# Patient Record
Sex: Male | Born: 1940
Health system: Southern US, Community
[De-identification: ages and names within clinical notes are randomized; demographics above are authoritative.]

## PROBLEM LIST (undated history)

## (undated) DIAGNOSIS — I4430 Unspecified atrioventricular block: Secondary | ICD-10-CM

## (undated) DIAGNOSIS — C61 Malignant neoplasm of prostate: Secondary | ICD-10-CM

## (undated) DIAGNOSIS — F0390 Unspecified dementia without behavioral disturbance: Secondary | ICD-10-CM

## (undated) DIAGNOSIS — R5383 Other fatigue: Secondary | ICD-10-CM

## (undated) DIAGNOSIS — Z8719 Personal history of other diseases of the digestive system: Secondary | ICD-10-CM

## (undated) DIAGNOSIS — R000 Tachycardia, unspecified: Secondary | ICD-10-CM

## (undated) DIAGNOSIS — C7951 Secondary malignant neoplasm of bone: Secondary | ICD-10-CM

## (undated) DIAGNOSIS — K2210 Ulcer of esophagus without bleeding: Secondary | ICD-10-CM

## (undated) DIAGNOSIS — I471 Supraventricular tachycardia: Secondary | ICD-10-CM

## (undated) DIAGNOSIS — I10 Essential (primary) hypertension: Secondary | ICD-10-CM

## (undated) DIAGNOSIS — I452 Bifascicular block: Secondary | ICD-10-CM

## (undated) DIAGNOSIS — F419 Anxiety disorder, unspecified: Secondary | ICD-10-CM

## (undated) DIAGNOSIS — E785 Hyperlipidemia, unspecified: Secondary | ICD-10-CM

## (undated) HISTORY — PX: RESECTION BONE TUMOR RIB: SHX2331

## (undated) HISTORY — DX: Secondary malignant neoplasm of bone: C79.51

## (undated) HISTORY — DX: Hyperlipidemia, unspecified: E78.5

## (undated) HISTORY — DX: Malignant neoplasm of prostate: C61

## (undated) HISTORY — PX: DENTAL SURGERY: SHX609

---

## 2010-11-05 ENCOUNTER — Encounter (INDEPENDENT_AMBULATORY_CARE_PROVIDER_SITE_OTHER): Payer: Medicare Other | Admitting: Thoracic Surgery

## 2010-11-05 DIAGNOSIS — D480 Neoplasm of uncertain behavior of bone and articular cartilage: Secondary | ICD-10-CM

## 2010-11-06 NOTE — Letter (Signed)
November 05, 2010  Selinda Flavin, MD [address] [place], [address] [place], [place] [address]  Re:  [name] [name], [name] [name]             DOB:  [birth year]  Dear Dr. Caryn Bee:  I saw the patient in the office today.  This is a 70 year old Caucasian male, noticed a mass in his right axilla approximately a month ago, and CT scan showed a fourth rib mass that encased the fourth rib with no rib destruction.  It measured 17 x 12 mm and extended into the space.  There was some sclerosis on the rib at that area.  It was thought it could be benign tumor versus a malignant neoplasm.  He has had no weight loss. No excessive sputum.  PAST MEDICAL HISTORY:  Significant for ganglionectomy.  He had previous atrial tachycardia, then had a previous cardiac cath, which showed he has had some mild blockages.  MEDICATIONS:  Aspirin, lisinopril, Zocor, atenolol.  Penicillin causes him to have a rash.  FAMILY HISTORY:  Noncontributory.  SOCIAL HISTORY:  He is married.  He has two children.  Quit smoking in 1976.  Does not drink alcohol on a regular basis.  REVIEW OF SYSTEMS:  GENERAL:  He is 162 pounds.  He is 5 feet 8 inches. He has had some mild weight loss and loss of appetite. CARDIAC:  See history of present illness. PULMONARY:  He has had a cough. GI:  He has had no abdominal pain, dysphagia, or reflux. GU:  He has had frequent urination.  No kidney disease. VASCULAR:  No claudication, DVT, TIAs. NEUROLOGIC:  No dizziness, headaches, blackouts, seizures. MUSCULOSKELETAL:  No arthritis. PSYCHIATRIC:  No depression or nervousness. EYE/ENT:  No changes in eyesight or hearing. HEMATOLOGIC:  No problems with bleeding, clotting disorders, or anemia.  PHYSICAL EXAMINATION:  GENERAL:  He is a thin Caucasian male in no acute distress.  VITAL SIGNS:  Blood pressure 102/66, pulse 84, respirations 18, saturation 96%.  HEAD, EYES, EARS, NOSE, AND THROAT:  Unremarkable. NECK:  Supple without thyromegaly.  There is no  supraclavicular axillary adenopathy.  CHEST:  Clear to auscultation and percussion.  HEART: Regular sinus rhythm.  No murmurs.  ABDOMEN:  Soft.  There is no hepatosplenomegaly.  On examination of his chest and his right arm, there is a palpable 2-3 cm mass at the anterior axillary line on the fourth rib.  I have discussed this with the patient and although we could do a needle biopsy of this it is obviously that this is already through the chest wall and grown in to the pleura, and I would just recommend resection of this and probably require partial resection of his third, fourth, and fifth ribs and then reconstruction with an allograft.  He is going to decide if he wants to have this done and gives a call back.  I appreciate the opportunity of seeing this patient.  Ines Bloomer, M.D. Electronically Signed  DPB/MEDQ  D:  11/05/2010  T:  11/06/2010  Job:  [id number]

## 2010-11-11 ENCOUNTER — Other Ambulatory Visit: Payer: Self-pay | Admitting: Thoracic Surgery

## 2010-11-11 ENCOUNTER — Encounter (HOSPITAL_COMMUNITY)
Admission: RE | Admit: 2010-11-11 | Discharge: 2010-11-11 | Disposition: A | Discharge status: Home / Self Care | Payer: Medicare Other | Source: Ambulatory Visit | Attending: Thoracic Surgery | Admitting: Thoracic Surgery

## 2010-11-11 ENCOUNTER — Ambulatory Visit (HOSPITAL_COMMUNITY)
Admission: RE | Admit: 2010-11-11 | Discharge: 2010-11-11 | Disposition: A | Discharge status: Home / Self Care | Payer: Medicare Other | Source: Ambulatory Visit | Attending: Thoracic Surgery | Admitting: Thoracic Surgery

## 2010-11-11 ENCOUNTER — Ambulatory Visit (INDEPENDENT_AMBULATORY_CARE_PROVIDER_SITE_OTHER): Payer: Medicare Other | Admitting: Thoracic Surgery

## 2010-11-11 DIAGNOSIS — D492 Neoplasm of unspecified behavior of bone, soft tissue, and skin: Secondary | ICD-10-CM

## 2010-11-11 DIAGNOSIS — Z01812 Encounter for preprocedural laboratory examination: Secondary | ICD-10-CM | POA: Insufficient documentation

## 2010-11-11 DIAGNOSIS — Z01818 Encounter for other preprocedural examination: Secondary | ICD-10-CM | POA: Insufficient documentation

## 2010-11-11 DIAGNOSIS — Z01810 Encounter for preprocedural cardiovascular examination: Secondary | ICD-10-CM | POA: Insufficient documentation

## 2010-11-11 DIAGNOSIS — D167 Benign neoplasm of ribs, sternum and clavicle: Secondary | ICD-10-CM

## 2010-11-11 LAB — CBC
HCT: 38 % — ABNORMAL LOW (ref 39.0–52.0)
Platelets: 251 10*3/uL (ref 150–400)
RBC: 4.36 MIL/uL (ref 4.22–5.81)
RDW: 13.2 % (ref 11.5–15.5)
WBC: 8.5 10*3/uL (ref 4.0–10.5)

## 2010-11-11 LAB — COMPREHENSIVE METABOLIC PANEL
AST: 22 U/L (ref 0–37)
CO2: 22 mEq/L (ref 19–32)
Calcium: 9.8 mg/dL (ref 8.4–10.5)
Chloride: 102 mEq/L (ref 96–112)
Creatinine, Ser: 0.72 mg/dL (ref 0.50–1.35)
GFR calc Af Amer: >60 mL/min (ref >60)
GFR calc non Af Amer: >60 mL/min (ref >60)
Glucose, Bld: 92 mg/dL (ref 70–99)
Total Bilirubin: 0.5 mg/dL (ref 0.3–1.2)

## 2010-11-11 LAB — URINALYSIS, ROUTINE W REFLEX MICROSCOPIC
Glucose, UA: NEGATIVE mg/dL
Ketones, ur: NEGATIVE mg/dL
Leukocytes, UA: NEGATIVE
Nitrite: NEGATIVE
Protein, ur: NEGATIVE mg/dL
Urobilinogen, UA: 0.2 mg/dL (ref 0.0–1.0)

## 2010-11-11 LAB — BLOOD GAS, ARTERIAL
Drawn by: 206361
TCO2: 26.5 mmol/L (ref 0–100)
pCO2 arterial: 37.5 mmHg (ref 35.0–45.0)
pH, Arterial: 7.445 (ref 7.350–7.450)

## 2010-11-11 LAB — ABO/RH: ABO/RH(D): O POS

## 2010-11-11 LAB — SURGICAL PCR SCREEN: Staphylococcus aureus: POSITIVE — AB

## 2010-11-11 LAB — PROTIME-INR: Prothrombin Time: 12.8 seconds (ref 11.6–15.2)

## 2010-11-12 NOTE — Assessment & Plan Note (Signed)
OFFICE VISIT  [name] [name], [name] [name]. DOB:  1940-10-14                                        November 11, 2010 CHART #:  [id number]  [name] [name] returned today.  We have him scheduled for resection of the chest wall tumor on Thursday.  He was seen here  because of right ingrown toenail and he does have some mild drainage and some tenderness over his right first toe on the lateral aspect.  No cellulitis and no frank pus in that area.  I discussed it with he and his family.  We will just go ahead and remove the ingrown toenail at the time of his regular surgery.  His blood pressure is 101/66, pulse 58, respirations 16, sats were 93%.  Ines Bloomer, M.D. Electronically Signed  DPB/MEDQ  D:  11/11/2010  T:  11/12/2010  Job:  [id number]

## 2010-11-13 ENCOUNTER — Inpatient Hospital Stay (HOSPITAL_COMMUNITY)
Admission: RE | Admit: 2010-11-13 | Discharge: 2010-11-19 | DRG: 517 | Disposition: A | Discharge status: Home / Self Care | Payer: Medicare Other | Source: Ambulatory Visit | Attending: Thoracic Surgery | Admitting: Thoracic Surgery

## 2010-11-13 ENCOUNTER — Other Ambulatory Visit: Payer: Self-pay | Admitting: Thoracic Surgery

## 2010-11-13 ENCOUNTER — Inpatient Hospital Stay (HOSPITAL_COMMUNITY): Payer: Medicare Other

## 2010-11-13 DIAGNOSIS — C61 Malignant neoplasm of prostate: Secondary | ICD-10-CM | POA: Diagnosis present

## 2010-11-13 DIAGNOSIS — L600 Ingrowing nail: Secondary | ICD-10-CM

## 2010-11-13 DIAGNOSIS — C7951 Secondary malignant neoplasm of bone: Secondary | ICD-10-CM | POA: Insufficient documentation

## 2010-11-13 DIAGNOSIS — Z7982 Long term (current) use of aspirin: Secondary | ICD-10-CM

## 2010-11-13 DIAGNOSIS — E785 Hyperlipidemia, unspecified: Secondary | ICD-10-CM | POA: Diagnosis present

## 2010-11-13 DIAGNOSIS — I2510 Atherosclerotic heart disease of native coronary artery without angina pectoris: Secondary | ICD-10-CM | POA: Diagnosis present

## 2010-11-13 DIAGNOSIS — C7952 Secondary malignant neoplasm of bone marrow: Principal | ICD-10-CM | POA: Diagnosis present

## 2010-11-13 DIAGNOSIS — C413 Malignant neoplasm of ribs, sternum and clavicle: Secondary | ICD-10-CM

## 2010-11-13 DIAGNOSIS — Z79899 Other long term (current) drug therapy: Secondary | ICD-10-CM

## 2010-11-13 DIAGNOSIS — I10 Essential (primary) hypertension: Secondary | ICD-10-CM | POA: Diagnosis present

## 2010-11-13 HISTORY — DX: Essential (primary) hypertension: I10

## 2010-11-13 HISTORY — DX: Secondary malignant neoplasm of bone: C79.51

## 2010-11-14 ENCOUNTER — Inpatient Hospital Stay (HOSPITAL_COMMUNITY): Payer: Medicare Other

## 2010-11-14 LAB — POCT I-STAT 3, ART BLOOD GAS (G3+)
Acid-Base Excess: 1 mmol/L (ref 0.0–2.0)
Bicarbonate: 26.9 mEq/L — ABNORMAL HIGH (ref 20.0–24.0)
O2 Saturation: 92 %
Patient temperature: 98
pO2, Arterial: 64 mmHg — ABNORMAL LOW (ref 80.0–100.0)

## 2010-11-14 LAB — BASIC METABOLIC PANEL
BUN: 9 mg/dL (ref 6–23)
Calcium: 8.2 mg/dL — ABNORMAL LOW (ref 8.4–10.5)
GFR calc Af Amer: >60 mL/min (ref >60)
GFR calc non Af Amer: >60 mL/min (ref >60)
Glucose, Bld: 128 mg/dL — ABNORMAL HIGH (ref 70–99)

## 2010-11-14 LAB — CBC
HCT: 37 % — ABNORMAL LOW (ref 39.0–52.0)
Hemoglobin: 12.4 g/dL — ABNORMAL LOW (ref 13.0–17.0)
MCH: 29.7 pg (ref 26.0–34.0)
MCHC: 33.5 g/dL (ref 30.0–36.0)
RDW: 13.6 % (ref 11.5–15.5)

## 2010-11-14 LAB — TYPE AND SCREEN
ABO/RH(D): O POS
Unit division: 0

## 2010-11-15 ENCOUNTER — Inpatient Hospital Stay (HOSPITAL_COMMUNITY): Payer: Medicare Other

## 2010-11-15 LAB — CBC
MCH: 29.8 pg (ref 26.0–34.0)
MCHC: 33.6 g/dL (ref 30.0–36.0)
Platelets: 193 10*3/uL (ref 150–400)
RDW: 13.4 % (ref 11.5–15.5)

## 2010-11-15 LAB — COMPREHENSIVE METABOLIC PANEL
ALT: 16 U/L (ref 0–53)
AST: 31 U/L (ref 0–37)
Albumin: 2.7 g/dL — ABNORMAL LOW (ref 3.5–5.2)
Calcium: 6.8 mg/dL — ABNORMAL LOW (ref 8.4–10.5)
GFR calc Af Amer: >60 mL/min (ref >60)
Potassium: 5.7 mEq/L — ABNORMAL HIGH (ref 3.5–5.1)
Sodium: 133 mEq/L — ABNORMAL LOW (ref 135–145)
Total Protein: 5.6 g/dL — ABNORMAL LOW (ref 6.0–8.3)

## 2010-11-16 ENCOUNTER — Inpatient Hospital Stay (HOSPITAL_COMMUNITY): Payer: Medicare Other

## 2010-11-16 LAB — BASIC METABOLIC PANEL
BUN: 16 mg/dL (ref 6–23)
Creatinine, Ser: 0.92 mg/dL (ref 0.50–1.35)
GFR calc non Af Amer: >60 mL/min (ref >60)
Glucose, Bld: 126 mg/dL — ABNORMAL HIGH (ref 70–99)
Potassium: 4.1 mEq/L (ref 3.5–5.1)

## 2010-11-16 LAB — CBC
HCT: 36.3 % — ABNORMAL LOW (ref 39.0–52.0)
Hemoglobin: 12.3 g/dL — ABNORMAL LOW (ref 13.0–17.0)
MCH: 29.6 pg (ref 26.0–34.0)
MCHC: 33.9 g/dL (ref 30.0–36.0)
MCV: 87.5 fL (ref 78.0–100.0)

## 2010-11-17 ENCOUNTER — Inpatient Hospital Stay (HOSPITAL_COMMUNITY): Payer: Medicare Other

## 2010-11-17 LAB — CBC
HCT: 35.5 % — ABNORMAL LOW (ref 39.0–52.0)
Hemoglobin: 12 g/dL — ABNORMAL LOW (ref 13.0–17.0)
MCH: 29.6 pg (ref 26.0–34.0)
MCHC: 33.8 g/dL (ref 30.0–36.0)

## 2010-11-17 LAB — PSA: PSA: 187.4 ng/mL — ABNORMAL HIGH (ref <=4.00)

## 2010-11-18 ENCOUNTER — Inpatient Hospital Stay (HOSPITAL_COMMUNITY): Payer: Medicare Other

## 2010-11-18 LAB — CBC
Hemoglobin: 12 g/dL — ABNORMAL LOW (ref 13.0–17.0)
MCH: 29.4 pg (ref 26.0–34.0)
RBC: 4.08 MIL/uL — ABNORMAL LOW (ref 4.22–5.81)
WBC: 15 10*3/uL — ABNORMAL HIGH (ref 4.0–10.5)

## 2010-11-18 LAB — GLUCOSE, CAPILLARY: Glucose-Capillary: 99 mg/dL (ref 70–99)

## 2010-11-18 NOTE — Op Note (Signed)
NAMECOURT, [name] NO.:  [id number]  MEDICAL RECORD NO.:  [id number]  LOCATION:  2316                         FACILITY:  [facility]  PHYSICIAN:  [name] [name], M.D. DATE OF BIRTH:  [birth year]  DATE OF PROCEDURE: DATE OF DISCHARGE:                              OPERATIVE REPORT   PREOPERATIVE DIAGNOSIS:  Right fourth rib tumor.  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  OPERATION PERFORMED:  Right video-assisted thoracic surgery, resection of third, fourth and fifth ribs with reconstruction with allograft and titanium plates.  SURGEON:  [name] Bloomer, MD  FIRST ASSISTANT:  [name] Clack, PA-C  ANESTHESIA:  General anesthesia.  After percutaneous insertion of all monitoring lines, the patient underwent general anesthesia, was prepped and draped in usual sterile manner.  He was turned to the right lateral thoracotomy position.  The right lung was deflated.  Two trocar sites were made in the anterior and posterior axons to seventh intercostal space.  Two trocars were inserted.  A 0-degree scope was inserted and we could see the lesion in the fourth rib at the anterior axillary line.  We marked this and then made incision over the rib approximately 10 cm.  The scope was removed. We then dissected down to the subcutaneous tissue and elevated superior and inferior flap with electrocautery.  The latissimus was preserved but the serratus muscle was over the tumor and we resected it portion of the muscle inferiorly and superiorly, medially and laterally.  We then dissected up to the third rib and dissected out a portion of the third rib of approximately 3-5 cm.  The fourth rib we dissected an area of approximately 7-8 cm and the same with the fifth rib going into the intercostal space above the third rib and intercostal space below the fourth and fifth rib.  We then resected the ribs medially and laterally leaving a gross  2 cm margin on all areas of the rib.  There was an en bloc resection that was sent for frozen section, was thought to be some type of a possible neurogenic tumor margins.  We then began reconstruction up in the chest tube through the anterior trocar site and tied in place with 0 silk.  We then took a XCM biological allograft and cut it appropriately to be approximately 10 cm in length and approximately 6-7 cm in width.  We then sutured that to the intercostal muscle superiorly and inferiorly and medially and laterally with interrupted horizontal mattress 2-0 Prolene sutures.  After this had been done and the allograft was in place, we then placed on the third rib, we used a Universal plate and 8 mm screws.  We put the plate on to expand the gap and there were 3 screw holes medially and 2 laterally. We held it in place with a lobster claw plate holder and then used to drill the ribs and then used the 8 screw with the power screwdriver.  We did 3 medially and 2 laterally.  On the 2 laterally we were somewhat concerned as it was not a good enough fixation, so we put another stitch of a #5 FiberWire on it and  tied that in place to further hold the plate to the rib.  On the fourth rib, we used a 4 or 5 rib plate and used a 10 blade to cut so it will be 3-4 holes medially and 3 holes laterally. The rib was then cut, again took #8 screws.  We held the rib in place with the lobster claw plate holders.  We then in like manner put 4 screws medially and 3 screws laterally as previously described.  Then we sutured the allograft to the plates with interrupted 2-0 Prolene above for the third and the fourth plates.  Finally we used the fifth rib plate and it with a longest.  We had to use approximately 10 cm again using 3 holes medially and 3 holes laterally.  We measured it and held in place with plate holders and then used the drill guide to drill the holes and then the power screwdrivers to put in #8  screws.  After this had been done, we again sutured the allograft to the plate and then closed the chest by resuturing the serratus with interrupted 0 Prolene, 2-0 Prolene in the subcutaneous tissue and Dermabond for the skin.  The other trocar site was closed with 2-0 Vicryl and Dermabond for the skin. A Marcaine block was done in the usual fashion.  A single On-Q was inserted in the usual fashion.  The patient was then turned to the prone position and left first toe was prepped.  The patient had an infected ingrown toenail in this left first toe causing considerable pain.  After prepping and draping this in a sterile field, we elevated the ingrown toenail from the cuticle and removed approximately one-fourth of the toenail to remove the ingrown toenail.  There was some pus coming out of this and we debrided that.  We then irrigated it copiously and applied a dry dressing.     [name] [name], M.D.     DPB/MEDQ  D:  11/13/2010  T:  11/14/2010  Job:  [id number]  Electronically Signed by Jovita Gamma M.D. on 11/18/2010 04:36:20 PM

## 2010-11-18 NOTE — H&P (Addendum)
NAME:  [name] [name], [name] [name] NO.:  [id number]  MEDICAL RECORD NO.:  [id number]  LOCATION:                                 FACILITY:  PHYSICIAN:  Ines Bloomer, M.D. DATE OF BIRTH:  1940-07-17  DATE OF ADMISSION: DATE OF DISCHARGE:                             HISTORY & PHYSICAL   CHIEF COMPLAINT:  Rib mass.  This 70 year old patient was found to have a mass on his rib palpable in the right axilla near the fourth rib mass that has encased the fourth rib with some sclerosis, but no destruction, it is 17 x 12 mm and extending into the pulmonary space.  As mentioned, there were some sclerosis of the rib.  He noticed this approximately a month ago, it is nontender.  He is being admitted for possible resection.  PAST MEDICAL HISTORY:  Significant for: 1. Right ganglionectomy. 2. He had some atrial tachycardia and had a previous cath by his     cardiologist in [place]. He also has a right first toe ingrown toenail.MEDICATIONS:  Aspirin, lisinopril, Zocor, and atenolol.  ALLERGIES:  PENICILLIN causes him to have a rash.  FAMILY HISTORY:  Noncontributory.  SOCIAL HISTORY:  He is married, has 2 children.  Quit smoking in 1976. Does not drink alcohol on a regular basis.  REVIEW OF SYSTEMS:  CONSTITUTIONAL:  He is 162 pounds.  He is 5 feet 8. He has had some mild weight loss and loss of appetite.  CARDIAC:  See past medical history.  PULMONARY:  He has a cough.  No hemoptysis.  GI: No abdominal pain, dysphagia, or reflux.  GU:  No frequent urination. No kidney disease.  VASCULAR:  No claudication, DVT, TIAs.  NEUROLOGIC: No dizziness, headaches, blackouts, seizures.  MUSCULOSKELETAL:  No arthritis or muscular pain but does have the right first ingrown toenail which he is soaking.  PSYCHIATRIC:  No depression or nervous.  ENT:  No changes in eyesight or hearing.  HEMATOLOGIC:  No problems with bleeding, clotting disorders, or anemia.  PHYSICAL EXAMINATION:  GENERAL:   This is a thin Caucasian male in no acute distress. VITAL SIGNS:  His blood pressure is 102/66, pulse 84, respirations 18, sats were 96%. HEENT:  Head is atraumatic.  Eyes, pupils are equal and reactive to light and accommodation.  Extraocular movements are normal.  Ears, tympanic membranes intact.  Nose, there is no septal deviation. NECK:  Supple without thyromegaly.  There is no supraclavicular or axillary adenopathy. CHEST:  Clear to auscultation and percussion.  Nonpalpable in the right axilla.  You can palpate the anterior axillary line and you can palpate the nodule that is nontender. HEART:  Regular sinus rhythm.  No murmurs. ABDOMEN:  Soft.  There is no hepatosplenomegaly. EXTREMITIES:  Pulses are 2+.  There is no clubbing or edema. NEUROLOGIC:  He is oriented x3.  Sensory and motor intact.  Cranial nerves intact.  IMPRESSION: 1. Right fourth rib, rule out malignant tumor. 2. History of atrial tachycardias. 3. Hypertension. 4. Dyslipidemia. 5. Right ingrown toenail.  PLAN:  Partial resection of right fourth rib with reconstruction with allograft, possible resection of third and fifth ribs, and repair of ingrown toenail.  Ines Bloomer, M.D.     DPB/MEDQ  D:  11/11/2010  T:  11/12/2010  Job:  [id number]  Electronically Signed by Jovita Gamma M.D. on 11/18/2010 04:37:30 PM

## 2010-11-19 ENCOUNTER — Encounter (HOSPITAL_COMMUNITY): Payer: Self-pay | Admitting: Radiology

## 2010-11-19 ENCOUNTER — Inpatient Hospital Stay (HOSPITAL_COMMUNITY): Payer: Medicare Other

## 2010-11-19 MED ORDER — IOHEXOL 300 MG/ML  SOLN
100.0000 mL | Freq: Once | INTRAMUSCULAR | Status: AC | PRN
  Administered 2010-11-19: 100 mL via INTRAVENOUS

## 2010-11-19 MED ORDER — TECHNETIUM TC 99M MEDRONATE IV KIT
25.0000 | PACK | Freq: Once | INTRAVENOUS | Status: AC | PRN
  Administered 2010-11-19: 25 via INTRAVENOUS

## 2010-11-24 ENCOUNTER — Other Ambulatory Visit: Payer: Self-pay | Admitting: Thoracic Surgery

## 2010-11-24 DIAGNOSIS — D381 Neoplasm of uncertain behavior of trachea, bronchus and lung: Secondary | ICD-10-CM

## 2010-11-25 ENCOUNTER — Encounter (INDEPENDENT_AMBULATORY_CARE_PROVIDER_SITE_OTHER): Payer: Self-pay | Admitting: Thoracic Surgery

## 2010-11-25 ENCOUNTER — Ambulatory Visit
Admission: RE | Admit: 2010-11-25 | Discharge: 2010-11-25 | Disposition: A | Discharge status: Home / Self Care | Payer: Medicare Other | Source: Ambulatory Visit | Attending: Thoracic Surgery | Admitting: Thoracic Surgery

## 2010-11-25 DIAGNOSIS — C7951 Secondary malignant neoplasm of bone: Secondary | ICD-10-CM

## 2010-11-25 DIAGNOSIS — D381 Neoplasm of uncertain behavior of trachea, bronchus and lung: Secondary | ICD-10-CM

## 2010-11-25 NOTE — Discharge Summary (Signed)
  [name], [name] NO.:  [id number]  MEDICAL RECORD NO.:  [id number]  LOCATION:  2028                         FACILITY:  [facility]  PHYSICIAN:  Ines Bloomer, M.D. DATE OF BIRTH:  28-Dec-1940  DATE OF ADMISSION:  11/13/2010 DATE OF DISCHARGE:                              DISCHARGE SUMMARY   ADDENDUM  This is an addendum to the patient's discharge summary.  On November 18, 2010, Urology had been consulted for evaluation and Dr. Patsi Sears did see and evaluate the patient.  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  Felt that prior to the patient's discharge to home, would need bone scan and CT scan of the chest, abdomen, and pelvis with contrast for further workup.  He did start the patient on Casodex 50 mg p.o. t.i.d.  Felt that once the patient had these studies done stable for discharge from his standpoint and he plans to follow up with the patient in approximately 10 days.  On morning rounds of November 19, 2010, the patient was noted to be afebrile, normal sinus rhythm.  Blood pressure stable.  O2 sats greater than 90% on room air.  Incisions were noted to be clean, dry, and intact and healing well.  Currently, awaiting for the patient to have bone scan as well as CT scan of pelvis, chest, and abdomen.  Once these studies are done, the plan will be for discharge the patient to home in the AM.  Please see the dictated discharge summary for followup appointments and discharge destructions.  DISCHARGE MEDICATIONS:  As dictated with the addition of Casodex 50 mg p.o. t.i.d.     Sol Blazing, PA   ______________________________ Ines Bloomer, M.D.    KMD/MEDQ  D:  11/19/2010  T:  11/19/2010  Job:  [id number]  cc:   Lynelle Smoke I. Patsi Sears, M.D.  Electronically Signed by Cameron Proud PA on 11/19/2010 09:44:33 AM Electronically Signed by Jovita Gamma M.D. on 11/25/2010 02:59:06 PM

## 2010-11-25 NOTE — Discharge Summary (Signed)
[name], [name] NO.:  [id number]  MEDICAL RECORD NO.:  [id number]  LOCATION:  2028                         FACILITY:  [facility]  PHYSICIAN:  [name] Bloomer, M.D. 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  SECONDARY DIAGNOSES: 1. Hypertension. 2. Hyperlipidemia. 3. Status post right ganglionectomy.  IN-HOSPITAL OPERATIONS AND PROCEDURES:  Right video-assisted thorascopic surgery with right thoracotomy for resection of third, fourth and fifth ribs with reconstruction with allograft and titanium plate.  This was done by Dr. Edwyna Shell on November 13, 2010.  HISTORY AND PHYSICAL AND HOSPITAL COURSE:  The patient is a 70 year old male who was found to have a mass on his ribs.  The mass on the fourth rib measure 17 x 12 mm and extending to the pulmonary space.  There was noted to be some sclerosis of the rib.  The patient was seen and evaluated by Dr. Edwyna Shell.  Dr. Edwyna Shell discussed with the patient undergoing resection of the mass.  Discussed risks and benefits with the patient.  The patient nods understanding and agreed to proceed.  Surgery was scheduled for November 13, 2010.  For further details of the patient's past medical history and physical exam, please see dictated H and P.  The patient was taken to the operating room on November 13, 2010, where he underwent right video-assisted thoracoscopic surgery with right thoracotomy for resection of third, fourth and fifth ribs with reconstruction with allograft and titanium plates.  The patient tolerated this procedure and was transferred to the surgical intensive care unit in stable condition.  Postoperatively, the patient was able to be extubated.  Post extubation, he was noted to be alert and oriented x4.  Neuro intact.  He was  noted to be hemodynamically stable postoperatively.  During the patient's postoperative course, daily chest x-rays were obtained.  He was noted to be stable with no pneumothorax with bilateral atelectasis.  The patient had no air leak noted from chest tube and chest tube was able to be discontinued on postop day #2. The patient's most recent chest x-ray on November 18, 2010, showed a right pleural effusion with atelectasis and infiltrative opacities in the lower right lung.  Plan will be to follow up with a PMI chest x-ray in the a.m.  During this time, the patient has been using his incentive spirometer.  He has been weaned off oxygen with O2 saturations maintaining greater than 90% on room air.  The patient's vital signs have been followed closely.  He has remained afebrile in normal sinus rhythm.  Blood pressure has been stable and low.  He has been restarted on his atenolol but has not been restarted on his lisinopril secondary to his blood pressure being too low to restart.  This can be reevaluated as an outpatient.  The patient postoperatively was up ambulating well with assistance.  He was tolerating diet well.  No nausea, vomiting noted.  All incisions were noted to be clean, dry and intact and healing well.  The patient's pathology  results came back showing metastatic poorly differentiated adenocarcinoma prostatic  primary.  The patient is currently scheduled to have a bone scan done today.  PSA obtained was 187.4 on November 17, 2010.  On November 18, 2010, vital signs noted to be stable.  Most recent lab work shows white blood cell count 15.0, hemoglobin of 12.0, hematocrit 35.0, platelet count 272.  Sodium of 134, potassium 4.1, chloride 98, bicarbonate 31, BUN 16, creatinine 0.92, glucose 126.  The patient is tentatively ready for discharge to home in the a.m. November 19, 2010.  FOLLOWUP APPOINTMENTS:  A followup appointment has been arranged with Dr. Edwyna Shell for November 25, 2010, at 2:15  p.m.  The patient will need to obtain PMI chest x-ray 45 minutes prior to this appointment.  ACTIVITY:  The patient instructed no driving until released to do so, no lifting over 10 pounds.  He is told to ambulate 3-4 times per day, progress as tolerated and continue his breathing exercises.  INCISIONAL CARE:  The patient is told to shower washing his incisions using soap and water.  He is to contact the office if he develops any drainage or opening from any of his incision sites.  DIET:  The patient educated on diet to be low-fat, low-salt.  DISCHARGE MEDICATIONS: 1. Guaifenesin 600 mg b.i.d. 2. Percocet 5/325 1-2 tablets q. 4 h. p.r.n. pain. 3. Triamcinolone applied b.i.d. to affected area. 4. Aspirin 81 mg daily. 5. Atenolol 25 mg daily. 6. Simvastatin 80 mg daily.     [name] Blazing, PA   ______________________________ [name] Bloomer, M.D.    KMD/MEDQ  D:  11/18/2010  T:  11/18/2010  Job:  [id number]  Electronically Signed by Cameron Proud PA on 11/19/2010 09:44:19 AM Electronically Signed by Jovita Gamma M.D. on 11/25/2010 02:59:04 PM

## 2010-11-26 NOTE — Letter (Signed)
November 25, 2010  Selinda Flavin, MD [address] [place], [address] [place], [place] [address]  Re:  LENNON, BOUTWELL.             DOB:  [birth year]  Dear Dr. Dimas Aguas:  I saw Mr. Corbit back after his surgery.  We resected the third, fourth, and fifth ribs and reconstruction with titanium plates and an allograft membrane.  His incision is well healed.  He is doing well overall.  He had a small effusion that is now resolving.  The startling finding of this is that this was metastatic prostate cancer.  We got a bone scan on him.  He has other areas of metastatic cancer and probably some lymph nodes.  I had Dr. Jethro Bolus see him, one of our urologists, and he will initiate treatment for prostate cancer.  It is kind of interesting that we came about the diagnosis in a back way, but he appears to be tolerating the procedure well and hopefully will have a long-term suppression with probable hormonal therapy.  I appreciate the opportunity of seeing Mr. Rouillard.  Sincerely,  Ines Bloomer, M.D. Electronically Signed  DPB/MEDQ  D:  11/25/2010  T:  11/26/2010  Job:  [id number]

## 2010-12-09 NOTE — Consult Note (Signed)
[name] [name], [name] NO.:  [id number]  MEDICAL RECORD NO.:  [id number]  LOCATION:  2028                         FACILITY:  [facility]  PHYSICIAN:  Travares Nelles I. Patsi Sears, M.D.DATE OF BIRTH:  December 01, 1940  DATE OF CONSULTATION:  11/18/2010 DATE OF DISCHARGE:                                CONSULTATION   SUBJECTIVE:  This is a 70 year old married male, who noticed a right red mass palpable near the right axilla, at the level of the fourth anterior rib.  The patient was evaluated with a chest x-ray which showed sclerosis, but no bony destruction.  The mass measured 17 mm x 12 mm, and extended into the pulmonary space.  The mass has been present for approximately 1 month and is nontender.  The patient was admitted via the operating room for resection, with pathology showing prostate cancer microscopically.  Urology consultation is obtained.  DFA this hospitalization is 187 nanograms.  Past medical history is significant for atrial tachycardia.  Medications include aspirin, lisinopril, Zocor, and atenolol.  ALLERGIES:  Penicillin (rash).  SURGERY:  The patient is married with 2 children.  No smoking since 1976.  No alcohol again 30 years.  REVIEW OF SYSTEMS:  CONSTITUTIONAL:  Weight 162 pounds.  Some mild weight loss, some loss of appetite.  CARDIAC:  The patient currently is asymptomatic.  PULMONARY:  History of cough but no hemoptysis.  ABDOMEN: No history of GI complaints.  No dysphagia, reflux, or constipation (until taking pain medication).  GU:  The patient denies gross hematuria.  He has mild frequency, urgency, hesitations to void.  He notices occasional urinary incontinence, with nocturia x2 to 4.  He had a good urinary stream without dribbling, but would not generally complain about his urinary stream.  VASCULAR:  The patient has no claudication, no history of DVT.  NEUROLOGIC:  No dizziness, headaches or seizures.  MUSCULOSKELETAL:  There is no history of  arthritis.  No muscular pain.  PSYCHIATRIC:  No depression or anxiety.  EXTREMITIES: No cyanosis.  PHYSICAL EXAMINATION:  GENERAL:  A thin white male in no acute distress. VITAL SIGNS:  Blood pressure 102/66, pulse 84, respiratory rate 18. NECK:  Supple, nontender.  No nodes. CHEST:  Clear to P and A.  He has an incision which is healing well in his right axilla. ABDOMEN:  Soft plus bowel sounds.  No organomegaly or masses. RECTAL:  Normal sphincter tone.  Prostate is 4+ and firm.  There are no hard nodules.  There is no blood. EXTREMITIES:  No cyanosis.  No edema. PSYCHIATRIC:  Normal orientation.  Sensory and motor nerves are intact.  Laboratories show PSA 187.  Bone scan CT pending.  ASSESSMENT:  Metastatic carcinoma of the prostate with very high PSA. The patient will need to have an evaluation in the office.  In this unusual prostate cancer presentation, workup may be "backwards."  The patient needs to have bone scan and CT while in the hospital, and then I will follow him up in the office.  He will probably need prostate biopsy for Gleason scoring, then hormone therapy in all probability.  I have discussed the situation with him, and I have given him my  phone number to make an appointment with me.  We will see the patient in approximately 10 days.  He is stable for discharge after x- ray is done from urologic standpoint.  He voids well.     [name] I. Patsi Sears, M.D.     SIT/MEDQ  D:  11/18/2010  T:  11/19/2010  Job:  [id number]  cc:   Ines Bloomer, M.D. Laural Golden  Electronically Signed by Jethro Bolus M.D. on 12/09/2010 05:01:16 PM

## 2010-12-17 ENCOUNTER — Other Ambulatory Visit: Payer: Self-pay | Admitting: Thoracic Surgery

## 2010-12-17 DIAGNOSIS — D381 Neoplasm of uncertain behavior of trachea, bronchus and lung: Secondary | ICD-10-CM

## 2010-12-19 DIAGNOSIS — C61 Malignant neoplasm of prostate: Secondary | ICD-10-CM | POA: Insufficient documentation

## 2010-12-19 DIAGNOSIS — I10 Essential (primary) hypertension: Secondary | ICD-10-CM | POA: Insufficient documentation

## 2010-12-19 DIAGNOSIS — E785 Hyperlipidemia, unspecified: Secondary | ICD-10-CM | POA: Insufficient documentation

## 2010-12-19 DIAGNOSIS — C7951 Secondary malignant neoplasm of bone: Secondary | ICD-10-CM

## 2010-12-23 ENCOUNTER — Encounter: Payer: Self-pay | Admitting: Thoracic Surgery

## 2010-12-23 ENCOUNTER — Ambulatory Visit
Admission: RE | Admit: 2010-12-23 | Discharge: 2010-12-23 | Disposition: A | Discharge status: Home / Self Care | Payer: Medicare Other | Source: Ambulatory Visit | Attending: Thoracic Surgery | Admitting: Thoracic Surgery

## 2010-12-23 ENCOUNTER — Ambulatory Visit (INDEPENDENT_AMBULATORY_CARE_PROVIDER_SITE_OTHER): Payer: Self-pay | Admitting: Thoracic Surgery

## 2010-12-23 VITALS — BP 102/68 | HR 88 | Resp 20 | Ht 68.0 in | Wt 163.0 lb

## 2010-12-23 DIAGNOSIS — C419 Malignant neoplasm of bone and articular cartilage, unspecified: Secondary | ICD-10-CM

## 2010-12-23 DIAGNOSIS — D381 Neoplasm of uncertain behavior of trachea, bronchus and lung: Secondary | ICD-10-CM

## 2010-12-23 DIAGNOSIS — Z09 Encounter for follow-up examination after completed treatment for conditions other than malignant neoplasm: Secondary | ICD-10-CM

## 2010-12-23 NOTE — Progress Notes (Signed)
HPI the patient returns after chest wall resection for prostate cancer. Chest x-ray shows that the rib plates were in good position. His incision is well-healed. He has some numbness but minimal pain. He is doing well overall. Dr. Patsi Sears is following him for his prostate cancer.   Current Outpatient Prescriptions  Medication Sig Dispense Refill  . aspirin 81 MG tablet Take 81 mg by mouth daily.        Marland Kitchen atenolol (TENORMIN) 25 MG tablet Take 25 mg by mouth daily.        . bicalutamide (CASODEX) 50 MG tablet Take 50 mg by mouth 3 (three) times daily after meals.        Marland Kitchen oxyCODONE-acetaminophen (PERCOCET) 5-325 MG per tablet Take 1 tablet by mouth every 4 (four) hours as needed.        . simvastatin (ZOCOR) 80 MG tablet Take 80 mg by mouth at bedtime.            Physical Exam  Cardiovascular: Normal rate, regular rhythm and normal heart sounds.   Pulmonary/Chest: Effort normal and breath sounds normal.     Diagnostic tests: Chest x-ray shows normal postoperative changes.   Impression: Status post chest wall resection for prostate cancer  Plan: Return to office in 3 months.

## 2011-03-18 ENCOUNTER — Other Ambulatory Visit: Payer: Self-pay | Admitting: Thoracic Surgery

## 2011-03-18 DIAGNOSIS — Z09 Encounter for follow-up examination after completed treatment for conditions other than malignant neoplasm: Secondary | ICD-10-CM

## 2011-03-18 DIAGNOSIS — C419 Malignant neoplasm of bone and articular cartilage, unspecified: Secondary | ICD-10-CM

## 2011-03-24 ENCOUNTER — Ambulatory Visit (INDEPENDENT_AMBULATORY_CARE_PROVIDER_SITE_OTHER): Payer: Medicare Other | Admitting: Thoracic Surgery

## 2011-03-24 ENCOUNTER — Ambulatory Visit
Admission: RE | Admit: 2011-03-24 | Discharge: 2011-03-24 | Disposition: A | Discharge status: Home / Self Care | Payer: Medicare Other | Source: Ambulatory Visit | Attending: Thoracic Surgery | Admitting: Thoracic Surgery

## 2011-03-24 ENCOUNTER — Encounter: Payer: Self-pay | Admitting: Thoracic Surgery

## 2011-03-24 VITALS — BP 111/72 | HR 82 | Resp 16 | Ht 68.0 in | Wt 165.0 lb

## 2011-03-24 DIAGNOSIS — C419 Malignant neoplasm of bone and articular cartilage, unspecified: Secondary | ICD-10-CM

## 2011-03-24 NOTE — Progress Notes (Signed)
HPI patient returns today for followup. He is being followed by Dr. Patsi Sears. Chest x-ray shows it is plates are in good position. 1 or 2 screws had backed out. He is now on for 6 months since we did the chest wall resection and reconstruction. I will see him again in 4 months and repeat his CT scan. If that is stable then I'll refer him to Dr. Patsi Sears for long-term followup.   Current Outpatient Prescriptions  Medication Sig Dispense Refill  . aspirin 81 MG tablet Take 81 mg by mouth daily.        Marland Kitchen atenolol (TENORMIN) 25 MG tablet Take 25 mg by mouth daily.        . bicalutamide (CASODEX) 50 MG tablet Take 50 mg by mouth 3 (three) times daily after meals.        . simvastatin (ZOCOR) 80 MG tablet Take 80 mg by mouth at bedtime.        Marland Kitchen oxyCODONE-acetaminophen (PERCOCET) 5-325 MG per tablet Take 1 tablet by mouth every 4 (four) hours as needed.           Review of Systems: Unchanged   Physical Exam lungs were clear to auscultation percussion  Diagnostic Tests: Chest x-ray shows the plates are in good position 2 screws have back down slightly   Impression: Status post chest wall resection right third fourth and fifth ribs for metastatic prostate cancer.   Plan: Followup in 4 months with CT scan

## 2011-04-22 DIAGNOSIS — C61 Malignant neoplasm of prostate: Secondary | ICD-10-CM | POA: Diagnosis not present

## 2011-06-10 DIAGNOSIS — I2510 Atherosclerotic heart disease of native coronary artery without angina pectoris: Secondary | ICD-10-CM | POA: Diagnosis not present

## 2011-06-10 DIAGNOSIS — E785 Hyperlipidemia, unspecified: Secondary | ICD-10-CM | POA: Diagnosis not present

## 2011-06-18 DIAGNOSIS — C7951 Secondary malignant neoplasm of bone: Secondary | ICD-10-CM | POA: Diagnosis not present

## 2011-06-18 DIAGNOSIS — C61 Malignant neoplasm of prostate: Secondary | ICD-10-CM | POA: Diagnosis not present

## 2011-06-24 ENCOUNTER — Other Ambulatory Visit: Payer: Self-pay | Admitting: Thoracic Surgery

## 2011-06-24 DIAGNOSIS — D381 Neoplasm of uncertain behavior of trachea, bronchus and lung: Secondary | ICD-10-CM

## 2011-06-25 DIAGNOSIS — C61 Malignant neoplasm of prostate: Secondary | ICD-10-CM | POA: Diagnosis not present

## 2011-06-25 DIAGNOSIS — C7952 Secondary malignant neoplasm of bone marrow: Secondary | ICD-10-CM | POA: Diagnosis not present

## 2011-06-25 DIAGNOSIS — C7951 Secondary malignant neoplasm of bone: Secondary | ICD-10-CM | POA: Diagnosis not present

## 2011-07-23 DIAGNOSIS — C7952 Secondary malignant neoplasm of bone marrow: Secondary | ICD-10-CM | POA: Diagnosis not present

## 2011-07-23 DIAGNOSIS — C7951 Secondary malignant neoplasm of bone: Secondary | ICD-10-CM | POA: Diagnosis not present

## 2011-08-04 ENCOUNTER — Ambulatory Visit (INDEPENDENT_AMBULATORY_CARE_PROVIDER_SITE_OTHER): Payer: Medicare Other | Admitting: Thoracic Surgery

## 2011-08-04 ENCOUNTER — Ambulatory Visit
Admission: RE | Admit: 2011-08-04 | Discharge: 2011-08-04 | Disposition: A | Discharge status: Home / Self Care | Payer: Medicare Other | Source: Ambulatory Visit | Attending: Thoracic Surgery | Admitting: Thoracic Surgery

## 2011-08-04 ENCOUNTER — Encounter: Payer: Self-pay | Admitting: Thoracic Surgery

## 2011-08-04 VITALS — BP 109/70 | HR 85 | Resp 16 | Ht 68.0 in | Wt 170.0 lb

## 2011-08-04 DIAGNOSIS — Z09 Encounter for follow-up examination after completed treatment for conditions other than malignant neoplasm: Secondary | ICD-10-CM

## 2011-08-04 DIAGNOSIS — D381 Neoplasm of uncertain behavior of trachea, bronchus and lung: Secondary | ICD-10-CM

## 2011-08-04 DIAGNOSIS — J438 Other emphysema: Secondary | ICD-10-CM | POA: Diagnosis not present

## 2011-08-04 DIAGNOSIS — C413 Malignant neoplasm of ribs, sternum and clavicle: Secondary | ICD-10-CM

## 2011-08-04 DIAGNOSIS — J984 Other disorders of lung: Secondary | ICD-10-CM | POA: Diagnosis not present

## 2011-08-04 NOTE — Progress Notes (Signed)
HPI patient returns for followup CT scan showed no evidence of recurrence of his metastatic prostate chest wall mass. His incision is well healed and 90 the rib plates were in good position. He's been followed by Dr. Patsi Sears. We will see him back again as needed   Current Outpatient Prescriptions  Medication Sig Dispense Refill  . amitriptyline (ELAVIL) 25 MG tablet Take 25 mg by mouth at bedtime.      Marland Kitchen aspirin 81 MG tablet Take 81 mg by mouth daily.        Marland Kitchen atenolol (TENORMIN) 25 MG tablet Take 25 mg by mouth daily.        . bicalutamide (CASODEX) 50 MG tablet Take 50 mg by mouth 3 (three) times daily after meals.        . simvastatin (ZOCOR) 80 MG tablet Take 80 mg by mouth at bedtime.           Review of Systems: Unchanged   Physical Exam lungs are clear to auscultation percussion   Diagnostic Tests: CT scan showed a recurrent no recurrence of his chest wall metastatic prostate cancer   Impression: Metastatic prostate cancer to the chest chest status post resection and reconstruction   Plan: Return as needed

## 2011-08-20 DIAGNOSIS — C61 Malignant neoplasm of prostate: Secondary | ICD-10-CM | POA: Diagnosis not present

## 2011-08-26 DIAGNOSIS — C61 Malignant neoplasm of prostate: Secondary | ICD-10-CM | POA: Diagnosis not present

## 2011-08-26 DIAGNOSIS — C7951 Secondary malignant neoplasm of bone: Secondary | ICD-10-CM | POA: Diagnosis not present

## 2011-09-24 DIAGNOSIS — C7952 Secondary malignant neoplasm of bone marrow: Secondary | ICD-10-CM | POA: Diagnosis not present

## 2011-09-24 DIAGNOSIS — C7951 Secondary malignant neoplasm of bone: Secondary | ICD-10-CM | POA: Diagnosis not present

## 2011-09-24 DIAGNOSIS — C61 Malignant neoplasm of prostate: Secondary | ICD-10-CM | POA: Diagnosis not present

## 2011-10-23 DIAGNOSIS — C61 Malignant neoplasm of prostate: Secondary | ICD-10-CM | POA: Diagnosis not present

## 2011-10-23 DIAGNOSIS — C7951 Secondary malignant neoplasm of bone: Secondary | ICD-10-CM | POA: Diagnosis not present

## 2011-11-16 DIAGNOSIS — C61 Malignant neoplasm of prostate: Secondary | ICD-10-CM | POA: Diagnosis not present

## 2011-11-20 DIAGNOSIS — C61 Malignant neoplasm of prostate: Secondary | ICD-10-CM | POA: Diagnosis not present

## 2011-11-20 DIAGNOSIS — C7952 Secondary malignant neoplasm of bone marrow: Secondary | ICD-10-CM | POA: Diagnosis not present

## 2011-11-20 DIAGNOSIS — C7951 Secondary malignant neoplasm of bone: Secondary | ICD-10-CM | POA: Diagnosis not present

## 2011-12-22 DIAGNOSIS — C61 Malignant neoplasm of prostate: Secondary | ICD-10-CM | POA: Diagnosis not present

## 2011-12-22 DIAGNOSIS — C7951 Secondary malignant neoplasm of bone: Secondary | ICD-10-CM | POA: Diagnosis not present

## 2011-12-22 DIAGNOSIS — C7952 Secondary malignant neoplasm of bone marrow: Secondary | ICD-10-CM | POA: Diagnosis not present

## 2012-01-19 DIAGNOSIS — C7952 Secondary malignant neoplasm of bone marrow: Secondary | ICD-10-CM | POA: Diagnosis not present

## 2012-01-19 DIAGNOSIS — C7951 Secondary malignant neoplasm of bone: Secondary | ICD-10-CM | POA: Diagnosis not present

## 2012-02-23 DIAGNOSIS — C7951 Secondary malignant neoplasm of bone: Secondary | ICD-10-CM | POA: Diagnosis not present

## 2012-02-23 DIAGNOSIS — C7952 Secondary malignant neoplasm of bone marrow: Secondary | ICD-10-CM | POA: Diagnosis not present

## 2012-02-23 DIAGNOSIS — C61 Malignant neoplasm of prostate: Secondary | ICD-10-CM | POA: Diagnosis not present

## 2012-04-18 DIAGNOSIS — L03221 Cellulitis of neck: Secondary | ICD-10-CM | POA: Diagnosis not present

## 2012-04-18 DIAGNOSIS — L0211 Cutaneous abscess of neck: Secondary | ICD-10-CM | POA: Diagnosis not present

## 2012-04-21 DIAGNOSIS — C61 Malignant neoplasm of prostate: Secondary | ICD-10-CM | POA: Diagnosis not present

## 2012-04-26 DIAGNOSIS — C61 Malignant neoplasm of prostate: Secondary | ICD-10-CM | POA: Diagnosis not present

## 2012-04-26 DIAGNOSIS — C7951 Secondary malignant neoplasm of bone: Secondary | ICD-10-CM | POA: Diagnosis not present

## 2012-04-28 DIAGNOSIS — C61 Malignant neoplasm of prostate: Secondary | ICD-10-CM | POA: Diagnosis not present

## 2012-05-07 DIAGNOSIS — E7800 Pure hypercholesterolemia, unspecified: Secondary | ICD-10-CM | POA: Diagnosis not present

## 2012-05-07 DIAGNOSIS — Z0000 Encounter for general adult medical examination without abnormal findings: Secondary | ICD-10-CM | POA: Diagnosis not present

## 2012-05-07 DIAGNOSIS — L0211 Cutaneous abscess of neck: Secondary | ICD-10-CM | POA: Diagnosis not present

## 2012-05-07 DIAGNOSIS — E039 Hypothyroidism, unspecified: Secondary | ICD-10-CM | POA: Diagnosis not present

## 2012-05-07 DIAGNOSIS — C61 Malignant neoplasm of prostate: Secondary | ICD-10-CM | POA: Diagnosis not present

## 2012-05-07 DIAGNOSIS — E559 Vitamin D deficiency, unspecified: Secondary | ICD-10-CM | POA: Diagnosis not present

## 2012-05-09 DIAGNOSIS — M899 Disorder of bone, unspecified: Secondary | ICD-10-CM | POA: Diagnosis not present

## 2012-05-09 DIAGNOSIS — C801 Malignant (primary) neoplasm, unspecified: Secondary | ICD-10-CM | POA: Diagnosis not present

## 2012-05-09 DIAGNOSIS — C7982 Secondary malignant neoplasm of genital organs: Secondary | ICD-10-CM | POA: Diagnosis not present

## 2012-05-09 DIAGNOSIS — Z8546 Personal history of malignant neoplasm of prostate: Secondary | ICD-10-CM | POA: Diagnosis not present

## 2012-05-09 DIAGNOSIS — R6884 Jaw pain: Secondary | ICD-10-CM | POA: Diagnosis not present

## 2012-05-13 DIAGNOSIS — M899 Disorder of bone, unspecified: Secondary | ICD-10-CM | POA: Diagnosis not present

## 2012-05-13 DIAGNOSIS — Z8546 Personal history of malignant neoplasm of prostate: Secondary | ICD-10-CM | POA: Diagnosis not present

## 2012-05-13 DIAGNOSIS — J3489 Other specified disorders of nose and nasal sinuses: Secondary | ICD-10-CM | POA: Diagnosis not present

## 2012-06-08 DIAGNOSIS — I2510 Atherosclerotic heart disease of native coronary artery without angina pectoris: Secondary | ICD-10-CM | POA: Diagnosis not present

## 2012-06-08 DIAGNOSIS — E785 Hyperlipidemia, unspecified: Secondary | ICD-10-CM | POA: Diagnosis not present

## 2012-06-13 DIAGNOSIS — Z1389 Encounter for screening for other disorder: Secondary | ICD-10-CM | POA: Diagnosis not present

## 2012-06-14 DIAGNOSIS — I2510 Atherosclerotic heart disease of native coronary artery without angina pectoris: Secondary | ICD-10-CM | POA: Diagnosis not present

## 2012-06-14 DIAGNOSIS — E786 Lipoprotein deficiency: Secondary | ICD-10-CM | POA: Diagnosis not present

## 2012-11-01 DIAGNOSIS — C61 Malignant neoplasm of prostate: Secondary | ICD-10-CM | POA: Diagnosis not present

## 2012-11-08 DIAGNOSIS — C61 Malignant neoplasm of prostate: Secondary | ICD-10-CM | POA: Diagnosis not present

## 2012-11-08 DIAGNOSIS — C7951 Secondary malignant neoplasm of bone: Secondary | ICD-10-CM | POA: Diagnosis not present

## 2013-01-27 DIAGNOSIS — C61 Malignant neoplasm of prostate: Secondary | ICD-10-CM | POA: Diagnosis not present

## 2013-02-01 ENCOUNTER — Encounter (HOSPITAL_COMMUNITY): Payer: Self-pay | Admitting: Pharmacy Technician

## 2013-02-01 ENCOUNTER — Other Ambulatory Visit: Payer: Self-pay | Admitting: Urology

## 2013-02-01 DIAGNOSIS — C61 Malignant neoplasm of prostate: Secondary | ICD-10-CM

## 2013-02-02 ENCOUNTER — Other Ambulatory Visit: Payer: Self-pay | Admitting: Radiology

## 2013-02-06 ENCOUNTER — Ambulatory Visit (HOSPITAL_COMMUNITY)
Admission: RE | Admit: 2013-02-06 | Discharge: 2013-02-06 | Disposition: A | Discharge status: Home / Self Care | Payer: Medicare Other | Source: Ambulatory Visit | Attending: Urology | Admitting: Urology

## 2013-02-06 ENCOUNTER — Encounter (HOSPITAL_COMMUNITY): Payer: Self-pay

## 2013-02-06 ENCOUNTER — Other Ambulatory Visit: Payer: Self-pay | Admitting: Urology

## 2013-02-06 DIAGNOSIS — C61 Malignant neoplasm of prostate: Secondary | ICD-10-CM | POA: Diagnosis not present

## 2013-02-06 LAB — BASIC METABOLIC PANEL
BUN: 15 mg/dL (ref 6–23)
CO2: 28 mEq/L (ref 19–32)
Chloride: 98 mEq/L (ref 96–112)
GFR calc Af Amer: >90 mL/min (ref >90)
GFR calc non Af Amer: 85 mL/min — ABNORMAL LOW (ref >90)
Potassium: 3.9 mEq/L (ref 3.5–5.1)
Sodium: 136 mEq/L (ref 135–145)

## 2013-02-06 LAB — CBC
HCT: 38.1 % — ABNORMAL LOW (ref 39.0–52.0)
Hemoglobin: 13 g/dL (ref 13.0–17.0)
MCH: 30.4 pg (ref 26.0–34.0)
MCV: 89 fL (ref 78.0–100.0)
RBC: 4.28 MIL/uL (ref 4.22–5.81)

## 2013-02-06 LAB — PROTIME-INR
INR: 0.97 (ref 0.00–1.49)
Prothrombin Time: 12.7 seconds (ref 11.6–15.2)

## 2013-02-06 MED ORDER — VANCOMYCIN HCL IN DEXTROSE 1-5 GM/200ML-% IV SOLN
1000.0000 mg | Freq: Once | INTRAVENOUS | Status: AC
  Administered 2013-02-06: 1000 mg via INTRAVENOUS
  Filled 2013-02-06: qty 200

## 2013-02-06 MED ORDER — HEPARIN SODIUM (PORCINE) 1000 UNIT/ML IJ SOLN
4100.0000 [IU] | Freq: Once | INTRAMUSCULAR | Status: AC
  Administered 2013-02-06: 1000 [IU] via INTRAVENOUS

## 2013-02-06 MED ORDER — FENTANYL CITRATE 0.05 MG/ML IJ SOLN
INTRAMUSCULAR | Status: AC
  Filled 2013-02-06: qty 4

## 2013-02-06 MED ORDER — FENTANYL CITRATE 0.05 MG/ML IJ SOLN
INTRAMUSCULAR | Status: AC | PRN
  Administered 2013-02-06: 100 ug via INTRAVENOUS

## 2013-02-06 MED ORDER — HEPARIN (PORCINE) LOCK FLUSH 10 UNIT/ML IV SOLN: 4100.0000 [IU] | Freq: Once | INTRAVENOUS | Status: DC

## 2013-02-06 MED ORDER — HEPARIN SODIUM (PORCINE) 1000 UNIT/ML IJ SOLN
INTRAMUSCULAR | Status: AC
  Filled 2013-02-06: qty 1

## 2013-02-06 MED ORDER — LIDOCAINE HCL 1 % IJ SOLN
INTRAMUSCULAR | Status: AC
  Filled 2013-02-06: qty 20

## 2013-02-06 MED ORDER — SODIUM CHLORIDE 0.9 % IV SOLN
Freq: Once | INTRAVENOUS | Status: AC
  Administered 2013-02-06: 20 mL/h via INTRAVENOUS

## 2013-02-06 MED ORDER — MIDAZOLAM HCL 2 MG/2ML IJ SOLN
INTRAMUSCULAR | Status: AC
  Filled 2013-02-06: qty 4

## 2013-02-06 MED ORDER — MIDAZOLAM HCL 2 MG/2ML IJ SOLN
INTRAMUSCULAR | Status: AC | PRN
  Administered 2013-02-06: 2 mg via INTRAVENOUS

## 2013-02-06 NOTE — H&P (Signed)
Chief Complaint: "I''m here for a pheresis catheter" Referring Physician:Tannenbaum HPI: [name] [name] is an 72 y.o. male with hx of prostate cancer. He is to start plasma pharesis as part of his treatment and is scheduled today for tunneled pheresis catheter placement. PMHx and meds reviewed. Feels well, no recent fevers, illness    Past Medical History:  Past Medical History  Diagnosis Date  . Hypertension   . Hyperlipidemia   . Prostate carcinoma   . 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 BURNEY    Past Surgical History:  Past Surgical History  Procedure Laterality Date  . Resection bone tumor rib  [id number]    RT VAT , RESECTION OF 3rd,4th ,5th ribs with reconstruction with allograft and titanium platesDR. BURNEY    Family History: History reviewed. No pertinent family history.  Social History:  reports that he quit smoking about 38 years ago. He does not have any smokeless tobacco history on file. He reports that he does not drink alcohol or use illicit drugs.  Allergies:  Allergies  Allergen Reactions  . Penicillins Itching and Rash    Medications:   Medication List    ASK your doctor about these medications       amitriptyline 25 MG tablet  Commonly known as:  ELAVIL  Take 25 mg by mouth at bedtime.     aspirin EC 81 MG tablet  Take 81 mg by mouth at bedtime.     atenolol 25 MG tablet  Commonly known as:  TENORMIN  Take 25 mg by mouth at bedtime.     bicalutamide 50 MG tablet  Commonly known as:  CASODEX  Take 50 mg by mouth at bedtime.     CITRACAL + D PO  Take 3 tablets by mouth every morning.     ibuprofen 200 MG tablet  Commonly known as:  ADVIL,MOTRIN  Take 400 mg by mouth every 6 (six) hours as needed for pain.     simvastatin 80 MG tablet  Commonly known as:  ZOCOR  Take 80 mg by mouth at bedtime.        Please HPI for pertinent  positives, otherwise complete 10 system ROS negative.  Physical Exam: BP 107/71  Pulse 72  Temp(Src) 97.6 F (36.4 C) (Oral)  Resp 18  Ht 5\' 7"  (1.702 m)  Wt 165 lb (74.844 kg)  BMI 25.84 kg/m2  SpO2 97% Body mass index is 25.84 kg/(m^2).   General Appearance:  Alert, cooperative, no distress, appears stated age  Head:  Normocephalic, without obvious abnormality, atraumatic  ENT: Unremarkable  Neck: Supple, symmetrical, trachea midline  Lungs:   Clear to auscultation bilaterally, no w/r/r,   Chest Wall:  No tenderness or deformity  Heart:  Regular rate and rhythm, S1, S2 normal, no murmur, rub or gallop.  Neurologic: Normal affect, no gross deficits.   Results for orders placed during the hospital encounter of 02/06/13 (from the past 48 hour(s))  APTT     Status: None   Collection Time    02/06/13 11:57 AM      Result Value Range   aPTT 32  24 - 37 seconds  BASIC METABOLIC PANEL     Status: Abnormal   Collection Time    02/06/13 11:57 AM      Result Value Range   Sodium 136  135 - 145 mEq/L   Potassium 3.9  3.5 -  5.1 mEq/L   Chloride 98  96 - 112 mEq/L   CO2 28  19 - 32 mEq/L   Glucose, Bld 98  70 - 99 mg/dL   BUN 15  6 - 23 mg/dL   Creatinine, Ser 1.61  0.50 - 1.35 mg/dL   Calcium 09.6  8.4 - 04.5 mg/dL   GFR calc non Af Amer 85 (*) >90 mL/min   GFR calc Af Amer >90  >90 mL/min   Comment: (NOTE)     The eGFR has been calculated using the CKD EPI equation.     This calculation has not been validated in all clinical situations.     eGFR's persistently <90 mL/min signify possible Chronic Kidney     Disease.  CBC     Status: Abnormal   Collection Time    02/06/13 11:57 AM      Result Value Range   WBC 10.9 (*) 4.0 - 10.5 K/uL   RBC 4.28  4.22 - 5.81 MIL/uL   Hemoglobin 13.0  13.0 - 17.0 g/dL   HCT 40.9 (*) 81.1 - 91.4 %   MCV 89.0  78.0 - 100.0 fL   MCH 30.4  26.0 - 34.0 pg   MCHC 34.1  30.0 - 36.0 g/dL   RDW 78.2  95.6 - 21.3 %   Platelets 269  150 - 400 K/uL   PROTIME-INR     Status: None   Collection Time    02/06/13 11:57 AM      Result Value Range   Prothrombin Time 12.7  11.6 - 15.2 seconds   INR 0.97  0.00 - 1.49   No results found.  Assessment/Plan Prostate cancer Discussed placement of tunneled pheresis catheter Explained procedure, risks, complications, use of sedation. Labs reviewed, ok Consent signed in chart  Brayton El PA-C 02/06/2013, 1:20 PM

## 2013-02-06 NOTE — Progress Notes (Signed)
[name] [name] in Dr. Imelda Pillow office notified of pt. Having pheresis catheter in place, Dr. Imelda Pillow office to take care of getting catheter flushed, and dressing changed, OK per IR Radiologists for catheter to be in place for 8 weeks instead of 6 weeks.

## 2013-02-06 NOTE — Procedures (Signed)
Placement of right jugular pheresis catheter.  Tip in upper right atrium near cavoatrial junction.  No immediate complication.

## 2013-02-16 DIAGNOSIS — C61 Malignant neoplasm of prostate: Secondary | ICD-10-CM | POA: Diagnosis not present

## 2013-02-16 DIAGNOSIS — C7951 Secondary malignant neoplasm of bone: Secondary | ICD-10-CM | POA: Diagnosis not present

## 2013-02-21 DIAGNOSIS — C61 Malignant neoplasm of prostate: Secondary | ICD-10-CM | POA: Diagnosis not present

## 2013-02-21 DIAGNOSIS — C7951 Secondary malignant neoplasm of bone: Secondary | ICD-10-CM | POA: Diagnosis not present

## 2013-03-02 DIAGNOSIS — C61 Malignant neoplasm of prostate: Secondary | ICD-10-CM | POA: Diagnosis not present

## 2013-03-02 DIAGNOSIS — C7951 Secondary malignant neoplasm of bone: Secondary | ICD-10-CM | POA: Diagnosis not present

## 2013-03-30 ENCOUNTER — Other Ambulatory Visit: Payer: Self-pay | Admitting: Urology

## 2013-03-30 ENCOUNTER — Other Ambulatory Visit: Payer: Self-pay | Admitting: Radiology

## 2013-03-30 ENCOUNTER — Other Ambulatory Visit (HOSPITAL_COMMUNITY): Payer: Self-pay | Admitting: Urology

## 2013-03-30 DIAGNOSIS — C801 Malignant (primary) neoplasm, unspecified: Secondary | ICD-10-CM

## 2013-03-30 DIAGNOSIS — C7951 Secondary malignant neoplasm of bone: Secondary | ICD-10-CM | POA: Diagnosis not present

## 2013-03-30 DIAGNOSIS — C61 Malignant neoplasm of prostate: Secondary | ICD-10-CM | POA: Diagnosis not present

## 2013-03-31 ENCOUNTER — Ambulatory Visit (HOSPITAL_COMMUNITY)
Admission: RE | Admit: 2013-03-31 | Discharge: 2013-03-31 | Disposition: A | Discharge status: Home / Self Care | Payer: Medicare Other | Source: Ambulatory Visit | Attending: Urology | Admitting: Urology

## 2013-03-31 DIAGNOSIS — C61 Malignant neoplasm of prostate: Secondary | ICD-10-CM | POA: Diagnosis not present

## 2013-03-31 DIAGNOSIS — Z452 Encounter for adjustment and management of vascular access device: Secondary | ICD-10-CM | POA: Diagnosis not present

## 2013-03-31 DIAGNOSIS — C801 Malignant (primary) neoplasm, unspecified: Secondary | ICD-10-CM

## 2013-03-31 MED ORDER — LIDOCAINE HCL 1 % IJ SOLN
INTRAMUSCULAR | Status: AC
  Filled 2013-03-31: qty 20

## 2013-03-31 NOTE — Procedures (Addendum)
Successful removal of right IJ tunneled catheter, no longer needed. Catheter used for plasmapheresis patient with history of prostate cancer. No immediate complications.   Pattricia Boss PA-C Interventional Radiology  03/31/13  2:43 PM

## 2013-04-20 DIAGNOSIS — Z8719 Personal history of other diseases of the digestive system: Secondary | ICD-10-CM

## 2013-04-20 DIAGNOSIS — K2210 Ulcer of esophagus without bleeding: Secondary | ICD-10-CM

## 2013-04-20 HISTORY — DX: Ulcer of esophagus without bleeding: K22.10

## 2013-04-20 HISTORY — DX: Personal history of other diseases of the digestive system: Z87.19

## 2013-05-05 DIAGNOSIS — C7951 Secondary malignant neoplasm of bone: Secondary | ICD-10-CM | POA: Diagnosis not present

## 2013-05-12 DIAGNOSIS — C61 Malignant neoplasm of prostate: Secondary | ICD-10-CM | POA: Diagnosis not present

## 2013-05-12 DIAGNOSIS — C7951 Secondary malignant neoplasm of bone: Secondary | ICD-10-CM | POA: Diagnosis not present

## 2013-06-07 DIAGNOSIS — I2510 Atherosclerotic heart disease of native coronary artery without angina pectoris: Secondary | ICD-10-CM | POA: Diagnosis not present

## 2013-06-07 DIAGNOSIS — E785 Hyperlipidemia, unspecified: Secondary | ICD-10-CM | POA: Diagnosis not present

## 2013-07-04 ENCOUNTER — Other Ambulatory Visit (HOSPITAL_COMMUNITY): Payer: Self-pay | Admitting: Urology

## 2013-07-04 ENCOUNTER — Ambulatory Visit (HOSPITAL_COMMUNITY)
Admission: RE | Admit: 2013-07-04 | Discharge: 2013-07-04 | Disposition: A | Discharge status: Home / Self Care | Payer: Medicare Other | Source: Ambulatory Visit | Attending: Urology | Admitting: Urology

## 2013-07-04 DIAGNOSIS — C801 Malignant (primary) neoplasm, unspecified: Secondary | ICD-10-CM | POA: Diagnosis not present

## 2013-07-04 DIAGNOSIS — M899 Disorder of bone, unspecified: Secondary | ICD-10-CM | POA: Insufficient documentation

## 2013-07-04 DIAGNOSIS — R52 Pain, unspecified: Secondary | ICD-10-CM

## 2013-07-04 DIAGNOSIS — C61 Malignant neoplasm of prostate: Secondary | ICD-10-CM | POA: Diagnosis not present

## 2013-07-04 DIAGNOSIS — M25559 Pain in unspecified hip: Secondary | ICD-10-CM | POA: Diagnosis not present

## 2013-07-04 DIAGNOSIS — M949 Disorder of cartilage, unspecified: Secondary | ICD-10-CM

## 2013-07-04 DIAGNOSIS — C7982 Secondary malignant neoplasm of genital organs: Secondary | ICD-10-CM | POA: Diagnosis not present

## 2013-07-04 DIAGNOSIS — C7952 Secondary malignant neoplasm of bone marrow: Secondary | ICD-10-CM | POA: Diagnosis not present

## 2013-07-04 DIAGNOSIS — C7951 Secondary malignant neoplasm of bone: Secondary | ICD-10-CM | POA: Diagnosis not present

## 2013-08-11 DIAGNOSIS — C61 Malignant neoplasm of prostate: Secondary | ICD-10-CM | POA: Diagnosis not present

## 2013-08-15 DIAGNOSIS — C61 Malignant neoplasm of prostate: Secondary | ICD-10-CM | POA: Diagnosis not present

## 2013-08-18 ENCOUNTER — Other Ambulatory Visit (HOSPITAL_COMMUNITY): Payer: Self-pay | Admitting: Urology

## 2013-08-18 DIAGNOSIS — C61 Malignant neoplasm of prostate: Secondary | ICD-10-CM

## 2013-08-30 ENCOUNTER — Ambulatory Visit (HOSPITAL_COMMUNITY)
Admission: RE | Admit: 2013-08-30 | Discharge: 2013-08-30 | Disposition: A | Discharge status: Home / Self Care | Payer: Medicare Other | Source: Ambulatory Visit | Attending: Urology | Admitting: Urology

## 2013-08-30 ENCOUNTER — Encounter (HOSPITAL_COMMUNITY): Payer: Self-pay

## 2013-08-30 DIAGNOSIS — C7952 Secondary malignant neoplasm of bone marrow: Principal | ICD-10-CM

## 2013-08-30 DIAGNOSIS — R918 Other nonspecific abnormal finding of lung field: Secondary | ICD-10-CM | POA: Insufficient documentation

## 2013-08-30 DIAGNOSIS — R972 Elevated prostate specific antigen [PSA]: Secondary | ICD-10-CM | POA: Insufficient documentation

## 2013-08-30 DIAGNOSIS — C61 Malignant neoplasm of prostate: Secondary | ICD-10-CM

## 2013-08-30 DIAGNOSIS — C7951 Secondary malignant neoplasm of bone: Secondary | ICD-10-CM | POA: Diagnosis not present

## 2013-08-30 DIAGNOSIS — M948X9 Other specified disorders of cartilage, unspecified sites: Secondary | ICD-10-CM | POA: Diagnosis not present

## 2013-08-30 DIAGNOSIS — M959 Acquired deformity of musculoskeletal system, unspecified: Secondary | ICD-10-CM | POA: Insufficient documentation

## 2013-08-30 MED ORDER — FLUDEOXYGLUCOSE F - 18 (FDG) INJECTION
11.6000 | Freq: Once | INTRAVENOUS | Status: AC | PRN
  Administered 2013-08-30: 11.6 via INTRAVENOUS

## 2013-09-05 DIAGNOSIS — M25559 Pain in unspecified hip: Secondary | ICD-10-CM | POA: Diagnosis not present

## 2013-09-05 DIAGNOSIS — C7951 Secondary malignant neoplasm of bone: Secondary | ICD-10-CM | POA: Diagnosis not present

## 2013-09-05 DIAGNOSIS — C61 Malignant neoplasm of prostate: Secondary | ICD-10-CM | POA: Diagnosis not present

## 2013-09-08 ENCOUNTER — Telehealth: Payer: Self-pay | Admitting: Oncology

## 2013-09-08 NOTE — Telephone Encounter (Signed)
S/W PATIENT WIFE AND GAVE NP APPT FOR 06/02 @ 1:30 W/DR. SHADAD. REFERRING DR. SIGMUND TANNENBAUM DX- BONE METS

## 2013-09-12 ENCOUNTER — Telehealth: Payer: Self-pay | Admitting: Oncology

## 2013-09-12 ENCOUNTER — Ambulatory Visit: Payer: Medicare Other | Admitting: Radiation Oncology

## 2013-09-12 ENCOUNTER — Ambulatory Visit: Payer: Medicare Other

## 2013-09-12 ENCOUNTER — Encounter: Payer: Self-pay | Admitting: Radiation Oncology

## 2013-09-12 NOTE — Telephone Encounter (Signed)
C/D 09/12/13 for appt. 09/19/13

## 2013-09-12 NOTE — Progress Notes (Signed)
Histology and Location of Primary Cancer: prostate  Sites of Visceral and Bony Metastatic Disease: lung, ribs, right scapula, sacrum, right ischial tuberosity, proximal right femur, right calvarium  Location(s) of Symptomatic Metastases: L-S spine, left hip  Past/Anticipated chemotherapy by medical oncology, if any: new consult appointment w/Dr Alen Blew on 09/19/13  Pain on a scale of 0-10 is:  4-5, across lower back, Tramadol prn   If Spine Met(s), symptoms, if any, include:  Bowel/Bladder retention or incontinence (please describe): constipation  Numbness or weakness in extremities (please describe): no  Current Decadron regimen, if applicable: no  Ambulatory status? Walker? Wheelchair?: ambulatory  SAFETY ISSUES:  Prior radiation? no  Pacemaker/ICD? no  Possible current pregnancy? na  Is the patient on methotrexate? no  Current Complaints / other details: married, retired from [employer], [place] and [place], loss of appetite. Xtandi d/ced 2 days ago per Dr Gaynelle Arabian, wife felt it caused constipation, anorexia.  To discuss radiation therapy to painful bone mets.

## 2013-09-13 ENCOUNTER — Ambulatory Visit
Admission: RE | Admit: 2013-09-13 | Discharge: 2013-09-13 | Disposition: A | Discharge status: Home / Self Care | Payer: Medicare Other | Source: Ambulatory Visit | Attending: Radiation Oncology | Admitting: Radiation Oncology

## 2013-09-13 ENCOUNTER — Encounter: Payer: Self-pay | Admitting: Radiation Oncology

## 2013-09-13 ENCOUNTER — Other Ambulatory Visit: Payer: Self-pay | Admitting: Oncology

## 2013-09-13 VITALS — BP 102/70 | HR 73 | Temp 98.1°F | Resp 20 | Ht 67.0 in | Wt 143.0 lb

## 2013-09-13 DIAGNOSIS — Z87891 Personal history of nicotine dependence: Secondary | ICD-10-CM | POA: Insufficient documentation

## 2013-09-13 DIAGNOSIS — C61 Malignant neoplasm of prostate: Secondary | ICD-10-CM | POA: Diagnosis not present

## 2013-09-13 DIAGNOSIS — C7951 Secondary malignant neoplasm of bone: Secondary | ICD-10-CM

## 2013-09-13 DIAGNOSIS — C7952 Secondary malignant neoplasm of bone marrow: Principal | ICD-10-CM

## 2013-09-13 DIAGNOSIS — Z7982 Long term (current) use of aspirin: Secondary | ICD-10-CM | POA: Insufficient documentation

## 2013-09-13 DIAGNOSIS — Z510 Encounter for antineoplastic radiation therapy: Secondary | ICD-10-CM | POA: Diagnosis not present

## 2013-09-13 HISTORY — DX: Other fatigue: R53.83

## 2013-09-13 HISTORY — DX: Anxiety disorder, unspecified: F41.9

## 2013-09-13 HISTORY — DX: Tachycardia, unspecified: R00.0

## 2013-09-13 NOTE — Progress Notes (Signed)
[facility] NEW PATIENT EVALUATION  Name: [name] [name] MRN: [id number]  Date:   09/13/2013           DOB: [birth year]  Status: outpatient   CC:   Ailene Rud, * Dr. Zola Button   REFERRING PHYSICIAN: Carolan Clines I, *   DIAGNOSIS: Metastatic castrate resistant carcinoma the prostate   HISTORY OF PRESENT ILLNESS:  [name] [name] is a 73 y.o. male who is seen today through the courtesy of Dr. Gaynelle Arabian for discussion of possible palliative radiotherapy in the management of his metastatic castrate resistant carcinoma the prostate. He was initially diagnosed with metastatic disease with biopsy of a right rib metastasis by Dr. Arlyce Dice in July of 2012. His PSA was found to be 187. He went on to receive androgen deprivation therapy and then Provenge and Xstandi. Extended was started in February of 2015 and discontinued earlier this week because of his recent rise in PSA. Dr. Era Bumpers was concerned that this was causing nausea and vomiting along with a 10 pound weight loss over the past month. His nausea has diminished significantly over the past few days after stopping Xstandi. He is also been on Casodex since 2013. His PSA fell to 16.74 by July 2014, rising 178.6 by January 2015, and 192 from 08/11/2013. He recently had a sodium chloride F. 18 PET scan showing widespread metastatic disease with sclerotic and lytic changes. He was felt to have pulmonary metastases with the largest lesion measuring 0.9 cm along the left lingula. Significant uptake was seen along the right humerus, left sacral wing, left iliac bone, L2 vertebra and also right ischium. Right inferior pubic rami fractures were seen. There was no significant adenopathy appreciated, however was apparently some adenopathy seen at the time of an [facility] CT scan. His major complaint today is that of mid to lower lumbar spine discomfort and right groin discomfort. He has been taking  tramadol and ibuprofen when necessary pain. As mentioned above, his nausea is improved after stopping Xstandi. He'll see Dr. Alen Blew in consultation this coming Tuesday.  PREVIOUS RADIATION THERAPY: No   PAST MEDICAL HISTORY:  has a past medical history of Hypertension; Hyperlipidemia; Prostate carcinoma; Metastatic bone tumor (11/13/2010); Anxiety; GERD (gastroesophageal reflux disease); Tachycardia; and Fatigue.     PAST SURGICAL HISTORY:  Past Surgical History  Procedure Laterality Date  . Resection bone tumor rib  [id number]    RT VAT , RESECTION OF 3rd,4th ,5th ribs with reconstruction with allograft and titanium platesDR. BURNEY  . Dental surgery       FAMILY HISTORY: family history includes Aneurysm in his father. His father died from an abdominal aortic aneurysm at age 57. His mother died from dementia at 19. No family history of prostate cancer.   SOCIAL HISTORY:  reports that he quit smoking about 39 years ago. His smoking use included Cigarettes. He has a 30 pack-year smoking history. He does not have any smokeless tobacco history on file. He reports that he does not drink alcohol or use illicit drugs. Married, 2 sons. 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   ALLERGIES: Oxycodone and Penicillins   MEDICATIONS:  Current Outpatient Prescriptions  Medication Sig Dispense Refill  . amitriptyline (ELAVIL) 25 MG tablet Take 25 mg by mouth at bedtime.      Marland Kitchen aspirin EC 81 MG tablet Take 81 mg by mouth at bedtime.      Marland Kitchen  atenolol (TENORMIN) 25 MG tablet Take 25 mg by mouth at bedtime.       . bicalutamide (CASODEX) 50 MG tablet Take 50 mg by mouth at bedtime.       . Calcium Citrate-Vitamin D (CITRACAL + D PO) Take 3 tablets by mouth every morning.      Marland Kitchen ibuprofen (ADVIL,MOTRIN) 200 MG tablet Take 400 mg by mouth every 6 (six) hours as needed for pain.      . simvastatin (ZOCOR) 80 MG tablet Take 80 mg  by mouth at bedtime.        . traMADol (ULTRAM) 50 MG tablet Take by mouth every 6 (six) hours as needed.       No current facility-administered medications for this encounter.     REVIEW OF SYSTEMS:  Pertinent items are noted in HPI.    PHYSICAL EXAM:  height is 5\' 7"  (1.702 m) and weight is 143 lb (64.864 kg). His oral temperature is 98.1 F (36.7 C). His blood pressure is 102/70 and his pulse is 73. His respiration is 20.   Alert and oriented 73 year old white male appearing his stated age. Head and neck examination: Grossly unremarkable. Nodes: Without palpable cervical or supraclavicular lymphadenopathy. Chest: Lungs clear. Abdomen: Without masses organomegaly. Back: There is pain described along the mid to lower lumbar spine. There is no palpable spinal discomfort. Pelvis: There is palpable discomfort along his right ischium and right pubic rami. Extremities: There is no palpable right humeral pain.   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 I discussed with the patient and his wife the natural history of prostate cancer. Systemic therapies so far received include antigen deprivation therapy in addition to prevent and expanded. I do not believe that he has had Zytiga or Taxotere. Radium-223 is another option, but this would not address his suspected pulmonary metastases and possible adenopathy. We discussed the process of radium 223 isotope  therapy along with potential toxicities. At this point in time he does not have widespread symptomatic disease, and therefore I think it would be reasonable to give him external beam therapy to his to lumbar spine (L2 vertebra) and also right ischium/pubic rami and have Dr. Alen Blew consider Zytiga or Taxotere chemotherapy. We discussed the potential acute and late toxicities of external beam radiation therapy as well.   PLAN: The patient will meet with Dr. Alen Blew next Tuesday, and I ask that Dr. Alen Blew contact me to discuss his management.  I spent 60 minutes minutes face to face with the patient and more than 50% of that time was spent in counseling and/or coordination of care.

## 2013-09-13 NOTE — Progress Notes (Signed)
Please see the Nurse Progress Note in the MD Initial Consult Encounter for this patient. 

## 2013-09-14 ENCOUNTER — Encounter: Payer: Self-pay | Admitting: *Deleted

## 2013-09-14 NOTE — Progress Notes (Signed)
[place] Psychosocial Distress Screening Clinical Social Work  Clinical Social Work was referred by distress screening protocol.  The patient scored a 6 on the Psychosocial Distress Thermometer which indicates moderate distress. Clinical Social Worker contacted patient/family to assess for distress and other psychosocial needs. CSW unable to speak with patient, patient spoke with patient's spouse.  Patient's spouse shared she feels they are both "doing status quo", patient's spouse indicated they are somewhat anxious about knowing full treatment plan at this time, but felt very positive after meeting with Dr. Valere Dross.  She stated Dr. Valere Dross was very thorough and made the family feel confident about the process.  Patient's spouse stated they addressed physical problems with MD.  CSW encouraged patient/family to request dietitian appointment if loss of appetite continues.  CSW encouraged patient to contact CSW with any questions or concerns.  ONCBCN DISTRESS SCREENING 09/13/2013  Screening Type Initial Screening  Elta Gu[place] the number that describes how much distress you have been experiencing in the past week 6  Information Concerns Type Lack of info about treatment  Physical Problem type Pain;Loss of appetitie;Constipation/diarrhea    Clinical Social Worker follow up needed: no  If yes, follow up plan:   Polo Riley, MSW, LCSW, OSW-C Clinical Social Worker [facility] [phone]

## 2013-09-18 ENCOUNTER — Telehealth: Payer: Self-pay | Admitting: Medical Oncology

## 2013-09-18 NOTE — Telephone Encounter (Signed)
Spoke with wife to confirm tomorrow's appt with Dr. Alen Blew and requested pt to bring a current list of his medications. Wife gave verbal confirmation, denies further questions at this time.

## 2013-09-19 ENCOUNTER — Ambulatory Visit (HOSPITAL_BASED_OUTPATIENT_CLINIC_OR_DEPARTMENT_OTHER): Payer: Medicare Other | Admitting: Oncology

## 2013-09-19 ENCOUNTER — Encounter: Payer: Self-pay | Admitting: Oncology

## 2013-09-19 ENCOUNTER — Telehealth: Payer: Self-pay | Admitting: Oncology

## 2013-09-19 ENCOUNTER — Ambulatory Visit: Payer: Medicare Other

## 2013-09-19 ENCOUNTER — Other Ambulatory Visit (HOSPITAL_BASED_OUTPATIENT_CLINIC_OR_DEPARTMENT_OTHER): Payer: Medicare Other

## 2013-09-19 VITALS — BP 103/63 | HR 85 | Temp 97.8°F | Resp 18 | Ht 67.0 in | Wt 141.5 lb

## 2013-09-19 DIAGNOSIS — G893 Neoplasm related pain (acute) (chronic): Secondary | ICD-10-CM | POA: Diagnosis not present

## 2013-09-19 DIAGNOSIS — C7951 Secondary malignant neoplasm of bone: Secondary | ICD-10-CM

## 2013-09-19 DIAGNOSIS — C7952 Secondary malignant neoplasm of bone marrow: Secondary | ICD-10-CM

## 2013-09-19 DIAGNOSIS — R112 Nausea with vomiting, unspecified: Secondary | ICD-10-CM

## 2013-09-19 DIAGNOSIS — C61 Malignant neoplasm of prostate: Secondary | ICD-10-CM | POA: Diagnosis not present

## 2013-09-19 LAB — COMPREHENSIVE METABOLIC PANEL (CC13)
ALBUMIN: 3 g/dL — AB (ref 3.5–5.0)
ALT: 8 U/L (ref 0–55)
ANION GAP: 12 meq/L — AB (ref 3–11)
AST: 21 U/L (ref 5–34)
Alkaline Phosphatase: 160 U/L — ABNORMAL HIGH (ref 40–150)
BUN: 16.8 mg/dL (ref 7.0–26.0)
CO2: 25 meq/L (ref 22–29)
Calcium: 9.8 mg/dL (ref 8.4–10.4)
Chloride: 99 mEq/L (ref 98–109)
Creatinine: 0.8 mg/dL (ref 0.7–1.3)
GLUCOSE: 93 mg/dL (ref 70–140)
POTASSIUM: 4.6 meq/L (ref 3.5–5.1)
SODIUM: 136 meq/L (ref 136–145)
TOTAL PROTEIN: 7.3 g/dL (ref 6.4–8.3)
Total Bilirubin: 0.71 mg/dL (ref 0.20–1.20)

## 2013-09-19 LAB — CBC WITH DIFFERENTIAL/PLATELET
BASO%: 0.8 % (ref 0.0–2.0)
Basophils Absolute: 0.1 10*3/uL (ref 0.0–0.1)
EOS%: 3 % (ref 0.0–7.0)
Eosinophils Absolute: 0.3 10*3/uL (ref 0.0–0.5)
HCT: 36.1 % — ABNORMAL LOW (ref 38.4–49.9)
HGB: 11.7 g/dL — ABNORMAL LOW (ref 13.0–17.1)
LYMPH%: 14.5 % (ref 14.0–49.0)
MCH: 28.5 pg (ref 27.2–33.4)
MCHC: 32.4 g/dL (ref 32.0–36.0)
MCV: 88.2 fL (ref 79.3–98.0)
MONO#: 0.7 10*3/uL (ref 0.1–0.9)
MONO%: 9 % (ref 0.0–14.0)
NEUT%: 72.7 % (ref 39.0–75.0)
NEUTROS ABS: 6 10*3/uL (ref 1.5–6.5)
Platelets: 321 10*3/uL (ref 140–400)
RBC: 4.09 10*6/uL — AB (ref 4.20–5.82)
RDW: 14.6 % (ref 11.0–14.6)
WBC: 8.2 10*3/uL (ref 4.0–10.3)
lymph#: 1.2 10*3/uL (ref 0.9–3.3)

## 2013-09-19 NOTE — Telephone Encounter (Signed)
gv relative appt schedule for july

## 2013-09-19 NOTE — Progress Notes (Signed)
Checked in new pt with no financial concerns. °

## 2013-09-19 NOTE — Progress Notes (Signed)
Please see consult note.  

## 2013-09-19 NOTE — Consult Note (Signed)
Reason for Referral: Prostate cancer.  HPI: This is a pleasant 73 year old gentleman native of the [place],  [place] but currently lives in [place]. He retired from [employer] for a while now. He does not really have any significant past medical history except for occasional palpitation and tachycardia. He was found to have a mass in the right axilla near the fourth rib encasing the right with some sclerosis. He underwent a right VATS procedure and resection of the third, fourth and fifth rib and reconstruction with titanium plates under the care of Dr. Arlyce Dice on November 13 2010. The pathology confirmed the presence of prostate cancer and his PSA was 187.4. He went on to receive androgen deprivation therapy under the care of Dr. Gaynelle Arabian. His PSA nadir was to 0.1 back in May of 2013 and subsequently started to rise. He was treated with Provenge in the fall of 2014. After his PSA went up to 178.6 and Xtandi was started in February of 2015 and discontinued a week ago because of his recent rise in PSA. Dr. Era Bumpers was concerned that this was causing nausea and vomiting along with a 10 pound weight loss over the past month. His nausea has diminished significantly over the past week after stopping Xtandi. He is also been on Casodex since 2013. He recently had a sodium chloride F. 18 PET scan on 08/03/2013 showing widespread metastatic disease with sclerotic and lytic changes. He was felt to have pulmonary metastases with the largest lesion measuring 0.9 cm along the left lingula. Significant uptake was seen along the right humerus, left sacral wing, left iliac bone, L2 vertebra and also right ischium. Right inferior pubic rami fractures were seen. There was no significant adenopathy appreciated, however was apparently some adenopathy seen at the time of an [facility] CT scan.   Clinically, he does report consistent lower back pain but is not really interfering with his function. He is able to sleep without  any major problems. He has not reported any neurological symptoms of lower extremity weakness or neuropathy. His pain is graded 3-4/10 but he is a poor historian and seems to underestimate his pain or his wife. He does take pain medication which have helped his symptoms. He does not report any other pain in his bone at this time. He did not report any headaches or blurry vision or double vision. Did not report any chest pain or difficulty breathing. He did report about 25 pound weight loss in the last 3 months. His stubble to drive and has reasonable performance status at this time.    Past Medical History  Diagnosis Date  . Hypertension   . Hyperlipidemia   . Prostate carcinoma   . 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 BURNEY  . Anxiety   . GERD (gastroesophageal reflux disease)   . Tachycardia     history of  . Fatigue   :  Past Surgical History  Procedure Laterality Date  . Resection bone tumor rib  [id number]    RT VAT , RESECTION OF 3rd,4th ,5th ribs with reconstruction with allograft and titanium platesDR. BURNEY  . Dental surgery    :  Current Outpatient Prescriptions  Medication Sig Dispense Refill  . amitriptyline (ELAVIL) 25 MG tablet Take 25 mg by mouth at bedtime.      Marland Kitchen aspirin EC 81 MG tablet Take 81 mg by mouth at bedtime.      Marland Kitchen  atenolol (TENORMIN) 25 MG tablet Take 25 mg by mouth at bedtime.       . bicalutamide (CASODEX) 50 MG tablet Take 50 mg by mouth at bedtime.       . Calcium Citrate-Vitamin D (CITRACAL + D PO) Take 3 tablets by mouth every morning.      Marland Kitchen ibuprofen (ADVIL,MOTRIN) 200 MG tablet Take 400 mg by mouth every 6 (six) hours as needed for pain.      . simvastatin (ZOCOR) 80 MG tablet Take 80 mg by mouth at bedtime.        . traMADol (ULTRAM) 50 MG tablet Take by mouth every 6 (six) hours as needed.       No current facility-administered medications for this  visit.       Allergies  Allergen Reactions  . Oxycodone Nausea And Vomiting  . Penicillins Itching and Rash  :  Family History  Problem Relation Age of Onset  . Aneurysm Father   :  History   Social History  . Marital Status: Married    Spouse Name: N/A    Number of Children: N/A  . Years of Education: N/A   Occupational History  . Not on file.   Social History Main Topics  . Smoking status: Former Smoker -- 2.00 packs/day for 15 years    Types: Cigarettes    Quit date: 04/20/1974  . Smokeless tobacco: Not on file  . Alcohol Use: No  . Drug Use: No  . Sexual Activity: Not on file   Other Topics Concern  . Not on file   Social History Narrative  . No narrative on file  :  Constitutional: positive for weight loss, negative for chills, fevers and night sweats Ears, nose, mouth, throat, and face: negative for epistaxis, tinnitus and voice change Respiratory: negative for cough, hemoptysis and wheezing Cardiovascular: negative for irregular heart beat, orthopnea and paroxysmal nocturnal dyspnea Gastrointestinal: negative for abdominal pain, jaundice and vomiting Genitourinary:negative for dysuria, frequency and hematuria Integument/breast: negative for pruritus and rash Hematologic/lymphatic: negative for bleeding, easy bruising and lymphadenopathy Musculoskeletal:negative for arthralgias and stiff joints Neurological: negative for coordination problems, dizziness and gait problems Behavioral/Psych: negative for anxiety and depression Endocrine: negative for temperature intolerance Allergic/Immunologic: negative for anaphylaxis and urticaria  Exam: ECOG 1 Blood pressure 103/63, pulse 85, temperature 97.8 F (36.6 C), temperature source Oral, resp. rate 18, height 5\' 7"  (1.702 m), weight 141 lb 8 oz (64.184 kg), SpO2 92.00%. General appearance: alert, cooperative and appears stated age Nose: Nares normal. Septum midline. Mucosa normal. No drainage or sinus  tenderness. Throat: lips, mucosa, and tongue normal; teeth and gums normal Neck: no adenopathy Back: symmetric, no curvature. ROM normal. No CVA tenderness. Resp: clear to auscultation bilaterally Chest wall: no tenderness Cardio: regular rate and rhythm, S1, S2 normal, no murmur, click, rub or gallop GI: soft, non-tender; bowel sounds normal; no masses,  no organomegaly Extremities: extremities normal, atraumatic, no cyanosis or edema Pulses: 2+ and symmetric Skin: Skin color, texture, turgor normal. No rashes or lesions Lymph nodes: Cervical, supraclavicular, and axillary nodes normal. Neurologic: Grossly normal   Recent Labs  09/19/13 1339  WBC 8.2  HGB 11.7*  HCT 36.1*  PLT 321    Recent Labs  09/19/13 1339  NA 136  K 4.6  CO2 25  GLUCOSE 93  BUN 16.8  CREATININE 0.8  CALCIUM 9.8      Nm Pet Mets (nopr) Whole Body (naf)  09/01/2013   CLINICAL DATA:  Evaluate for bone metastasis.  Elevated PSA.  EXAM: NUCLEAR MEDICINE BONE METS (NOPR) WHOLE BODY (NaF)  TECHNIQUE: 11.6 mCi F-18 NaF injected intravenously. Full-ring PET imaging was performed from the skull vertex to toes after the radiotracer. CT data was obtained and used for attenuation correction and anatomic localization.  COMPARISON:  None.  FINDINGS: SKELETON  Multifocal areas of abnormal increased radiotracer uptake are identified within the axial and appendicular skeleton compatible with metastatic disease. A small focus of moderate increased uptake is identified within the left frontal bone. There is a large focus of intense radiotracer uptake localizing to the proximal right humerus. On the corresponding CT images there is abnormal increased sclerosus within this area. There is a compression deformity involving the T12 vertebra. Moderate increased radiotracer uptake localizes to the T12 vertebra which is somewhat nonspecific in the setting of a fracture. Similarly, there is a superior endplate deformity involving the  L4 vertebra with associated increased radiotracer uptake. A large focus of intense radiotracer uptake localizes to the L2 vertebra. This is worrisome for metastatic disease. Intense radiotracer uptake is identified localizing to the left sacral wing and left iliac bone. On the corresponding CT images there is increased bone lucency consistent with a lytic bone metastasis. There is also abnormal asymmetric increased radiotracer uptake localizing to much of the right ischial bone. There is of fracture involving the right inferior pubic rami.  NECK  No enlarged lymph nodes or mass identified.  CHEST  There is calcified atherosclerotic disease involving the aortic arch as well as the LAD, RCA and left circumflex coronary arteries. Multiple small pulmonary nodules are identified in both lungs. Index nodule within the right lower lobe measures 6 mm, image 119/series 3. Nodule within the right upper lobe measures 7 mm, image 95/series 3. Nodule within the lingular portion of the left lung measures 9 mm. No enlarged mediastinal or hilar lymph nodes. No axillary or supraclavicular adenopathy noted.  ABDOMEN/PELVIS  Unremarkable appearance of the liver, gallbladder, pancreas and spleen. The adrenal glands both appear normal. The kidneys are unremarkable. No upper abdominal adenopathy identified. There is no pelvic or inguinal adenopathy.  IMPRESSION: 1. Examination is positive for multi focal abnormal areas of increased uptake compatible with bone metastasis. On the corresponding CT images there are both sclerotic and lytic bone changes. 2. Multi focal pulmonary nodules are concerning for pulmonary metastasis. 3. Spinal compression deformities and right inferior pubic rami fractures.   Electronically Signed   By: Kerby Moors M.D.   On: 09/01/2013 08:49    Assessment and Plan:   73 year old gentleman with the following issues:  1. Castration resistant metastatic prostate cancer with disease to the bone. He also  appears to have visceral metastasis as well. His initial diagnosis was in 2012 where he a presented with metastasis to the rib and underwent surgical resection. His PSA at that time was 187.4. He received androgen depravation in an excellent predictable response but subsequently developed a rise in his PSA despite castrate level testosterone. He received Provenge immunotherapy and in February of 2015 he also received Xtandi with poor tolerance and poor PSA response. The natural course of castration resistant prostate cancer was discussed with the patient and his wife. Treatment options were discussed today including second line hormonal manipulation in the form of Zytiga, ketoconazole prednisone and systemic chemotherapy in the form of Taxotere. Risks and benefits of all these treatments were discussed and I feel given his poor response to [facility] and the wide spread of  his disease especially with a recent PSA above 192.3 I feel that he needs a more aggressive treatment. The logistics of administration of Taxotere chemotherapy was discussed. Complications from this medication include nausea, vomiting, fatigue, myelosuppression, neutropenia, neutropenic sepsis, possible need for hospitalization and intravenous antibiotics. Rarely this will cause severe illness and death. Other complications that includes peripheral neuropathy, hair loss, pleural effusion or all discussed. The benefit of this medication includes decrease in his PSA and cancer load, palliation of his overall pain as well extending over all survival. I also discussed with him the possible need for a Port-A-Cath insertion and the need for chemotherapy education class. He will consider this information he'll let me know in the near future. Once he is agreeable, we will arrange for him to start in the near future.  2. Bone pain: I have discussed this case with Dr. Valere Dross and for the time being, we well hold off on palliative radiation therapy unless he  develops increased and his pain that is refractory to narcotic analgesics and certainly if he develops any skeletal related events.  3. Bone directed therapy: We will address that in future visits he'll be a good candidate for Xgeva I will discuss that with him in future visits.  4. Nausea and vomiting: Likely related to Xtandi medication and seems to be improving after stopping this current medication.

## 2013-09-20 ENCOUNTER — Telehealth: Payer: Self-pay | Admitting: Medical Oncology

## 2013-09-20 ENCOUNTER — Other Ambulatory Visit: Payer: Self-pay | Admitting: Medical Oncology

## 2013-09-20 LAB — TESTOSTERONE: Testosterone: 22 ng/dL — ABNORMAL LOW (ref 300–890)

## 2013-09-20 LAB — PSA: PSA: 292.9 ng/mL — AB (ref <=4.00)

## 2013-09-20 MED ORDER — PROCHLORPERAZINE MALEATE 10 MG PO TABS: 10.0000 mg | ORAL_TABLET | Freq: Four times a day (QID) | ORAL | Status: DC | PRN

## 2013-09-20 NOTE — Addendum Note (Signed)
Addended by: Wyatt Portela on: 09/20/2013 09:47 AM   Modules accepted: Orders

## 2013-09-20 NOTE — Telephone Encounter (Signed)
Got it!     Thanks =).

## 2013-09-20 NOTE — Telephone Encounter (Signed)
Patient's wife LVMOM regarding patient's decision to start chemotherapy treatment. Wife states patient "wishes to ago ahead with chemo." MD inboxed.  LOV 06/02 New patient 07/09 Lab/MD

## 2013-09-20 NOTE — Telephone Encounter (Signed)
I sent a POF to schedule chemotherapy and chemo class.  Please call him compazine 10 if he needs antiemetics. He might have some already.  Thanks.

## 2013-09-20 NOTE — Telephone Encounter (Signed)
Informed pt/pt spouse, per MD, to expect call from scheduling regarding appts for treatment as well as the Chemo Education class. Also informed them that MD is prescribing antiemetics, compazine 10mg  tablet to take 1 tablet by mouth every 6 hours as needed for nausea/vomitting. Verbal understanding given. Prescription sent to patient's listed pharmacy as requested.  Denies further questions at this time. Patient knows to call office with any questions or concerns.

## 2013-10-05 ENCOUNTER — Telehealth: Payer: Self-pay | Admitting: Oncology

## 2013-10-05 ENCOUNTER — Telehealth: Payer: Self-pay | Admitting: *Deleted

## 2013-10-05 ENCOUNTER — Telehealth: Payer: Self-pay | Admitting: Medical Oncology

## 2013-10-05 NOTE — Telephone Encounter (Signed)
, °

## 2013-10-05 NOTE — Telephone Encounter (Signed)
Per staff message and POF I have scheduled appts. Advised scheduler of appts. JMW  

## 2013-10-05 NOTE — Telephone Encounter (Signed)
Patient's spouse called inquiry about future appts, states hasn't heard anything from clinic. Call back to patient informing her scheduling will contact her today to set those appts up for patient. Spouse expressed thanks, knows to call office with any further questions or concerns.

## 2013-10-06 ENCOUNTER — Ambulatory Visit (HOSPITAL_BASED_OUTPATIENT_CLINIC_OR_DEPARTMENT_OTHER): Payer: Medicare Other

## 2013-10-06 ENCOUNTER — Other Ambulatory Visit: Payer: Medicare Other

## 2013-10-06 ENCOUNTER — Other Ambulatory Visit (HOSPITAL_BASED_OUTPATIENT_CLINIC_OR_DEPARTMENT_OTHER): Payer: Medicare Other

## 2013-10-06 VITALS — BP 104/54 | HR 62 | Temp 97.9°F | Resp 18

## 2013-10-06 DIAGNOSIS — C7952 Secondary malignant neoplasm of bone marrow: Secondary | ICD-10-CM

## 2013-10-06 DIAGNOSIS — C7951 Secondary malignant neoplasm of bone: Secondary | ICD-10-CM | POA: Diagnosis not present

## 2013-10-06 DIAGNOSIS — Z5111 Encounter for antineoplastic chemotherapy: Secondary | ICD-10-CM | POA: Diagnosis not present

## 2013-10-06 DIAGNOSIS — C61 Malignant neoplasm of prostate: Secondary | ICD-10-CM

## 2013-10-06 LAB — CBC WITH DIFFERENTIAL/PLATELET
BASO%: 1.6 % (ref 0.0–2.0)
Basophils Absolute: 0.1 10*3/uL (ref 0.0–0.1)
EOS%: 5.5 % (ref 0.0–7.0)
Eosinophils Absolute: 0.3 10*3/uL (ref 0.0–0.5)
HEMATOCRIT: 38.6 % (ref 38.4–49.9)
HGB: 12.4 g/dL — ABNORMAL LOW (ref 13.0–17.1)
LYMPH#: 1.5 10*3/uL (ref 0.9–3.3)
LYMPH%: 27.4 % (ref 14.0–49.0)
MCH: 28.6 pg (ref 27.2–33.4)
MCHC: 32.1 g/dL (ref 32.0–36.0)
MCV: 88.9 fL (ref 79.3–98.0)
MONO#: 0.4 10*3/uL (ref 0.1–0.9)
MONO%: 7.8 % (ref 0.0–14.0)
NEUT#: 3.3 10*3/uL (ref 1.5–6.5)
NEUT%: 57.7 % (ref 39.0–75.0)
PLATELETS: 403 10*3/uL — AB (ref 140–400)
RBC: 4.34 10*6/uL (ref 4.20–5.82)
RDW: 14 % (ref 11.0–14.6)
WBC: 5.6 10*3/uL (ref 4.0–10.3)
nRBC: 0 % (ref 0–0)

## 2013-10-06 LAB — COMPREHENSIVE METABOLIC PANEL (CC13)
ALK PHOS: 161 U/L — AB (ref 40–150)
ALT: 12 U/L (ref 0–55)
AST: 22 U/L (ref 5–34)
Albumin: 3.2 g/dL — ABNORMAL LOW (ref 3.5–5.0)
Anion Gap: 9 mEq/L (ref 3–11)
BUN: 19.5 mg/dL (ref 7.0–26.0)
CO2: 30 mEq/L — ABNORMAL HIGH (ref 22–29)
CREATININE: 0.8 mg/dL (ref 0.7–1.3)
Calcium: 9.8 mg/dL (ref 8.4–10.4)
Chloride: 101 mEq/L (ref 98–109)
Glucose: 67 mg/dl — ABNORMAL LOW (ref 70–140)
Potassium: 4.1 mEq/L (ref 3.5–5.1)
Sodium: 140 mEq/L (ref 136–145)
Total Bilirubin: 0.35 mg/dL (ref 0.20–1.20)
Total Protein: 6.9 g/dL (ref 6.4–8.3)

## 2013-10-06 MED ORDER — ONDANSETRON 8 MG/NS 50 ML IVPB
INTRAVENOUS | Status: AC
  Filled 2013-10-06: qty 8

## 2013-10-06 MED ORDER — DOCETAXEL CHEMO INJECTION 160 MG/16ML
75.0000 mg/m2 | Freq: Once | INTRAVENOUS | Status: AC
  Administered 2013-10-06: 130 mg via INTRAVENOUS
  Filled 2013-10-06: qty 13

## 2013-10-06 MED ORDER — DEXAMETHASONE SODIUM PHOSPHATE 10 MG/ML IJ SOLN
INTRAMUSCULAR | Status: AC
  Filled 2013-10-06: qty 1

## 2013-10-06 MED ORDER — DEXAMETHASONE SODIUM PHOSPHATE 10 MG/ML IJ SOLN
10.0000 mg | Freq: Once | INTRAMUSCULAR | Status: AC
  Administered 2013-10-06: 10 mg via INTRAVENOUS

## 2013-10-06 MED ORDER — SODIUM CHLORIDE 0.9 % IV SOLN
Freq: Once | INTRAVENOUS | Status: AC
  Administered 2013-10-06: 12:00:00 via INTRAVENOUS

## 2013-10-06 MED ORDER — ONDANSETRON 8 MG/50ML IVPB (CHCC)
8.0000 mg | Freq: Once | INTRAVENOUS | Status: AC
  Administered 2013-10-06: 8 mg via INTRAVENOUS

## 2013-10-06 NOTE — Patient Instructions (Signed)
[facility] Discharge Instructions for Patients Receiving Chemotherapy  Today you received the following chemotherapy agents: taxotere  To help prevent nausea and vomiting after your treatment, we encourage you to take your nausea medication as prescribed.    If you develop nausea and vomiting that is not controlled by your nausea medication, call the clinic.   BELOW ARE SYMPTOMS THAT SHOULD BE REPORTED IMMEDIATELY:  *FEVER GREATER THAN 100.5 F  *CHILLS WITH OR WITHOUT FEVER  NAUSEA AND VOMITING THAT IS NOT CONTROLLED WITH YOUR NAUSEA MEDICATION  *UNUSUAL SHORTNESS OF BREATH  *UNUSUAL BRUISING OR BLEEDING  TENDERNESS IN MOUTH AND THROAT WITH OR WITHOUT PRESENCE OF ULCERS  *URINARY PROBLEMS  *BOWEL PROBLEMS  UNUSUAL RASH Items with * indicate a potential emergency and should be followed up as soon as possible.  Feel free to call the clinic you have any questions or concerns. The clinic phone number is [phone].

## 2013-10-06 NOTE — Progress Notes (Signed)
Patient completed first taxotere infusion without any problems or complications. Patient discharged home with wife and given copy of AVS. Cindi Carbon, RN

## 2013-10-07 ENCOUNTER — Ambulatory Visit (HOSPITAL_BASED_OUTPATIENT_CLINIC_OR_DEPARTMENT_OTHER): Payer: Medicare Other

## 2013-10-07 VITALS — BP 107/75 | HR 65 | Temp 97.7°F | Resp 18

## 2013-10-07 DIAGNOSIS — C7951 Secondary malignant neoplasm of bone: Secondary | ICD-10-CM

## 2013-10-07 DIAGNOSIS — C7952 Secondary malignant neoplasm of bone marrow: Secondary | ICD-10-CM

## 2013-10-07 DIAGNOSIS — C61 Malignant neoplasm of prostate: Secondary | ICD-10-CM | POA: Diagnosis not present

## 2013-10-07 DIAGNOSIS — Z5189 Encounter for other specified aftercare: Secondary | ICD-10-CM

## 2013-10-07 MED ORDER — PEGFILGRASTIM INJECTION 6 MG/0.6ML
6.0000 mg | Freq: Once | SUBCUTANEOUS | Status: AC
  Administered 2013-10-07: 6 mg via SUBCUTANEOUS

## 2013-10-07 NOTE — Patient Instructions (Signed)

## 2013-10-09 ENCOUNTER — Telehealth: Payer: Self-pay | Admitting: *Deleted

## 2013-10-09 NOTE — Telephone Encounter (Signed)
Called [name] [name] for chemotherapy F/U.  Spoke with his wife [name] [name].  Patient is doing well.  Denies n/v.  Denies any new side effects or symptoms.  Bowel and bladder is functioning well.  Eating but no appetite.  Drinking well and I instructed to drink 64 oz minimum daily or at least the day before, of and after treatment.  Denies questions at this time and encouraged to call if needed.  Reviewed how to call after hours in the case of an emergency.

## 2013-10-09 NOTE — Telephone Encounter (Signed)
Message copied by Cherylynn Ridges on Mon Oct 09, 2013  2:22 PM ------      Message from: Christa See      Created: Fri Oct 06, 2013  1:59 PM      Regarding: 1st time chemo f/u call      Contact: [phone]       Pt had first time taxotere on Friday. MD is Shadad. Can you call and see how he did over the weekend? The number is his wife's cell number per patient request. Thanks! Kristen  ------

## 2013-10-10 DIAGNOSIS — R413 Other amnesia: Secondary | ICD-10-CM | POA: Diagnosis not present

## 2013-10-10 DIAGNOSIS — R000 Tachycardia, unspecified: Secondary | ICD-10-CM | POA: Diagnosis not present

## 2013-10-10 DIAGNOSIS — C61 Malignant neoplasm of prostate: Secondary | ICD-10-CM | POA: Diagnosis not present

## 2013-10-16 ENCOUNTER — Telehealth: Payer: Self-pay | Admitting: *Deleted

## 2013-10-16 ENCOUNTER — Encounter: Payer: Self-pay | Admitting: Neurology

## 2013-10-16 ENCOUNTER — Ambulatory Visit (INDEPENDENT_AMBULATORY_CARE_PROVIDER_SITE_OTHER): Payer: Medicare Other | Admitting: Neurology

## 2013-10-16 ENCOUNTER — Encounter: Payer: Self-pay | Admitting: Oncology

## 2013-10-16 VITALS — BP 109/70 | HR 98 | Temp 98.0°F | Ht 67.0 in | Wt 138.0 lb

## 2013-10-16 DIAGNOSIS — R413 Other amnesia: Secondary | ICD-10-CM | POA: Diagnosis not present

## 2013-10-16 DIAGNOSIS — C61 Malignant neoplasm of prostate: Secondary | ICD-10-CM

## 2013-10-16 DIAGNOSIS — C7951 Secondary malignant neoplasm of bone: Secondary | ICD-10-CM

## 2013-10-16 DIAGNOSIS — C7952 Secondary malignant neoplasm of bone marrow: Secondary | ICD-10-CM | POA: Diagnosis not present

## 2013-10-16 NOTE — Telephone Encounter (Signed)
Wife calling to report, since yesterday, patient has red splotches on his face and posterior arms. Denies pain, itching,fever. Offered for her to bring him in so we may look at it and she stated that they live in  and she would try to send the picture to [facility]. Note to dr Hazeline Junker desk

## 2013-10-16 NOTE — Patient Instructions (Addendum)
You have complaints of memory loss: memory loss or changes in cognitive function can have many reasons and does not always mean you have dementia. Conditions that can contribute to subjective or objective memory loss include: depression, stress, poor sleep from insomnia or sleep apnea, dehydration, fluctuation in blood sugar values, thyroid or electrolyte dysfunction. Dementia can be causes by stroke, brain atherosclerosis and by Alzheimer's disease or other, more rare and sometimes hereditary causes. We will do some additional testing: blood work, a brain scan. We will not start medication as yet. Your memory loss is rather mild and probably in keeping with age related changes.   Please ask your oncologist about taking an multivitamin. We will also do a more detailed memory evaluation with Dr. Valentina Shaggy, I put a referral in today.

## 2013-10-16 NOTE — Progress Notes (Signed)
Subjective:    Patient ID: [name] [name] is a 73 y.o. male.  HPI    Star Age, MD, PhD [facility] [address], [address] [place], [place] [address]  Dear Dr. Nadara Mustard,   I saw your patient, [name] [name], upon your kind request in my neurologic clinic today for initial consultation of his memory loss. The patient is accompanied by his wife, today. 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 He has been seeing a urologist and cancer doctor. His urologist according to your note recently stopped a medication that caused him to have nausea and resultant loss of appetite weight loss and SEs may include memory loss and confusion. You recently started him on Remeron to see if his appetite improves. In your office, on 10/10/2013, his MMSE was noted to be 25/30, clock drawing was 4 out of 4. You have considered trying him on dementia medication such as Aricept or Namenda. Recent blood work includes a CBC, CMP, PSA, testosterone level, which I reviewed.  He has had one round of chemo 10 days ago and is due for the 2nd out of 4 treatments on 10/26/13. He has a new rash on his face and his arms. He developed this about 9 days after the chemo. He tends to pick at the lesions.   He primarily has been having difficulty with short-term memory such as forgetfulness, misplacing things, asking the same question again and forgetting dates and events and even the name of his favorite restaurant. There is no report of confusion or disorientation. Familiar faces are easily recognized. They have 2 sons, who also noted memory issues.  He reports no recurrent headaches.   There is no report of Auditory Hallucinations  and Visual Hallucinations and there are no delusions, such as paranoia.  He has been on an antidepressant, namely Elavil, for sleep. He sleeps well.  The patient denies prior TIA or stroke symptoms, such as sudden onset of one sided weakness, numbness, tingling, slurring of speech or droopy face, hearing loss, tinnitus, diplopia or visual field cut or monocular loss of vision, and denies recurrent headaches.  Of note, the patient has been snoring, mild without apneas and there are no issues with gasping. He takes a nap.    His Past Medical History Is Significant For: Past Medical History  Diagnosis Date  . Hypertension   . Hyperlipidemia   . Prostate carcinoma   . 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 BURNEY  . Anxiety   . GERD (gastroesophageal reflux disease)   . Tachycardia     history of  . Fatigue     His Past Surgical History Is Significant For: Past Surgical History  Procedure Laterality Date  . Resection bone tumor rib  [id number]    RT VAT , RESECTION OF 3rd,4th ,5th ribs with reconstruction with allograft and titanium platesDR. BURNEY  . Dental surgery      His Family History Is Significant For: Family History  Problem Relation Age of Onset  . Aneurysm Father     His Social History Is Significant For: History   Social History  . Marital Status: Married  Spouse Name: N/A    Number of Children: N/A  . Years of Education: N/A   Social History Main Topics  . Smoking status: Former Smoker -- 2.00 packs/day for 15 years    Types: Cigarettes    Quit date: 04/20/1974  . Smokeless tobacco: None  . Alcohol Use: No  . Drug Use: No  . Sexual Activity: None   Other Topics Concern  . None   Social History Narrative  . None    His Allergies Are:  Allergies  Allergen Reactions  . Oxycodone Nausea And Vomiting  . Penicillins Itching and Rash  :   His Current  Medications Are:  Outpatient Encounter Prescriptions as of 10/16/2013  Medication Sig  . acetaminophen (TYLENOL) 325 MG tablet Take 650 mg by mouth every 6 (six) hours as needed.  Marland Kitchen amitriptyline (ELAVIL) 25 MG tablet Take 25 mg by mouth at bedtime.  Marland Kitchen aspirin EC 81 MG tablet Take 81 mg by mouth at bedtime.  Marland Kitchen atenolol (TENORMIN) 25 MG tablet Take 25 mg by mouth at bedtime.   . Calcium Citrate-Vitamin D (CITRACAL + D PO) Take 3 tablets by mouth every morning.  . prochlorperazine (COMPAZINE) 10 MG tablet Take 1 tablet (10 mg total) by mouth every 6 (six) hours as needed for nausea or vomiting.  . simvastatin (ZOCOR) 80 MG tablet Take 80 mg by mouth at bedtime.    . [DISCONTINUED] ibuprofen (ADVIL,MOTRIN) 200 MG tablet Take 400 mg by mouth every 6 (six) hours as needed for pain.  . [DISCONTINUED] traMADol (ULTRAM) 50 MG tablet Take by mouth every 6 (six) hours as needed.  :  Review of Systems:  Out of a complete 14 point review of systems, all are reviewed and negative with the exception of these symptoms as listed below:    Review of Systems  Constitutional: Positive for fatigue and unexpected weight change.  HENT: Negative.   Eyes: Negative.   Respiratory: Negative.   Cardiovascular: Negative.   Gastrointestinal: Negative.   Endocrine: Negative.   Genitourinary: Negative.   Musculoskeletal: Negative.   Skin: Negative.   Allergic/Immunologic: Negative.   Neurological:       Memory loss  Hematological: Negative.   Psychiatric/Behavioral: Positive for confusion.    Objective:  Neurologic Exam  Physical Exam Physical Examination:   Filed Vitals:   10/16/13 0946  BP: 109/70  Pulse: 98  Temp: 98 F (36.7 C)    General Examination: The patient is a very pleasant 73 y.o. male in no acute distress. He is calm and cooperative with the exam. He denies Auditory Hallucinations and Visual Hallucinations. He is well groomed and situated in a chair.   HEENT: Normocephalic,  atraumatic, pupils are equal, round and reactive to light and accommodation. Funduscopic exam is normal with sharp disc margins noted. Extraocular tracking shows mild saccadic breakdown without nystagmus noted. Hearing is intact. Tympanic membranes are clear on the L and obscured with cerumen on the R. Face is symmetric with no facial masking and normal facial sensation. There is no lip, neck or jaw tremor. Neck is not rigid with intact passive ROM. There are no carotid bruits on auscultation. Oropharynx exam reveals mild mouth dryness. No significant airway crowding is noted. Mallampati is class II. Tongue protrudes centrally and palate elevates symmetrically.    Chest: is clear to auscultation without wheezing, rhonchi or crackles noted.  Heart: sounds are regular and normal without murmurs, rubs or gallops noted.   Abdomen: is soft, non-tender and non-distended  with normal bowel sounds appreciated on auscultation.  Extremities: There is no pitting edema in the distal lower extremities bilaterally. Pedal pulses are intact.   Skin: is warm and dry with no trophic changes noted. Age-related changes are noted on the skin. Multiple small papules are noted on his face and forearms, the sun-exposed areas.   Musculoskeletal: exam reveals no obvious joint deformities, tenderness or joint swelling or erythema. 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   Neurologically:  Mental status: The patient is awake and alert, paying good  attention. He is able to partially provide the history. His wife provides details. He is oriented to: person, place, time/date, situation, day of week, month of year and year. His memory, attention, language and knowledge are mildly impaired. There is no aphasia, agnosia, apraxia or anomia. There is a no degree of bradyphrenia. Speech is not hypophonic with no dysarthria noted. Mood is congruent and affect is normal.  His MMSE (Mini-Mental state exam) score is  29/30.  CDT (Clock Drawing Test) score is 4/4.  AFT (Animal Fluency Test) score is 17.   Cranial nerves are as described above under HEENT exam. In addition, shoulder shrug is normal with equal shoulder height noted.  Motor exam: Normal bulk, and strength for age is noted. Tone is not rigid with absence of cogwheeling in the bilateral extremities. There is overall no significant bradykinesia. There is no drift or rebound. There is a slight postural b/l UE tremor.   Romberg is negative. Reflexes are 1+ in the upper extremities and 1+ in the lower extremities. Toes are downgoing bilaterally. Fine motor skills: Finger taps, hand movements, and rapid alternating patting are not impaired bilaterally. Foot taps and foot agility are not impaired bilaterally.   Cerebellar testing shows no dysmetria or intention tremor on finger to nose testing. Heel to shin is unremarkable. There is no truncal or gait ataxia.   Sensory exam is intact to light touch, pinprick, vibration, temperature sense and proprioception in the upper and lower extremities.   Gait, station and balance: He stands up from the seated position with no difficulty and needs no assistance. No veering to one side is noted. No leaning to one side. Posture is mildly stooped. Stance is narrow-based. He turns en bloc. Tandem walk is slightly difficult for him and so is heel stance. Balance is slightly impaired.   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 His history and physical exam and memory scores are in keeping with mild memory loss, possibly mild cognitive impairment. While this could be in keeping with his underlying significant medical history,  milder beginning dementia cannot be excluded at this time. I would like to proceed with a brain MRI with and without contrast given his history of metastatic cancer. I would also like to get a more detailed memory evaluation in the form of cognitive testing done. He is advised that I would like to refer him to a neuropsychologist for this. They were in agreement. Furthermore, I did not suggest any new medication just yet but we may embark on a trial of something like Aricept or related medications to see if we can achieve some improvement, if he tolerates the  medication. Furthermore, we will do some additional blood work today. We will call him with his test results. I will see him back soon after these tests are done and we will pick up our discussion about potentially trying a memory medication. I have had a long chat with the patient and his wife about my findings and the diagnosis of memory loss and dementia, its prognosis and treatment options. Implications of diagnosis explained at length with the patient and caregiver. We talked about medical treatments and non-pharmacological approaches. We talked about maintaining a healthy lifestyle in general and staying active mentally and physically. I encouraged the patient to eat healthy, exercise daily and keep well hydrated, to keep a scheduled bedtime and wake time routine, to not skip any meals and eat healthy snacks in between meals and to have protein with every meal. I stressed the importance of regular exercise, within of course the patient's own mobility limitations. I encouraged the patient to keep up with current events by reading the news paper or watching the news and to do word puzzles, or if feasible, to go on [email or web address].   I answered all their questions today and the patient and his wife were in agreement with the above outlined plan. Thank you very much for allowing me to participate in the care of this nice patient. If I can be of any further  assistance to you please do not hesitate to call me at [phone].  Sincerely,   Star Age, MD, PhD

## 2013-10-17 NOTE — Progress Notes (Signed)
Quick Note:  Please call patient and advise him that his labs looked fine with the exception of a borderline diabetes marker, called hemoglobin A1c which was 6.2, indicating an increased risk for diabetes but not typically frank diabetes. He can discuss his risk for diabetes with his primary care physician next time. Otherwise we checked thyroid screen, vitamin B1, inflammatory markers and ammonia level all of which were fine. No further action is required at this time.  Star Age, MD, PhD [facility] ([facility]   ______

## 2013-10-18 LAB — AMMONIA: Ammonia: 38 ug/dL (ref 27–102)

## 2013-10-18 LAB — VITAMIN B1, WHOLE BLOOD: THIAMINE: 141.5 nmol/L (ref 66.5–200.0)

## 2013-10-18 LAB — HGB A1C W/O EAG: Hgb A1c MFr Bld: 6.2 % — ABNORMAL HIGH (ref 4.8–5.6)

## 2013-10-18 LAB — TSH: TSH: 2.15 u[IU]/mL (ref 0.450–4.500)

## 2013-10-18 LAB — RPR: SYPHILIS RPR SCR: NONREACTIVE

## 2013-10-18 LAB — ANA W/REFLEX: Anti Nuclear Antibody(ANA): NEGATIVE

## 2013-10-18 LAB — SEDIMENTATION RATE: SED RATE: 5 mm/h (ref 0–30)

## 2013-10-18 NOTE — Progress Notes (Signed)
Quick Note:  Shared lab results with patient's wife , she verbalized understanding ______

## 2013-10-26 ENCOUNTER — Ambulatory Visit (HOSPITAL_BASED_OUTPATIENT_CLINIC_OR_DEPARTMENT_OTHER): Payer: Medicare Other | Admitting: Oncology

## 2013-10-26 ENCOUNTER — Ambulatory Visit (HOSPITAL_BASED_OUTPATIENT_CLINIC_OR_DEPARTMENT_OTHER): Payer: Medicare Other

## 2013-10-26 ENCOUNTER — Telehealth: Payer: Self-pay | Admitting: Oncology

## 2013-10-26 ENCOUNTER — Other Ambulatory Visit (HOSPITAL_BASED_OUTPATIENT_CLINIC_OR_DEPARTMENT_OTHER): Payer: Medicare Other

## 2013-10-26 ENCOUNTER — Encounter: Payer: Self-pay | Admitting: Oncology

## 2013-10-26 VITALS — BP 102/61 | HR 85 | Temp 97.3°F | Resp 18 | Ht 67.0 in | Wt 138.6 lb

## 2013-10-26 DIAGNOSIS — C7951 Secondary malignant neoplasm of bone: Secondary | ICD-10-CM | POA: Diagnosis not present

## 2013-10-26 DIAGNOSIS — Z5111 Encounter for antineoplastic chemotherapy: Secondary | ICD-10-CM

## 2013-10-26 DIAGNOSIS — C61 Malignant neoplasm of prostate: Secondary | ICD-10-CM | POA: Diagnosis not present

## 2013-10-26 DIAGNOSIS — C7952 Secondary malignant neoplasm of bone marrow: Secondary | ICD-10-CM

## 2013-10-26 LAB — CBC WITH DIFFERENTIAL/PLATELET
BASO%: 1.4 % (ref 0.0–2.0)
BASOS ABS: 0.1 10*3/uL (ref 0.0–0.1)
EOS%: 1.2 % (ref 0.0–7.0)
Eosinophils Absolute: 0.1 10*3/uL (ref 0.0–0.5)
HCT: 36.8 % — ABNORMAL LOW (ref 38.4–49.9)
HGB: 11.9 g/dL — ABNORMAL LOW (ref 13.0–17.1)
LYMPH%: 18.8 % (ref 14.0–49.0)
MCH: 29.1 pg (ref 27.2–33.4)
MCHC: 32.5 g/dL (ref 32.0–36.0)
MCV: 89.5 fL (ref 79.3–98.0)
MONO#: 0.8 10*3/uL (ref 0.1–0.9)
MONO%: 9.5 % (ref 0.0–14.0)
NEUT#: 5.7 10*3/uL (ref 1.5–6.5)
NEUT%: 69.1 % (ref 39.0–75.0)
Platelets: 389 10*3/uL (ref 140–400)
RBC: 4.11 10*6/uL — AB (ref 4.20–5.82)
RDW: 16.6 % — AB (ref 11.0–14.6)
WBC: 8.2 10*3/uL (ref 4.0–10.3)
lymph#: 1.5 10*3/uL (ref 0.9–3.3)

## 2013-10-26 LAB — COMPREHENSIVE METABOLIC PANEL (CC13)
ALK PHOS: 143 U/L (ref 40–150)
ALT: 14 U/L (ref 0–55)
AST: 22 U/L (ref 5–34)
Albumin: 3.6 g/dL (ref 3.5–5.0)
Anion Gap: 7 mEq/L (ref 3–11)
BUN: 18.5 mg/dL (ref 7.0–26.0)
CO2: 27 mEq/L (ref 22–29)
Calcium: 9.8 mg/dL (ref 8.4–10.4)
Chloride: 105 mEq/L (ref 98–109)
Creatinine: 0.8 mg/dL (ref 0.7–1.3)
Glucose: 80 mg/dl (ref 70–140)
Potassium: 4.3 mEq/L (ref 3.5–5.1)
SODIUM: 138 meq/L (ref 136–145)
Total Bilirubin: 0.87 mg/dL (ref 0.20–1.20)
Total Protein: 6.9 g/dL (ref 6.4–8.3)

## 2013-10-26 MED ORDER — SODIUM CHLORIDE 0.9 % IV SOLN
Freq: Once | INTRAVENOUS | Status: AC
  Administered 2013-10-26: 13:00:00 via INTRAVENOUS

## 2013-10-26 MED ORDER — DEXAMETHASONE SODIUM PHOSPHATE 10 MG/ML IJ SOLN
INTRAMUSCULAR | Status: AC
  Filled 2013-10-26: qty 1

## 2013-10-26 MED ORDER — ONDANSETRON 8 MG/NS 50 ML IVPB
INTRAVENOUS | Status: AC
  Filled 2013-10-26: qty 8

## 2013-10-26 MED ORDER — DOCETAXEL CHEMO INJECTION 160 MG/16ML
75.0000 mg/m2 | Freq: Once | INTRAVENOUS | Status: AC
  Administered 2013-10-26: 130 mg via INTRAVENOUS
  Filled 2013-10-26: qty 13

## 2013-10-26 MED ORDER — DEXAMETHASONE SODIUM PHOSPHATE 10 MG/ML IJ SOLN
10.0000 mg | Freq: Once | INTRAMUSCULAR | Status: AC
  Administered 2013-10-26: 10 mg via INTRAVENOUS

## 2013-10-26 MED ORDER — ONDANSETRON 8 MG/50ML IVPB (CHCC)
8.0000 mg | Freq: Once | INTRAVENOUS | Status: AC
  Administered 2013-10-26: 8 mg via INTRAVENOUS

## 2013-10-26 NOTE — Patient Instructions (Signed)
[facility] Discharge Instructions for Patients Receiving Chemotherapy  Today you received the following chemotherapy agents Taxotere.  To help prevent nausea and vomiting after your treatment, we encourage you to take your nausea medication Compazine 10 mg every 6 hours as needed OR thirty minutes before meals and at bedtime if needed.   If you develop nausea and vomiting that is not controlled by your nausea medication, call the clinic.   BELOW ARE SYMPTOMS THAT SHOULD BE REPORTED IMMEDIATELY:  *FEVER GREATER THAN 100.5 F  *CHILLS WITH OR WITHOUT FEVER  NAUSEA AND VOMITING THAT IS NOT CONTROLLED WITH YOUR NAUSEA MEDICATION  *UNUSUAL SHORTNESS OF BREATH  *UNUSUAL BRUISING OR BLEEDING  TENDERNESS IN MOUTH AND THROAT WITH OR WITHOUT PRESENCE OF ULCERS  *URINARY PROBLEMS  *BOWEL PROBLEMS  UNUSUAL RASH Items with * indicate a potential emergency and should be followed up as soon as possible.  Feel free to call the clinic you have any questions or concerns. The clinic phone number is [phone].

## 2013-10-26 NOTE — Progress Notes (Signed)
Hematology and Oncology Follow Up Visit  [name] [name] [id number] [birth year] 73 y.o. 10/26/2013 11:50 AM   Principle Diagnosis: 73 year old gentleman with castration resistant metastatic prostate cancer with disease to the bone. His initial diagnosis was in 2012 where he presented with metastatic disease to the rib. His PSA was 187.   Prior Therapy:  He was found to have a mass in the right axilla near the fourth rib encasing the right with some sclerosis. He underwent a right VATS procedure and resection of the third, fourth and fifth rib and reconstruction with titanium plates under the care of Dr. Arlyce Dice on November 13 2010.  He went on to receive androgen deprivation therapy under the care of Dr. Gaynelle Arabian. His PSA nadir was to 0.1 back in May of 2013 and subsequently started to rise.  He was treated with Provenge in the fall of 2014.  After his PSA went up to 178.6 and Xtandi was started in February of 2015 and discontinued in 09/2013 due to progression of disease and poor tolerance.  Current therapy: Taxotere chemotherapy started on 10/05/2013. He is here for cycle 2.  Interim History:  [name] [name] presents today for a followup visit with his wife. Since his last visit, he tolerated the first cycle of chemotherapy without any complications. He did not report any nausea or vomiting. Did not report any peripheral neuropathy. Did not report any infusion-related complications. He did have mild headaches associated with Neulasta but otherwise no other complications. His quality of life has slightly improved and his pain has also improved as well. His mobility and performance status remains the same at this time. He did not report any headaches or blurry vision or double vision. Has not reported any syncope or change in his mental status. He does have memory issues and is currently under evaluation for that. He is not reporting any chest pain or shortness of breath. Is not reporting any cough or  hemoptysis. Is that report any palpitation or leg edema. Does not report any frequency urgency or hesitancy. Does not report any skeletal complications. Does not report any lymphadenopathy or petechiae. Rest of his review of systems unremarkable.  Medications: I have reviewed the patient's current medications.  Current Outpatient Prescriptions  Medication Sig Dispense Refill  . acetaminophen (TYLENOL) 325 MG tablet Take 650 mg by mouth every 6 (six) hours as needed.      Marland Kitchen amitriptyline (ELAVIL) 25 MG tablet Take 25 mg by mouth at bedtime.      Marland Kitchen aspirin EC 81 MG tablet Take 81 mg by mouth at bedtime.      Marland Kitchen atenolol (TENORMIN) 25 MG tablet Take 25 mg by mouth at bedtime.       . Calcium Citrate-Vitamin D (CITRACAL + D PO) Take 3 tablets by mouth every morning.      . prochlorperazine (COMPAZINE) 10 MG tablet Take 1 tablet (10 mg total) by mouth every 6 (six) hours as needed for nausea or vomiting.  30 tablet  0  . simvastatin (ZOCOR) 80 MG tablet Take 80 mg by mouth at bedtime.         No current facility-administered medications for this visit.     Allergies:  Allergies  Allergen Reactions  . Oxycodone Nausea And Vomiting  . Penicillins Itching and Rash    Past Medical History, Surgical history, Social history, and Family History were reviewed and updated.   Physical Exam: Blood pressure 102/61, pulse 85, temperature 97.3 F (36.3 C), temperature source  Oral, resp. rate 18, height 5\' 7"  (1.702 m), weight 138 lb 9.6 oz (62.869 kg), SpO2 100.00%. ECOG:  1 General appearance: alert and cooperative Head: Normocephalic, without obvious abnormality Neck: no adenopathy Lymph nodes: Cervical, supraclavicular, and axillary nodes normal. Heart:regular rate and rhythm, S1, S2 normal, no murmur, click, rub or gallop Lung:chest clear, no wheezing, rales, normal symmetric air entry Abdomin: soft, non-tender, without masses or organomegaly EXT:no erythema, induration, or nodules   Lab  Results: Lab Results  Component Value Date   WBC 8.2 10/26/2013   HGB 11.9* 10/26/2013   HCT 36.8* 10/26/2013   MCV 89.5 10/26/2013   PLT 389 10/26/2013     Chemistry      Component Value Date/Time   NA 138 10/26/2013 1056   NA 136 02/06/2013 1157   K 4.3 10/26/2013 1056   K 3.9 02/06/2013 1157   CL 98 02/06/2013 1157   CO2 27 10/26/2013 1056   CO2 28 02/06/2013 1157   BUN 18.5 10/26/2013 1056   BUN 15 02/06/2013 1157   CREATININE 0.8 10/26/2013 1056   CREATININE 0.87 02/06/2013 1157      Component Value Date/Time   CALCIUM 9.8 10/26/2013 1056   CALCIUM 10.2 02/06/2013 1157   ALKPHOS 143 10/26/2013 1056   ALKPHOS 82 11/15/2010 0300   AST 22 10/26/2013 1056   AST 31 11/15/2010 0300   ALT 14 10/26/2013 1056   ALT 16 11/15/2010 0300   BILITOT 0.87 10/26/2013 1056   BILITOT 0.5 11/15/2010 0300      Impression and Plan:  73 year old gentleman with the following issues:  1. 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 He is currently receiving Taxotere chemotherapy and ready to proceed with the second cycle. He tolerated the first cycle well and his PSA is currently pending. The plan is to proceed with cycle 2 of chemotherapy today and cycle 3 will be in 3 weeks' time. We will continue to monitor his PSA with cavity treatment.  2.Bone pain: We well hold off on palliative radiation therapy unless he develops increased and his pain that is refractory to narcotic analgesics and certainly if he develops any skeletal related events. His pain seems to be improved with chemotherapy so far.   3. Bone directed therapy: We will address that in future visits he'll be a good candidate for Xgeva I will discuss that with him in future visits.   4. Nausea and vomiting: Likely related to Xtandi medication and seems to be improving after stopping this current medication. He is currently prescribed antiemetics to use.  5. IV access: I continue to discuss with him the role of  Port-A-Cath and he would like to continue with peripheral veins for the time being.  6. Neutropenia prophylaxis: He will continue Neulasta after each chemotherapy cycle  7. Followup: Will be in 3 weeks for the next chemotherapy cycle.       [facility], MD 7/9/201511:50 AM

## 2013-10-26 NOTE — Telephone Encounter (Signed)
gv adn printed appt sched and avs for pt for July and Aug....sed added tx. °

## 2013-10-26 NOTE — Progress Notes (Signed)
Discharged,ambulatory in no distress at 1445 with spouse

## 2013-10-27 ENCOUNTER — Telehealth: Payer: Self-pay | Admitting: Medical Oncology

## 2013-10-27 ENCOUNTER — Ambulatory Visit (HOSPITAL_BASED_OUTPATIENT_CLINIC_OR_DEPARTMENT_OTHER): Payer: Medicare Other

## 2013-10-27 VITALS — BP 110/68 | HR 92 | Temp 98.1°F

## 2013-10-27 DIAGNOSIS — Z5189 Encounter for other specified aftercare: Secondary | ICD-10-CM | POA: Diagnosis not present

## 2013-10-27 DIAGNOSIS — C7951 Secondary malignant neoplasm of bone: Secondary | ICD-10-CM | POA: Diagnosis not present

## 2013-10-27 DIAGNOSIS — C61 Malignant neoplasm of prostate: Secondary | ICD-10-CM

## 2013-10-27 DIAGNOSIS — C7952 Secondary malignant neoplasm of bone marrow: Secondary | ICD-10-CM

## 2013-10-27 LAB — PSA: PSA: 10.13 ng/mL — ABNORMAL HIGH (ref <=4.00)

## 2013-10-27 MED ORDER — PEGFILGRASTIM INJECTION 6 MG/0.6ML
6.0000 mg | Freq: Once | SUBCUTANEOUS | Status: AC
  Administered 2013-10-27: 6 mg via SUBCUTANEOUS
  Filled 2013-10-27: qty 0.6

## 2013-10-27 NOTE — Telephone Encounter (Signed)
Message copied by Maxwell Marion on Fri Oct 27, 2013 10:23 AM ------      Message from: Wyatt Portela      Created: Fri Oct 27, 2013  9:04 AM       Please call his PSA. ------

## 2013-10-27 NOTE — Telephone Encounter (Signed)
Per MD, informed patient of PSA results. Denies questions.

## 2013-10-28 ENCOUNTER — Ambulatory Visit
Admission: RE | Admit: 2013-10-28 | Discharge: 2013-10-28 | Disposition: A | Discharge status: Home / Self Care | Payer: Medicare Other | Source: Ambulatory Visit | Attending: Neurology | Admitting: Neurology

## 2013-10-28 DIAGNOSIS — R413 Other amnesia: Secondary | ICD-10-CM

## 2013-10-28 DIAGNOSIS — C7951 Secondary malignant neoplasm of bone: Secondary | ICD-10-CM | POA: Diagnosis not present

## 2013-10-28 DIAGNOSIS — C61 Malignant neoplasm of prostate: Secondary | ICD-10-CM

## 2013-10-28 DIAGNOSIS — C7952 Secondary malignant neoplasm of bone marrow: Secondary | ICD-10-CM | POA: Diagnosis not present

## 2013-10-28 MED ORDER — GADOBENATE DIMEGLUMINE 529 MG/ML IV SOLN
8.0000 mL | Freq: Once | INTRAVENOUS | Status: AC | PRN
  Administered 2013-10-28: 8 mL via INTRAVENOUS

## 2013-11-06 NOTE — Progress Notes (Signed)
Quick Note:  Please call patient regarding the recent brain MRI: The brain scan showed a normal structure of the brain and mild volume loss which we call atrophy. While some a mild of volume loss is expected with age, his brain atrophy is mildly advanced for age but again this is in the mild category per report. Nothing specific needs to be done in that regard at this time.  In additions, there were changes in the deeper structures of the brain, which we call white matter changes or microvascular changes. These were reported as mild in His case. These are tiny white spots, that occur with time and are seen in a variety of conditions, including with normal aging, chronic hypertension, chronic headaches, especially migraine HAs, chronic diabetes, chronic hyperlipidemia. These are not strokes and no mass or lesion or contrast enhancement was seen which is reassuring. Again, there were no acute findings, such as a stroke, or mass or blood products. No further action is required on this test at this time, other than re-enforcing the importance of good blood pressure control, good cholesterol control, good blood sugar control, and weight management. Please remind patient to keep any upcoming appointments or tests and to call us with any interim questions, concerns, problems or updates. Thanks,  Star Age, MD, PhD    ______

## 2013-11-08 NOTE — Progress Notes (Signed)
Quick Note:  I called and spoke to wife, [name] [name]. Results of MRI given (SVD, Atrophy - mildly advanced for age). She verbalized understanding. Nothing to be done. Confirmed appt in December. Will call back sooner if needed. ______

## 2013-11-16 ENCOUNTER — Ambulatory Visit (HOSPITAL_BASED_OUTPATIENT_CLINIC_OR_DEPARTMENT_OTHER): Payer: Medicare Other | Admitting: Physician Assistant

## 2013-11-16 ENCOUNTER — Other Ambulatory Visit (HOSPITAL_BASED_OUTPATIENT_CLINIC_OR_DEPARTMENT_OTHER): Payer: Medicare Other

## 2013-11-16 ENCOUNTER — Ambulatory Visit (HOSPITAL_BASED_OUTPATIENT_CLINIC_OR_DEPARTMENT_OTHER): Payer: Medicare Other

## 2013-11-16 VITALS — BP 97/67 | HR 88 | Temp 97.5°F | Resp 18 | Ht 67.0 in | Wt 139.2 lb

## 2013-11-16 DIAGNOSIS — C7951 Secondary malignant neoplasm of bone: Secondary | ICD-10-CM

## 2013-11-16 DIAGNOSIS — C7952 Secondary malignant neoplasm of bone marrow: Secondary | ICD-10-CM | POA: Diagnosis not present

## 2013-11-16 DIAGNOSIS — C61 Malignant neoplasm of prostate: Secondary | ICD-10-CM

## 2013-11-16 DIAGNOSIS — Z5111 Encounter for antineoplastic chemotherapy: Secondary | ICD-10-CM | POA: Diagnosis not present

## 2013-11-16 LAB — CBC WITH DIFFERENTIAL/PLATELET
BASO%: 1.3 % (ref 0.0–2.0)
BASOS ABS: 0.1 10*3/uL (ref 0.0–0.1)
EOS ABS: 0.1 10*3/uL (ref 0.0–0.5)
EOS%: 1.6 % (ref 0.0–7.0)
HCT: 36.8 % — ABNORMAL LOW (ref 38.4–49.9)
HEMOGLOBIN: 11.6 g/dL — AB (ref 13.0–17.1)
LYMPH%: 21.1 % (ref 14.0–49.0)
MCH: 28.6 pg (ref 27.2–33.4)
MCHC: 31.6 g/dL — ABNORMAL LOW (ref 32.0–36.0)
MCV: 90.7 fL (ref 79.3–98.0)
MONO#: 0.9 10*3/uL (ref 0.1–0.9)
MONO%: 11.2 % (ref 0.0–14.0)
NEUT#: 5.2 10*3/uL (ref 1.5–6.5)
NEUT%: 64.8 % (ref 39.0–75.0)
PLATELETS: 273 10*3/uL (ref 140–400)
RBC: 4.05 10*6/uL — AB (ref 4.20–5.82)
RDW: 18 % — ABNORMAL HIGH (ref 11.0–14.6)
WBC: 8 10*3/uL (ref 4.0–10.3)
lymph#: 1.7 10*3/uL (ref 0.9–3.3)

## 2013-11-16 LAB — COMPREHENSIVE METABOLIC PANEL (CC13)
ALT: 12 U/L (ref 0–55)
AST: 22 U/L (ref 5–34)
Albumin: 3.4 g/dL — ABNORMAL LOW (ref 3.5–5.0)
Alkaline Phosphatase: 116 U/L (ref 40–150)
Anion Gap: 5 mEq/L (ref 3–11)
BILIRUBIN TOTAL: 0.68 mg/dL (ref 0.20–1.20)
BUN: 17.8 mg/dL (ref 7.0–26.0)
CO2: 27 mEq/L (ref 22–29)
CREATININE: 0.7 mg/dL (ref 0.7–1.3)
Calcium: 9.4 mg/dL (ref 8.4–10.4)
Chloride: 105 mEq/L (ref 98–109)
GLUCOSE: 103 mg/dL (ref 70–140)
Potassium: 4 mEq/L (ref 3.5–5.1)
Sodium: 137 mEq/L (ref 136–145)
Total Protein: 6.3 g/dL — ABNORMAL LOW (ref 6.4–8.3)

## 2013-11-16 MED ORDER — DEXAMETHASONE SODIUM PHOSPHATE 10 MG/ML IJ SOLN
INTRAMUSCULAR | Status: AC
  Filled 2013-11-16: qty 1

## 2013-11-16 MED ORDER — SODIUM CHLORIDE 0.9 % IV SOLN
Freq: Once | INTRAVENOUS | Status: AC
  Administered 2013-11-16: 13:00:00 via INTRAVENOUS

## 2013-11-16 MED ORDER — DEXAMETHASONE SODIUM PHOSPHATE 10 MG/ML IJ SOLN
10.0000 mg | Freq: Once | INTRAMUSCULAR | Status: AC
  Administered 2013-11-16: 10 mg via INTRAVENOUS

## 2013-11-16 MED ORDER — ONDANSETRON 8 MG/50ML IVPB (CHCC)
8.0000 mg | Freq: Once | INTRAVENOUS | Status: AC
  Administered 2013-11-16: 8 mg via INTRAVENOUS

## 2013-11-16 MED ORDER — ONDANSETRON 8 MG/NS 50 ML IVPB
INTRAVENOUS | Status: AC
  Filled 2013-11-16: qty 8

## 2013-11-16 MED ORDER — DOCETAXEL CHEMO INJECTION 160 MG/16ML
75.0000 mg/m2 | Freq: Once | INTRAVENOUS | Status: AC
  Administered 2013-11-16: 130 mg via INTRAVENOUS
  Filled 2013-11-16: qty 13

## 2013-11-16 NOTE — Patient Instructions (Signed)
[facility] Discharge Instructions for Patients Receiving Chemotherapy  Today you received the following chemotherapy agents: Txol  To help prevent nausea and vomiting after your treatment, we encourage you to take your nausea medication : Compazine 10 mg every 6 hours as needed.   If you develop nausea and vomiting that is not controlled by your nausea medication, call the clinic.   BELOW ARE SYMPTOMS THAT SHOULD BE REPORTED IMMEDIATELY:  *FEVER GREATER THAN 100.5 F  *CHILLS WITH OR WITHOUT FEVER  NAUSEA AND VOMITING THAT IS NOT CONTROLLED WITH YOUR NAUSEA MEDICATION  *UNUSUAL SHORTNESS OF BREATH  *UNUSUAL BRUISING OR BLEEDING  TENDERNESS IN MOUTH AND THROAT WITH OR WITHOUT PRESENCE OF ULCERS  *URINARY PROBLEMS  *BOWEL PROBLEMS  UNUSUAL RASH Items with * indicate a potential emergency and should be followed up as soon as possible.  Feel free to call the clinic you have any questions or concerns. The clinic phone number is [phone].

## 2013-11-17 ENCOUNTER — Ambulatory Visit (HOSPITAL_BASED_OUTPATIENT_CLINIC_OR_DEPARTMENT_OTHER): Payer: Medicare Other

## 2013-11-17 VITALS — BP 113/63 | HR 93 | Temp 97.7°F

## 2013-11-17 DIAGNOSIS — C61 Malignant neoplasm of prostate: Secondary | ICD-10-CM | POA: Diagnosis not present

## 2013-11-17 DIAGNOSIS — C7951 Secondary malignant neoplasm of bone: Secondary | ICD-10-CM | POA: Diagnosis not present

## 2013-11-17 DIAGNOSIS — Z5189 Encounter for other specified aftercare: Secondary | ICD-10-CM | POA: Diagnosis not present

## 2013-11-17 DIAGNOSIS — C7952 Secondary malignant neoplasm of bone marrow: Secondary | ICD-10-CM

## 2013-11-17 LAB — PSA: PSA: 2 ng/mL (ref <=4.00)

## 2013-11-17 MED ORDER — PEGFILGRASTIM INJECTION 6 MG/0.6ML
6.0000 mg | Freq: Once | SUBCUTANEOUS | Status: AC
  Administered 2013-11-17: 6 mg via SUBCUTANEOUS
  Filled 2013-11-17: qty 0.6

## 2013-11-21 NOTE — Progress Notes (Signed)
Hematology and Oncology Follow Up Visit  [name] [name] [id number] [birth year] 73 y.o. 11/21/2013 10:24 PM   Principle Diagnosis: 73 year old gentleman with castration resistant metastatic prostate cancer with disease to the bone. His initial diagnosis was in 2012 where he presented with metastatic disease to the rib. His PSA was 187.   Prior Therapy:  He was found to have a mass in the right axilla near the fourth rib encasing the right with some sclerosis. He underwent a right VATS procedure and resection of the third, fourth and fifth rib and reconstruction with titanium plates under the care of Dr. Arlyce Dice on November 13 2010.  He went on to receive androgen deprivation therapy under the care of Dr. Gaynelle Arabian. His PSA nadir was to 0.1 back in May of 2013 and subsequently started to rise.  He was treated with Provenge in the fall of 2014.  After his PSA went up to 178.6 and Xtandi was started in February of 2015 and discontinued in 09/2013 due to progression of disease and poor tolerance.  Current therapy: Taxotere chemotherapy started on 10/05/2013. He is here for cycle 3.  Interim History:  [name] [name] presents today for a followup visit with his wife. Since his last visit, he reports tolerating the first 2 cycles of chemotherapy without difficulty or complications. He did not report any nausea or vomiting. Did not report any peripheral neuropathy. Did not report any infusion-related complications. He did have mild headaches associated with Neulasta but otherwise no other complications. He reports some improvement in his quality of life and some reduction in pain. His mobility and performance status remains the same at this time. He did not report any headaches or blurry vision or double vision. Has not reported any syncope or change in his mental status. He does have memory issues and is currently under evaluation for that. He is not reporting any chest pain or shortness of breath. Is not reporting  any cough or hemoptysis. Is that report any palpitation or leg edema. Does not report any frequency urgency or hesitancy. Does not report any skeletal complications. Does not report any lymphadenopathy or petechiae. Remainder of his review of systems unremarkable.  Medications: I have reviewed the patient's current medications.  Current Outpatient Prescriptions  Medication Sig Dispense Refill  . amitriptyline (ELAVIL) 25 MG tablet Take 25 mg by mouth at bedtime.      Marland Kitchen aspirin EC 81 MG tablet Take 81 mg by mouth at bedtime.      Marland Kitchen atenolol (TENORMIN) 25 MG tablet Take 25 mg by mouth at bedtime.       . Calcium Citrate-Vitamin D (CITRACAL + D PO) Take 3 tablets by mouth every morning.      . mirtazapine (REMERON) 15 MG tablet Take 15 mg by mouth at bedtime.      . simvastatin (ZOCOR) 80 MG tablet Take 80 mg by mouth at bedtime.        Marland Kitchen acetaminophen (TYLENOL) 325 MG tablet Take 650 mg by mouth every 6 (six) hours as needed.      . prochlorperazine (COMPAZINE) 10 MG tablet Take 1 tablet (10 mg total) by mouth every 6 (six) hours as needed for nausea or vomiting.  30 tablet  0   No current facility-administered medications for this visit.     Allergies:  Allergies  Allergen Reactions  . Oxycodone Nausea And Vomiting  . Penicillins Itching and Rash    Past Medical History, Surgical history, Social history, and Family History  were reviewed and updated.   Physical Exam: Blood pressure 97/67, pulse 88, temperature 97.5 F (36.4 C), temperature source Oral, resp. rate 18, height 5\' 7"  (1.702 m), weight 139 lb 3.2 oz (63.141 kg). ECOG:  1 General appearance: alert and cooperative Head: Normocephalic, without obvious abnormality Neck: no adenopathy Lymph nodes: Cervical, supraclavicular, and axillary nodes normal. Heart:regular rate and rhythm, S1, S2 normal, no murmur, click, rub or gallop Lung:chest clear, no wheezing, rales, normal symmetric air entry Abdomin: soft, non-tender,  without masses or organomegaly EXT:no erythema, induration, or nodules   Lab Results: Lab Results  Component Value Date   WBC 8.0 11/16/2013   HGB 11.6* 11/16/2013   HCT 36.8* 11/16/2013   MCV 90.7 11/16/2013   PLT 273 11/16/2013     Chemistry      Component Value Date/Time   NA 137 11/16/2013 1144   NA 136 02/06/2013 1157   K 4.0 11/16/2013 1144   K 3.9 02/06/2013 1157   CL 98 02/06/2013 1157   CO2 27 11/16/2013 1144   CO2 28 02/06/2013 1157   BUN 17.8 11/16/2013 1144   BUN 15 02/06/2013 1157   CREATININE 0.7 11/16/2013 1144   CREATININE 0.87 02/06/2013 1157      Component Value Date/Time   CALCIUM 9.4 11/16/2013 1144   CALCIUM 10.2 02/06/2013 1157   ALKPHOS 116 11/16/2013 1144   ALKPHOS 82 11/15/2010 0300   AST 22 11/16/2013 1144   AST 31 11/15/2010 0300   ALT 12 11/16/2013 1144   ALT 16 11/15/2010 0300   BILITOT 0.68 11/16/2013 1144   BILITOT 0.5 11/15/2010 0300      Impression and Plan:  73 year old gentleman with the following issues:  1. 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 He is currently receiving Taxotere chemotherapy and ready to proceed with the second cycle. He tolerated the first cycle well and his PSA is currently pending. The plan is to proceed with cycle 3 of chemotherapy today and cycle 4 will be in 3 weeks. We will continue to monitor his PSA with every treatment.  2.Bone pain: We well hold off on palliative radiation therapy unless he develops increased and his pain that is refractory to narcotic analgesics and certainly if he develops any skeletal related events. His pain seems to be improved with chemotherapy so far.   3. Bone directed therapy: We will address that in future visits he'll be a good candidate for Delton See this will be discussed with him in future visits.   4. Nausea and vomiting: resolved  He is currently prescribed antiemetics to use.  5. IV access: We will continue to discuss with him the role of  Port-A-Cath, he would like to continue with peripheral veins for the time being.  6. Neutropenia prophylaxis: He will continue Neulasta after each chemotherapy cycle  7. Followup: Will be in 3 weeks for the next chemotherapy cycle.       Carlton Adam, MD 8/4/201510:24 PM

## 2013-11-21 NOTE — Patient Instructions (Signed)
Follow up in 3 weeks, prior to the start of your next scheduled cycle of chemotherapy 

## 2013-11-29 DIAGNOSIS — I959 Hypotension, unspecified: Secondary | ICD-10-CM | POA: Diagnosis not present

## 2013-11-29 DIAGNOSIS — C61 Malignant neoplasm of prostate: Secondary | ICD-10-CM | POA: Diagnosis not present

## 2013-11-29 DIAGNOSIS — R413 Other amnesia: Secondary | ICD-10-CM | POA: Diagnosis not present

## 2013-11-29 DIAGNOSIS — R634 Abnormal weight loss: Secondary | ICD-10-CM | POA: Diagnosis not present

## 2013-12-07 ENCOUNTER — Encounter: Payer: Self-pay | Admitting: Oncology

## 2013-12-07 ENCOUNTER — Ambulatory Visit (HOSPITAL_BASED_OUTPATIENT_CLINIC_OR_DEPARTMENT_OTHER): Payer: Medicare Other

## 2013-12-07 ENCOUNTER — Encounter: Payer: Self-pay | Admitting: Medical Oncology

## 2013-12-07 ENCOUNTER — Telehealth: Payer: Self-pay | Admitting: Oncology

## 2013-12-07 ENCOUNTER — Ambulatory Visit (HOSPITAL_BASED_OUTPATIENT_CLINIC_OR_DEPARTMENT_OTHER): Payer: Medicare Other | Admitting: Oncology

## 2013-12-07 ENCOUNTER — Other Ambulatory Visit (HOSPITAL_BASED_OUTPATIENT_CLINIC_OR_DEPARTMENT_OTHER): Payer: Medicare Other

## 2013-12-07 VITALS — BP 112/68 | HR 91 | Temp 98.7°F | Resp 18 | Ht 67.0 in | Wt 141.2 lb

## 2013-12-07 DIAGNOSIS — Z5111 Encounter for antineoplastic chemotherapy: Secondary | ICD-10-CM | POA: Diagnosis not present

## 2013-12-07 DIAGNOSIS — C7951 Secondary malignant neoplasm of bone: Secondary | ICD-10-CM | POA: Diagnosis not present

## 2013-12-07 DIAGNOSIS — M949 Disorder of cartilage, unspecified: Secondary | ICD-10-CM | POA: Diagnosis not present

## 2013-12-07 DIAGNOSIS — C61 Malignant neoplasm of prostate: Secondary | ICD-10-CM

## 2013-12-07 DIAGNOSIS — R112 Nausea with vomiting, unspecified: Secondary | ICD-10-CM

## 2013-12-07 DIAGNOSIS — C7952 Secondary malignant neoplasm of bone marrow: Secondary | ICD-10-CM

## 2013-12-07 DIAGNOSIS — M899 Disorder of bone, unspecified: Secondary | ICD-10-CM | POA: Diagnosis not present

## 2013-12-07 LAB — COMPREHENSIVE METABOLIC PANEL (CC13)
ALT: 13 U/L (ref 0–55)
ANION GAP: 8 meq/L (ref 3–11)
AST: 24 U/L (ref 5–34)
Albumin: 3.6 g/dL (ref 3.5–5.0)
Alkaline Phosphatase: 104 U/L (ref 40–150)
BUN: 17.8 mg/dL (ref 7.0–26.0)
CALCIUM: 9.8 mg/dL (ref 8.4–10.4)
CHLORIDE: 105 meq/L (ref 98–109)
CO2: 27 meq/L (ref 22–29)
Creatinine: 0.8 mg/dL (ref 0.7–1.3)
Glucose: 94 mg/dl (ref 70–140)
Potassium: 4 mEq/L (ref 3.5–5.1)
Sodium: 140 mEq/L (ref 136–145)
Total Bilirubin: 0.71 mg/dL (ref 0.20–1.20)
Total Protein: 6.6 g/dL (ref 6.4–8.3)

## 2013-12-07 LAB — CBC WITH DIFFERENTIAL/PLATELET
BASO%: 1.5 % (ref 0.0–2.0)
Basophils Absolute: 0.1 10*3/uL (ref 0.0–0.1)
EOS ABS: 0.2 10*3/uL (ref 0.0–0.5)
EOS%: 2 % (ref 0.0–7.0)
HCT: 34.9 % — ABNORMAL LOW (ref 38.4–49.9)
HGB: 11.2 g/dL — ABNORMAL LOW (ref 13.0–17.1)
LYMPH#: 1.7 10*3/uL (ref 0.9–3.3)
LYMPH%: 19.4 % (ref 14.0–49.0)
MCH: 29.2 pg (ref 27.2–33.4)
MCHC: 32.1 g/dL (ref 32.0–36.0)
MCV: 91.1 fL (ref 79.3–98.0)
MONO#: 1 10*3/uL — AB (ref 0.1–0.9)
MONO%: 11.1 % (ref 0.0–14.0)
NEUT%: 66 % (ref 39.0–75.0)
NEUTROS ABS: 5.7 10*3/uL (ref 1.5–6.5)
Platelets: 295 10*3/uL (ref 140–400)
RBC: 3.83 10*6/uL — ABNORMAL LOW (ref 4.20–5.82)
RDW: 17.8 % — ABNORMAL HIGH (ref 11.0–14.6)
WBC: 8.7 10*3/uL (ref 4.0–10.3)

## 2013-12-07 MED ORDER — SODIUM CHLORIDE 0.9 % IV SOLN
Freq: Once | INTRAVENOUS | Status: AC
  Administered 2013-12-07: 12:00:00 via INTRAVENOUS

## 2013-12-07 MED ORDER — ONDANSETRON 8 MG/NS 50 ML IVPB
INTRAVENOUS | Status: AC
  Filled 2013-12-07: qty 8

## 2013-12-07 MED ORDER — DEXAMETHASONE SODIUM PHOSPHATE 10 MG/ML IJ SOLN
10.0000 mg | Freq: Once | INTRAMUSCULAR | Status: AC
  Administered 2013-12-07: 10 mg via INTRAVENOUS

## 2013-12-07 MED ORDER — DEXAMETHASONE SODIUM PHOSPHATE 10 MG/ML IJ SOLN
INTRAMUSCULAR | Status: AC
  Filled 2013-12-07: qty 1

## 2013-12-07 MED ORDER — DOCETAXEL CHEMO INJECTION 160 MG/16ML
75.0000 mg/m2 | Freq: Once | INTRAVENOUS | Status: AC
  Administered 2013-12-07: 130 mg via INTRAVENOUS
  Filled 2013-12-07: qty 13

## 2013-12-07 MED ORDER — ONDANSETRON 8 MG/50ML IVPB (CHCC)
8.0000 mg | Freq: Once | INTRAVENOUS | Status: AC
  Administered 2013-12-07: 8 mg via INTRAVENOUS

## 2013-12-07 NOTE — Telephone Encounter (Signed)
gv adn printed appt sched adn avs for pt for Aug thru [place]...sed added tx

## 2013-12-07 NOTE — Progress Notes (Signed)
Hematology and Oncology Follow Up Visit  [name] [name] [id number] Jul 11, 1940 73 y.o. 12/07/2013 11:59 AM   Principle Diagnosis: 73 year old gentleman with castration resistant metastatic prostate cancer with disease to the bone. His initial diagnosis was in 2012 where he presented with metastatic disease to the rib. His PSA was 187.   Prior Therapy:  He was found to have a mass in the right axilla near the fourth rib encasing the right with some sclerosis. He underwent a right VATS procedure and resection of the third, fourth and fifth rib and reconstruction with titanium plates under the care of Dr. Arlyce Dice on November 13 2010.  He went on to receive androgen deprivation therapy under the care of Dr. Gaynelle Arabian. His PSA nadir was to 0.1 back in May of 2013 and subsequently started to rise.  He was treated with Provenge in the fall of 2014.  After his PSA went up to 178.6 and Xtandi was started in February of 2015 and discontinued in 09/2013 due to progression of disease and poor tolerance.  Current therapy: Taxotere chemotherapy started on 10/05/2013. He is here for cycle 4.  Interim History:  [name] [name] presents today for a followup visit with his wife. Since his last visit, he tolerated the last cycle of chemotherapy without any complications. He did not report any nausea or vomiting. Did not report any peripheral neuropathy. Did not report any infusion-related complications. He did have mild headaches and bone pain associated with Neulasta but otherwise no other complications. His quality of life has slightly improved and his pain has also improved as well. Patient has improved significantly and although requires any narcotic analgesics. He is taking Tylenol 4 times total since the start of chemotherapy. His mobility and performance status remains the same at this time. He did not report any headaches or blurry vision or double vision. Has not reported any syncope or change in his mental status. He  does have memory issues and is currently under evaluation for that. He is not reporting any chest pain or shortness of breath. Is not reporting any cough or hemoptysis. Is that report any palpitation or leg edema. Does not report any frequency urgency or hesitancy. Does not report any skeletal complications. Does not report any lymphadenopathy or petechiae. Rest of his review of systems unremarkable.  Medications: I have reviewed the patient's current medications.  Current Outpatient Prescriptions  Medication Sig Dispense Refill  . acetaminophen (TYLENOL) 325 MG tablet Take 650 mg by mouth every 6 (six) hours as needed.      Marland Kitchen aspirin EC 81 MG tablet Take 81 mg by mouth at bedtime.      . Calcium Citrate-Vitamin D (CITRACAL + D PO) Take 3 tablets by mouth every morning.      . mirtazapine (REMERON) 15 MG tablet Take 15 mg by mouth at bedtime.      . simvastatin (ZOCOR) 80 MG tablet Take 80 mg by mouth at bedtime.        . prochlorperazine (COMPAZINE) 10 MG tablet Take 1 tablet (10 mg total) by mouth every 6 (six) hours as needed for nausea or vomiting.  30 tablet  0   No current facility-administered medications for this visit.     Allergies:  Allergies  Allergen Reactions  . Oxycodone Nausea And Vomiting  . Penicillins Itching and Rash    Past Medical History, Surgical history, Social history, and Family History were reviewed and updated.   Physical Exam: Blood pressure 112/68, pulse 91, temperature  98.7 F (37.1 C), temperature source Oral, resp. rate 18, height 5\' 7"  (1.702 m), weight 141 lb 3.2 oz (64.048 kg), SpO2 98.00%. ECOG:  1 General appearance: alert and cooperative Head: Normocephalic, without obvious abnormality Neck: no adenopathy Lymph nodes: Cervical, supraclavicular, and axillary nodes normal. Heart:regular rate and rhythm, S1, S2 normal, no murmur, click, rub or gallop Lung:chest clear, no wheezing, rales, normal symmetric air entry Abdomin: soft, non-tender,  without masses or organomegaly EXT:no erythema, induration, or nodules   Lab Results: Lab Results  Component Value Date   WBC 8.7 12/07/2013   HGB 11.2* 12/07/2013   HCT 34.9* 12/07/2013   MCV 91.1 12/07/2013   PLT 295 12/07/2013     Chemistry      Component Value Date/Time   NA 137 11/16/2013 1144   NA 136 02/06/2013 1157   K 4.0 11/16/2013 1144   K 3.9 02/06/2013 1157   CL 98 02/06/2013 1157   CO2 27 11/16/2013 1144   CO2 28 02/06/2013 1157   BUN 17.8 11/16/2013 1144   BUN 15 02/06/2013 1157   CREATININE 0.7 11/16/2013 1144   CREATININE 0.87 02/06/2013 1157      Component Value Date/Time   CALCIUM 9.4 11/16/2013 1144   CALCIUM 10.2 02/06/2013 1157   ALKPHOS 116 11/16/2013 1144   ALKPHOS 82 11/15/2010 0300   AST 22 11/16/2013 1144   AST 31 11/15/2010 0300   ALT 12 11/16/2013 1144   ALT 16 11/15/2010 0300   BILITOT 0.68 11/16/2013 1144   BILITOT 0.5 11/15/2010 0300      Results for [name] [name], [name] [name] (MRN [id number]) as of 12/07/2013 10:57  Ref. Range 09/19/2013 13:40 10/26/2013 10:56 11/16/2013 11:44  PSA Latest Range: <=4.00 ng/mL 292.90 (H) 10.13 (H) 2.00     Impression and Plan:  73 year old gentleman with the following issues:  1. 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 He is currently receiving Taxotere chemotherapy and ready to proceed with  Cycle 4. He tolerated chemotherapy well so far with excellent response in his PSA. His PSA down to 2.0 from 292. He has very little side effects at this point and I plan to continue with the current regimen. I will have 2 more cycles of chemotherapy and I will assess him at that time.  2.Bone pain: Improved with systemic chemotherapy.  3. Bone directed therapy: We hold off on any bone directed therapy for the time being.  4. Nausea and vomiting: Likely related to Xtandi medication and seems to be improving after stopping this current medication. He is currently prescribed antiemetics to  use.  5. IV access: I continue to discuss with him the role of Port-A-Cath and he would like to continue with peripheral veins for the time being. If his veins become an issue we will proceed with a Port-A-Cath insertion.  6. Neutropenia prophylaxis: He will continue Neulasta after each chemotherapy cycle  7. Followup: Will be in 3 weeks for the next chemotherapy cycle.       [facility], MD 8/20/201511:59 AM

## 2013-12-07 NOTE — Patient Instructions (Signed)
[facility] Discharge Instructions for Patients Receiving Chemotherapy  Today you received the following chemotherapy agents; Taxotere.   To help prevent nausea and vomiting after your treatment, we encourage you to take your nausea medication as directed.    If you develop nausea and vomiting that is not controlled by your nausea medication, call the clinic.   BELOW ARE SYMPTOMS THAT SHOULD BE REPORTED IMMEDIATELY:  *FEVER GREATER THAN 100.5 F  *CHILLS WITH OR WITHOUT FEVER  NAUSEA AND VOMITING THAT IS NOT CONTROLLED WITH YOUR NAUSEA MEDICATION  *UNUSUAL SHORTNESS OF BREATH  *UNUSUAL BRUISING OR BLEEDING  TENDERNESS IN MOUTH AND THROAT WITH OR WITHOUT PRESENCE OF ULCERS  *URINARY PROBLEMS  *BOWEL PROBLEMS  UNUSUAL RASH Items with * indicate a potential emergency and should be followed up as soon as possible.  Feel free to call the clinic you have any questions or concerns. The clinic phone number is [phone].    

## 2013-12-08 ENCOUNTER — Ambulatory Visit (HOSPITAL_BASED_OUTPATIENT_CLINIC_OR_DEPARTMENT_OTHER): Payer: Medicare Other

## 2013-12-08 VITALS — BP 120/70 | HR 89 | Temp 97.8°F

## 2013-12-08 DIAGNOSIS — Z5189 Encounter for other specified aftercare: Secondary | ICD-10-CM

## 2013-12-08 DIAGNOSIS — C61 Malignant neoplasm of prostate: Secondary | ICD-10-CM | POA: Diagnosis not present

## 2013-12-08 DIAGNOSIS — C7951 Secondary malignant neoplasm of bone: Secondary | ICD-10-CM | POA: Diagnosis not present

## 2013-12-08 DIAGNOSIS — C7952 Secondary malignant neoplasm of bone marrow: Secondary | ICD-10-CM

## 2013-12-08 LAB — PSA: PSA: 1.55 ng/mL (ref <=4.00)

## 2013-12-08 MED ORDER — PEGFILGRASTIM INJECTION 6 MG/0.6ML
6.0000 mg | Freq: Once | SUBCUTANEOUS | Status: AC
  Administered 2013-12-08: 6 mg via SUBCUTANEOUS
  Filled 2013-12-08: qty 0.6

## 2013-12-28 ENCOUNTER — Other Ambulatory Visit (HOSPITAL_BASED_OUTPATIENT_CLINIC_OR_DEPARTMENT_OTHER): Payer: Medicare Other

## 2013-12-28 ENCOUNTER — Ambulatory Visit (HOSPITAL_BASED_OUTPATIENT_CLINIC_OR_DEPARTMENT_OTHER): Payer: Medicare Other | Admitting: Physician Assistant

## 2013-12-28 ENCOUNTER — Telehealth: Payer: Self-pay | Admitting: Oncology

## 2013-12-28 ENCOUNTER — Encounter: Payer: Self-pay | Admitting: Physician Assistant

## 2013-12-28 ENCOUNTER — Ambulatory Visit (HOSPITAL_BASED_OUTPATIENT_CLINIC_OR_DEPARTMENT_OTHER): Payer: Medicare Other

## 2013-12-28 VITALS — BP 115/64 | HR 87 | Temp 98.0°F | Resp 18 | Ht 67.0 in | Wt 143.3 lb

## 2013-12-28 DIAGNOSIS — C61 Malignant neoplasm of prostate: Secondary | ICD-10-CM

## 2013-12-28 DIAGNOSIS — C7952 Secondary malignant neoplasm of bone marrow: Secondary | ICD-10-CM

## 2013-12-28 DIAGNOSIS — C7951 Secondary malignant neoplasm of bone: Secondary | ICD-10-CM

## 2013-12-28 DIAGNOSIS — Z5111 Encounter for antineoplastic chemotherapy: Secondary | ICD-10-CM | POA: Diagnosis not present

## 2013-12-28 LAB — COMPREHENSIVE METABOLIC PANEL (CC13)
ALBUMIN: 3.3 g/dL — AB (ref 3.5–5.0)
ALK PHOS: 103 U/L (ref 40–150)
ALT: 12 U/L (ref 0–55)
AST: 24 U/L (ref 5–34)
Anion Gap: 7 mEq/L (ref 3–11)
BUN: 12.7 mg/dL (ref 7.0–26.0)
CO2: 26 mEq/L (ref 22–29)
Calcium: 9.1 mg/dL (ref 8.4–10.4)
Chloride: 106 mEq/L (ref 98–109)
Creatinine: 0.8 mg/dL (ref 0.7–1.3)
Glucose: 95 mg/dl (ref 70–140)
Potassium: 4.1 mEq/L (ref 3.5–5.1)
SODIUM: 139 meq/L (ref 136–145)
TOTAL PROTEIN: 6.2 g/dL — AB (ref 6.4–8.3)
Total Bilirubin: 0.45 mg/dL (ref 0.20–1.20)

## 2013-12-28 LAB — CBC WITH DIFFERENTIAL/PLATELET
BASO%: 1 % (ref 0.0–2.0)
Basophils Absolute: 0.1 10*3/uL (ref 0.0–0.1)
EOS ABS: 0.2 10*3/uL (ref 0.0–0.5)
EOS%: 2.6 % (ref 0.0–7.0)
HCT: 33.8 % — ABNORMAL LOW (ref 38.4–49.9)
HGB: 11 g/dL — ABNORMAL LOW (ref 13.0–17.1)
LYMPH%: 16.1 % (ref 14.0–49.0)
MCH: 30.3 pg (ref 27.2–33.4)
MCHC: 32.5 g/dL (ref 32.0–36.0)
MCV: 93.1 fL (ref 79.3–98.0)
MONO#: 1.1 10*3/uL — ABNORMAL HIGH (ref 0.1–0.9)
MONO%: 13.5 % (ref 0.0–14.0)
NEUT%: 66.8 % (ref 39.0–75.0)
NEUTROS ABS: 5.4 10*3/uL (ref 1.5–6.5)
Platelets: 301 10*3/uL (ref 140–400)
RBC: 3.63 10*6/uL — ABNORMAL LOW (ref 4.20–5.82)
RDW: 17.3 % — AB (ref 11.0–14.6)
WBC: 8.1 10*3/uL (ref 4.0–10.3)
lymph#: 1.3 10*3/uL (ref 0.9–3.3)

## 2013-12-28 MED ORDER — SODIUM CHLORIDE 0.9 % IV SOLN
Freq: Once | INTRAVENOUS | Status: AC
  Administered 2013-12-28: 13:00:00 via INTRAVENOUS

## 2013-12-28 MED ORDER — DOCETAXEL CHEMO INJECTION 160 MG/16ML
75.0000 mg/m2 | Freq: Once | INTRAVENOUS | Status: AC
  Administered 2013-12-28: 130 mg via INTRAVENOUS
  Filled 2013-12-28: qty 13

## 2013-12-28 MED ORDER — DEXAMETHASONE SODIUM PHOSPHATE 10 MG/ML IJ SOLN
10.0000 mg | Freq: Once | INTRAMUSCULAR | Status: AC
  Administered 2013-12-28: 10 mg via INTRAVENOUS

## 2013-12-28 MED ORDER — ONDANSETRON 8 MG/NS 50 ML IVPB
INTRAVENOUS | Status: AC
  Filled 2013-12-28: qty 8

## 2013-12-28 MED ORDER — ONDANSETRON 8 MG/50ML IVPB (CHCC)
8.0000 mg | Freq: Once | INTRAVENOUS | Status: AC
  Administered 2013-12-28: 8 mg via INTRAVENOUS

## 2013-12-28 MED ORDER — DEXAMETHASONE SODIUM PHOSPHATE 10 MG/ML IJ SOLN
INTRAMUSCULAR | Status: AC
  Filled 2013-12-28: qty 1

## 2013-12-28 NOTE — Telephone Encounter (Signed)
gv adn printed appt sched and avs for pt for Sept adn OCT.Marland KitchenMarland KitchenMarland Kitchen

## 2013-12-28 NOTE — Progress Notes (Signed)
Hematology and Oncology Follow Up Visit  [name] [name] [id number] 26-Feb-1941 73 y.o. 12/28/2013 5:08 PM   Principle Diagnosis: 73 year old gentleman with castration resistant metastatic prostate cancer with disease to the bone. His initial diagnosis was in 2012 where he presented with metastatic disease to the rib. His PSA was 187.   Prior Therapy:  He was found to have a mass in the right axilla near the fourth rib encasing the right with some sclerosis. He underwent a right VATS procedure and resection of the third, fourth and fifth rib and reconstruction with titanium plates under the care of Dr. Arlyce Dice on November 13 2010.  He went on to receive androgen deprivation therapy under the care of Dr. Gaynelle Arabian. His PSA nadir was to 0.1 back in May of 2013 and subsequently started to rise.  He was treated with Provenge in the fall of 2014.  After his PSA went up to 178.6 and Xtandi was started in February of 2015 and discontinued in 09/2013 due to progression of disease and poor tolerance.  Current therapy: Taxotere chemotherapy started on 10/05/2013. He is here for cycle 5.  Interim History:  [name] [name] presents today for a followup visit unaccompanied.  Since his last visit, he tolerated the last cycle of chemotherapy without any complications. He did not report any nausea or vomiting. Did not report any peripheral neuropathy. Did not report any infusion-related complications. He does report some bone pain associated with Neulasta but otherwise no other complications. His appetite has improved His quality of life has slightly improved and his pain has also improved as well. His mobility and performance status remains the same at this time. He does not report any headaches or blurry vision or double vision. Has not reported any syncope or change in his mental status. He does have memory issues and is currently under evaluation for that. He is not reporting any chest pain or shortness of breath. Is not  reporting any cough or hemoptysis. Is that report any palpitation or leg edema. Does not report any frequency urgency or hesitancy. Does not report any skeletal complications. Does not report any lymphadenopathy or petechiae. Rest of his review of systems unremarkable.  Medications: I have reviewed the patient's current medications.  Current Outpatient Prescriptions  Medication Sig Dispense Refill  . aspirin EC 81 MG tablet Take 81 mg by mouth at bedtime.      . Calcium Citrate-Vitamin D (CITRACAL + D PO) Take 3 tablets by mouth every morning.      . mirtazapine (REMERON) 15 MG tablet Take 15 mg by mouth at bedtime.      . simvastatin (ZOCOR) 80 MG tablet Take 80 mg by mouth at bedtime.        Marland Kitchen acetaminophen (TYLENOL) 325 MG tablet Take 650 mg by mouth every 6 (six) hours as needed.      . prochlorperazine (COMPAZINE) 10 MG tablet Take 1 tablet (10 mg total) by mouth every 6 (six) hours as needed for nausea or vomiting.  30 tablet  0   No current facility-administered medications for this visit.     Allergies:  Allergies  Allergen Reactions  . Oxycodone Nausea And Vomiting  . Penicillins Itching and Rash    Past Medical History, Surgical history, Social history, and Family History were reviewed and updated.   Physical Exam: Blood pressure 115/64, pulse 87, temperature 98 F (36.7 C), temperature source Oral, resp. rate 18, height 5\' 7"  (1.702 m), weight 143 lb 4.8 oz (65  kg), SpO2 98.00%. ECOG:  1 General appearance: alert and cooperative Head: Normocephalic, without obvious abnormality Neck: no adenopathy Lymph nodes: Cervical, supraclavicular, and axillary nodes normal. Heart:regular rate and rhythm, S1, S2 normal, no murmur, click, rub or gallop Lung:chest clear, no wheezing, rales, normal symmetric air entry Abdomin: soft, non-tender, without masses or organomegaly EXT:no erythema, induration, or nodules   Lab Results: Lab Results  Component Value Date   WBC 8.1  12/28/2013   HGB 11.0* 12/28/2013   HCT 33.8* 12/28/2013   MCV 93.1 12/28/2013   PLT 301 12/28/2013     Chemistry      Component Value Date/Time   NA 139 12/28/2013 1138   NA 136 02/06/2013 1157   K 4.1 12/28/2013 1138   K 3.9 02/06/2013 1157   CL 98 02/06/2013 1157   CO2 26 12/28/2013 1138   CO2 28 02/06/2013 1157   BUN 12.7 12/28/2013 1138   BUN 15 02/06/2013 1157   CREATININE 0.8 12/28/2013 1138   CREATININE 0.87 02/06/2013 1157      Component Value Date/Time   CALCIUM 9.1 12/28/2013 1138   CALCIUM 10.2 02/06/2013 1157   ALKPHOS 103 12/28/2013 1138   ALKPHOS 82 11/15/2010 0300   AST 24 12/28/2013 1138   AST 31 11/15/2010 0300   ALT 12 12/28/2013 1138   ALT 16 11/15/2010 0300   BILITOT 0.45 12/28/2013 1138   BILITOT 0.5 11/15/2010 0300      Results for [name] [name] (MRN [id number]) as of 12/07/2013 10:57  Ref. Range 09/19/2013 13:40 10/26/2013 10:56 11/16/2013 11:44  PSA Latest Range: <=4.00 ng/mL 292.90 (H) 10.13 (H) 2.00     Impression and Plan:  73 year old gentleman with the following issues:  1. 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 He is currently receiving Taxotere chemotherapy and ready to proceed with  Cycle 5. He tolerated chemotherapy well so far with excellent response in his PSA. His PSA down to 2.0 from 292. His most recent PSA from 12/07/2013 was 1.55. Overall is tolerating this course of chemotherapy without difficulty.  Likely will need reevaluation after completion of cycle #6.  2.Bone pain: Improved with systemic chemotherapy.  3. Bone directed therapy: We hold off on any bone directed therapy for the time being.  4. Nausea and vomiting: Likely related to Xtandi medication and seems to be improving after stopping this current medication. He is currently prescribed antiemetics to use.  5. IV access: continue to discuss with him the role of Port-A-Cath and he would like to continue with peripheral veins for the time  being. If his veins become an issue we will proceed with a Port-A-Cath insertion.  6. Neutropenia prophylaxis: He will continue Neulasta after each chemotherapy cycle  7. Followup: Will be in 3 weeks for the next chemotherapy cycle.   Awilda Metro E, PA-C  9/10/20155:08 PM

## 2013-12-28 NOTE — Patient Instructions (Signed)
[facility] Discharge Instructions for Patients Receiving Chemotherapy  Today you received the following chemotherapy agents: Taxotere  To help prevent nausea and vomiting after your treatment, we encourage you to take your nausea medication as prescribed.    If you develop nausea and vomiting that is not controlled by your nausea medication, call the clinic.   BELOW ARE SYMPTOMS THAT SHOULD BE REPORTED IMMEDIATELY:  *FEVER GREATER THAN 100.5 F  *CHILLS WITH OR WITHOUT FEVER  NAUSEA AND VOMITING THAT IS NOT CONTROLLED WITH YOUR NAUSEA MEDICATION  *UNUSUAL SHORTNESS OF BREATH  *UNUSUAL BRUISING OR BLEEDING  TENDERNESS IN MOUTH AND THROAT WITH OR WITHOUT PRESENCE OF ULCERS  *URINARY PROBLEMS  *BOWEL PROBLEMS  UNUSUAL RASH Items with * indicate a potential emergency and should be followed up as soon as possible.  Feel free to call the clinic you have any questions or concerns. The clinic phone number is [phone].    

## 2013-12-29 ENCOUNTER — Ambulatory Visit (HOSPITAL_BASED_OUTPATIENT_CLINIC_OR_DEPARTMENT_OTHER): Payer: Medicare Other

## 2013-12-29 VITALS — BP 118/70 | HR 75 | Temp 97.8°F

## 2013-12-29 DIAGNOSIS — Z5189 Encounter for other specified aftercare: Secondary | ICD-10-CM

## 2013-12-29 DIAGNOSIS — C7951 Secondary malignant neoplasm of bone: Secondary | ICD-10-CM

## 2013-12-29 DIAGNOSIS — C61 Malignant neoplasm of prostate: Secondary | ICD-10-CM | POA: Diagnosis not present

## 2013-12-29 DIAGNOSIS — C7952 Secondary malignant neoplasm of bone marrow: Secondary | ICD-10-CM | POA: Diagnosis not present

## 2013-12-29 LAB — PSA: PSA: 1.22 ng/mL (ref <=4.00)

## 2013-12-29 MED ORDER — PEGFILGRASTIM INJECTION 6 MG/0.6ML
6.0000 mg | Freq: Once | SUBCUTANEOUS | Status: AC
  Administered 2013-12-29: 6 mg via SUBCUTANEOUS
  Filled 2013-12-29: qty 0.6

## 2013-12-29 NOTE — Patient Instructions (Signed)
Follow up in 3 weeks prior to your next scheduled cycle of chemotherapy 

## 2014-01-01 ENCOUNTER — Telehealth: Payer: Self-pay | Admitting: *Deleted

## 2014-01-01 NOTE — Telephone Encounter (Signed)
Wife ellen calling to report patient is coughing more than usual. He has coughed for years d/t emphysema, but now, it is   Productive, with clear sputum. He had a 100 degree temp last night. Today is afebrile at 97 degrees. Instructed her to use otc cough medicine, tylenol for fever. If fever reaches 100.5 she is to call this office.

## 2014-01-10 DIAGNOSIS — Z23 Encounter for immunization: Secondary | ICD-10-CM | POA: Diagnosis not present

## 2014-01-10 DIAGNOSIS — C61 Malignant neoplasm of prostate: Secondary | ICD-10-CM | POA: Diagnosis not present

## 2014-01-10 DIAGNOSIS — I959 Hypotension, unspecified: Secondary | ICD-10-CM | POA: Diagnosis not present

## 2014-01-10 DIAGNOSIS — R413 Other amnesia: Secondary | ICD-10-CM | POA: Diagnosis not present

## 2014-01-10 DIAGNOSIS — R634 Abnormal weight loss: Secondary | ICD-10-CM | POA: Diagnosis not present

## 2014-01-10 DIAGNOSIS — R000 Tachycardia, unspecified: Secondary | ICD-10-CM | POA: Diagnosis not present

## 2014-01-18 ENCOUNTER — Encounter: Payer: Self-pay | Admitting: Oncology

## 2014-01-18 ENCOUNTER — Telehealth: Payer: Self-pay | Admitting: Oncology

## 2014-01-18 ENCOUNTER — Other Ambulatory Visit (HOSPITAL_BASED_OUTPATIENT_CLINIC_OR_DEPARTMENT_OTHER): Payer: Medicare Other

## 2014-01-18 ENCOUNTER — Ambulatory Visit (HOSPITAL_BASED_OUTPATIENT_CLINIC_OR_DEPARTMENT_OTHER): Payer: Medicare Other | Admitting: Oncology

## 2014-01-18 ENCOUNTER — Telehealth: Payer: Self-pay | Admitting: *Deleted

## 2014-01-18 ENCOUNTER — Ambulatory Visit (HOSPITAL_BASED_OUTPATIENT_CLINIC_OR_DEPARTMENT_OTHER): Payer: Medicare Other

## 2014-01-18 VITALS — BP 117/66 | HR 94 | Temp 97.3°F | Resp 20 | Ht 67.0 in | Wt 143.9 lb

## 2014-01-18 DIAGNOSIS — Z5111 Encounter for antineoplastic chemotherapy: Secondary | ICD-10-CM | POA: Diagnosis not present

## 2014-01-18 DIAGNOSIS — C7951 Secondary malignant neoplasm of bone: Secondary | ICD-10-CM | POA: Diagnosis not present

## 2014-01-18 DIAGNOSIS — C61 Malignant neoplasm of prostate: Secondary | ICD-10-CM

## 2014-01-18 DIAGNOSIS — R112 Nausea with vomiting, unspecified: Secondary | ICD-10-CM | POA: Diagnosis not present

## 2014-01-18 LAB — CBC WITH DIFFERENTIAL/PLATELET
BASO%: 1.4 % (ref 0.0–2.0)
BASOS ABS: 0.1 10*3/uL (ref 0.0–0.1)
EOS%: 1.8 % (ref 0.0–7.0)
Eosinophils Absolute: 0.2 10*3/uL (ref 0.0–0.5)
HEMATOCRIT: 34.9 % — AB (ref 38.4–49.9)
HGB: 11 g/dL — ABNORMAL LOW (ref 13.0–17.1)
LYMPH%: 20.3 % (ref 14.0–49.0)
MCH: 30 pg (ref 27.2–33.4)
MCHC: 31.5 g/dL — AB (ref 32.0–36.0)
MCV: 95.1 fL (ref 79.3–98.0)
MONO#: 1.1 10*3/uL — ABNORMAL HIGH (ref 0.1–0.9)
MONO%: 12.6 % (ref 0.0–14.0)
NEUT#: 5.4 10*3/uL (ref 1.5–6.5)
NEUT%: 63.9 % (ref 39.0–75.0)
PLATELETS: 311 10*3/uL (ref 140–400)
RBC: 3.67 10*6/uL — ABNORMAL LOW (ref 4.20–5.82)
RDW: 16.3 % — ABNORMAL HIGH (ref 11.0–14.6)
WBC: 8.5 10*3/uL (ref 4.0–10.3)
lymph#: 1.7 10*3/uL (ref 0.9–3.3)
nRBC: 0 % (ref 0–0)

## 2014-01-18 LAB — COMPREHENSIVE METABOLIC PANEL (CC13)
ALT: 15 U/L (ref 0–55)
AST: 27 U/L (ref 5–34)
Albumin: 3.2 g/dL — ABNORMAL LOW (ref 3.5–5.0)
Alkaline Phosphatase: 93 U/L (ref 40–150)
Anion Gap: 5 mEq/L (ref 3–11)
BUN: 12.9 mg/dL (ref 7.0–26.0)
CALCIUM: 9.5 mg/dL (ref 8.4–10.4)
CO2: 31 mEq/L — ABNORMAL HIGH (ref 22–29)
CREATININE: 0.8 mg/dL (ref 0.7–1.3)
Chloride: 105 mEq/L (ref 98–109)
GLUCOSE: 88 mg/dL (ref 70–140)
Potassium: 4.2 mEq/L (ref 3.5–5.1)
Sodium: 141 mEq/L (ref 136–145)
Total Bilirubin: 0.5 mg/dL (ref 0.20–1.20)
Total Protein: 6.3 g/dL — ABNORMAL LOW (ref 6.4–8.3)

## 2014-01-18 MED ORDER — DEXTROSE 5 % IV SOLN
75.0000 mg/m2 | Freq: Once | INTRAVENOUS | Status: AC
  Administered 2014-01-18: 130 mg via INTRAVENOUS
  Filled 2014-01-18: qty 13

## 2014-01-18 MED ORDER — ONDANSETRON 8 MG/50ML IVPB (CHCC)
8.0000 mg | Freq: Once | INTRAVENOUS | Status: AC
  Administered 2014-01-18: 8 mg via INTRAVENOUS

## 2014-01-18 MED ORDER — SODIUM CHLORIDE 0.9 % IV SOLN
Freq: Once | INTRAVENOUS | Status: AC
  Administered 2014-01-18: 10:00:00 via INTRAVENOUS

## 2014-01-18 MED ORDER — DEXAMETHASONE SODIUM PHOSPHATE 10 MG/ML IJ SOLN
INTRAMUSCULAR | Status: AC
  Filled 2014-01-18: qty 1

## 2014-01-18 MED ORDER — DEXAMETHASONE SODIUM PHOSPHATE 10 MG/ML IJ SOLN
10.0000 mg | Freq: Once | INTRAMUSCULAR | Status: AC
  Administered 2014-01-18: 10 mg via INTRAVENOUS

## 2014-01-18 MED ORDER — ONDANSETRON 8 MG/NS 50 ML IVPB
INTRAVENOUS | Status: AC
  Filled 2014-01-18: qty 8

## 2014-01-18 NOTE — Progress Notes (Signed)
Hematology and Oncology Follow Up Visit  [name] [name] [id number] Mar 25, 1941 73 y.o. 01/18/2014 8:57 AM   Principle Diagnosis: 73 year old gentleman with castration resistant metastatic prostate cancer with disease to the bone. His initial diagnosis was in 2012 where he presented with metastatic disease to the rib. His PSA was 187.   Prior Therapy:  He was found to have a mass in the right axilla near the fourth rib encasing the right with some sclerosis. He underwent a right VATS procedure and resection of the third, fourth and fifth rib and reconstruction with titanium plates under the care of Dr. Arlyce [name] on November 13 2010.  He went on to receive androgen deprivation therapy under the care of Dr. Gaynelle [name]. His PSA nadir was to 0.1 back in May of 2013 and subsequently started to rise.  He was treated with Provenge in the fall of 2014.  After his PSA went up to 178.6 and Xtandi was started in February of 2015 and discontinued in 09/2013 due to progression of disease and poor tolerance.  Current therapy: Taxotere chemotherapy started on 10/05/2013. He is here for cycle 6.  Interim History:  [name] [name] presents today for a followup visit.  Since his last visit, he tolerated the last cycle of chemotherapy without any complications. He continues to have excellent clinical benefit from systemic chemotherapy. He did not report any nausea or vomiting. Did not report any peripheral neuropathy. Did not report any infusion-related complications. He does report some bone pain associated with Neulasta but otherwise no other complications. His appetite has improved His quality of life has slightly improved and his pain has also improved as well. His mobility and performance status remains the same at this time. He does not report any headaches or blurry vision or double vision. Has not reported any syncope or change in his mental status. He does have memory issues and is currently under evaluation for that. He  is not reporting any chest pain or shortness of breath. Is not reporting any cough or hemoptysis. Is that report any palpitation or leg edema. Does not report any frequency urgency or hesitancy. Does not report any skeletal complications. Does not report any lymphadenopathy or petechiae. Rest of his review of systems unremarkable.  Medications: I have reviewed the patient's current medications.  Current Outpatient Prescriptions  Medication Sig Dispense Refill  . acetaminophen (TYLENOL) 325 MG tablet Take 650 mg by mouth every 6 (six) hours as needed.      Marland Kitchen aspirin EC 81 MG tablet Take 81 mg by mouth at bedtime.      . Calcium Citrate-Vitamin D (CITRACAL + D PO) Take 3 tablets by mouth every morning.      . mirtazapine (REMERON) 15 MG tablet Take 15 mg by mouth at bedtime.      . prochlorperazine (COMPAZINE) 10 MG tablet Take 1 tablet (10 mg total) by mouth every 6 (six) hours as needed for nausea or vomiting.  30 tablet  0  . simvastatin (ZOCOR) 80 MG tablet Take 80 mg by mouth at bedtime.         No current facility-administered medications for this visit.     Allergies:  Allergies  Allergen Reactions  . Oxycodone Nausea And Vomiting  . Penicillins Itching and Rash    Past Medical History, Surgical history, Social history, and Family History were reviewed and updated.   Physical Exam: Blood pressure 117/66, pulse 94, temperature 97.3 F (36.3 C), temperature source Oral, resp. rate 20, height 5'  7" (1.702 m), weight 143 lb 14.4 oz (65.273 kg). ECOG:  1 General appearance: alert and cooperative Head: Normocephalic, without obvious abnormality Neck: no adenopathy Lymph nodes: Cervical, supraclavicular, and axillary nodes normal. Heart:regular rate and rhythm, S1, S2 normal, no murmur, click, rub or gallop Lung:chest clear, no wheezing, rales, normal symmetric air entry Abdomin: soft, non-tender, without masses or organomegaly EXT:no erythema, induration, or nodules   Lab  Results: Lab Results  Component Value Date   WBC 8.1 12/28/2013   HGB 11.0* 12/28/2013   HCT 33.8* 12/28/2013   MCV 93.1 12/28/2013   PLT 301 12/28/2013     Chemistry      Component Value Date/Time   NA 139 12/28/2013 1138   NA 136 02/06/2013 1157   K 4.1 12/28/2013 1138   K 3.9 02/06/2013 1157   CL 98 02/06/2013 1157   CO2 26 12/28/2013 1138   CO2 28 02/06/2013 1157   BUN 12.7 12/28/2013 1138   BUN 15 02/06/2013 1157   CREATININE 0.8 12/28/2013 1138   CREATININE 0.87 02/06/2013 1157      Component Value Date/Time   CALCIUM 9.1 12/28/2013 1138   CALCIUM 10.2 02/06/2013 1157   ALKPHOS 103 12/28/2013 1138   ALKPHOS 82 11/15/2010 0300   AST 24 12/28/2013 1138   AST 31 11/15/2010 0300   ALT 12 12/28/2013 1138   ALT 16 11/15/2010 0300   BILITOT 0.45 12/28/2013 1138   BILITOT 0.5 11/15/2010 0300      Results for [name] [name] (MRN [id number]) as of 01/18/2014 08:43  Ref. Range 11/16/2013 11:44 12/07/2013 11:18 12/28/2013 11:38  PSA Latest Range: <=4.00 ng/mL 2.00 1.55 1.22      Impression and Plan:  73 year old gentleman with the following issues:  1. 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 He is currently receiving Taxotere chemotherapy and ready to proceed with  Cycle 6. He tolerated chemotherapy well so far with excellent response in his PSA. His PSA down to 1.22 from 292. The plan is to continue with the current dose and schedule given his excellent response and excellent clinical benefit.  2.Bone pain: Improved with systemic chemotherapy.  3. Bone directed therapy: We hold off on any bone directed therapy for the time being.  4. Nausea and vomiting: Likely related to Xtandi medication and seems to be improving after stopping this current medication. He is currently prescribed antiemetics to use and have used empirically infrequently.  5. IV access: continue to discuss with him the role of Port-A-Cath and he would like to continue with  peripheral veins for the time being. If his veins become an issue we will proceed with a Port-A-Cath insertion.  6. Neutropenia prophylaxis: He will continue Neulasta after each chemotherapy cycle  7. Followup: Will be in 3 weeks for the next chemotherapy cycle.   [email or web address],TSVXB, MD 10/1/20158:57 AM

## 2014-01-18 NOTE — Telephone Encounter (Signed)
Per staff message and POF I have scheduled appts. Advised scheduler of appts. JMW  

## 2014-01-18 NOTE — Patient Instructions (Signed)
[facility] Discharge Instructions for Patients Receiving Chemotherapy  Today you received the following chemotherapy agents Taxotere  To help prevent nausea and vomiting after your treatment, we encourage you to take your nausea medication   Compazine by mouth as directed   If you develop nausea and vomiting that is not controlled by your nausea medication, call the clinic.   BELOW ARE SYMPTOMS THAT SHOULD BE REPORTED IMMEDIATELY:  *FEVER GREATER THAN 100.5 F  *CHILLS WITH OR WITHOUT FEVER  NAUSEA AND VOMITING THAT IS NOT CONTROLLED WITH YOUR NAUSEA MEDICATION  *UNUSUAL SHORTNESS OF BREATH  *UNUSUAL BRUISING OR BLEEDING  TENDERNESS IN MOUTH AND THROAT WITH OR WITHOUT PRESENCE OF ULCERS  *URINARY PROBLEMS  *BOWEL PROBLEMS  UNUSUAL RASH Items with * indicate a potential emergency and should be followed up as soon as possible.  Feel free to call the clinic you have any questions or concerns. The clinic phone number is [phone].

## 2014-01-19 ENCOUNTER — Ambulatory Visit (HOSPITAL_BASED_OUTPATIENT_CLINIC_OR_DEPARTMENT_OTHER): Payer: Medicare Other

## 2014-01-19 VITALS — BP 129/79 | HR 96 | Temp 97.9°F

## 2014-01-19 DIAGNOSIS — C7951 Secondary malignant neoplasm of bone: Secondary | ICD-10-CM | POA: Diagnosis not present

## 2014-01-19 DIAGNOSIS — C61 Malignant neoplasm of prostate: Secondary | ICD-10-CM

## 2014-01-19 LAB — PSA: PSA: 1.2 ng/mL (ref <=4.00)

## 2014-01-19 MED ORDER — PEGFILGRASTIM INJECTION 6 MG/0.6ML
6.0000 mg | Freq: Once | SUBCUTANEOUS | Status: AC
  Administered 2014-01-19: 6 mg via SUBCUTANEOUS
  Filled 2014-01-19: qty 0.6

## 2014-02-02 DIAGNOSIS — I498 Other specified cardiac arrhythmias: Secondary | ICD-10-CM | POA: Diagnosis not present

## 2014-02-02 DIAGNOSIS — E861 Hypovolemia: Secondary | ICD-10-CM | POA: Diagnosis not present

## 2014-02-08 ENCOUNTER — Ambulatory Visit: Payer: Medicare Other | Admitting: Physician Assistant

## 2014-02-08 ENCOUNTER — Other Ambulatory Visit (HOSPITAL_BASED_OUTPATIENT_CLINIC_OR_DEPARTMENT_OTHER): Payer: Medicare Other

## 2014-02-08 ENCOUNTER — Ambulatory Visit (HOSPITAL_BASED_OUTPATIENT_CLINIC_OR_DEPARTMENT_OTHER): Payer: Medicare Other | Admitting: Physician Assistant

## 2014-02-08 ENCOUNTER — Encounter: Payer: Self-pay | Admitting: Physician Assistant

## 2014-02-08 ENCOUNTER — Other Ambulatory Visit: Payer: Medicare Other

## 2014-02-08 ENCOUNTER — Ambulatory Visit: Payer: Medicare Other

## 2014-02-08 ENCOUNTER — Ambulatory Visit (HOSPITAL_BASED_OUTPATIENT_CLINIC_OR_DEPARTMENT_OTHER): Payer: Medicare Other

## 2014-02-08 VITALS — BP 115/69 | HR 93 | Temp 98.2°F | Resp 18 | Ht 67.0 in | Wt 146.0 lb

## 2014-02-08 DIAGNOSIS — C7951 Secondary malignant neoplasm of bone: Secondary | ICD-10-CM | POA: Diagnosis not present

## 2014-02-08 DIAGNOSIS — C61 Malignant neoplasm of prostate: Secondary | ICD-10-CM | POA: Diagnosis not present

## 2014-02-08 DIAGNOSIS — Z5111 Encounter for antineoplastic chemotherapy: Secondary | ICD-10-CM | POA: Diagnosis not present

## 2014-02-08 LAB — COMPREHENSIVE METABOLIC PANEL (CC13)
ALT: 17 U/L (ref 0–55)
ANION GAP: 8 meq/L (ref 3–11)
AST: 26 U/L (ref 5–34)
Albumin: 3.4 g/dL — ABNORMAL LOW (ref 3.5–5.0)
Alkaline Phosphatase: 100 U/L (ref 40–150)
BILIRUBIN TOTAL: 0.46 mg/dL (ref 0.20–1.20)
BUN: 10.4 mg/dL (ref 7.0–26.0)
CO2: 29 meq/L (ref 22–29)
CREATININE: 0.8 mg/dL (ref 0.7–1.3)
Calcium: 9.5 mg/dL (ref 8.4–10.4)
Chloride: 104 mEq/L (ref 98–109)
Glucose: 62 mg/dl — ABNORMAL LOW (ref 70–140)
Potassium: 3.7 mEq/L (ref 3.5–5.1)
Sodium: 141 mEq/L (ref 136–145)
Total Protein: 6.5 g/dL (ref 6.4–8.3)

## 2014-02-08 LAB — CBC WITH DIFFERENTIAL/PLATELET
BASO%: 0.3 % (ref 0.0–2.0)
Basophils Absolute: 0 10*3/uL (ref 0.0–0.1)
EOS%: 2.1 % (ref 0.0–7.0)
Eosinophils Absolute: 0.2 10*3/uL (ref 0.0–0.5)
HEMATOCRIT: 37 % — AB (ref 38.4–49.9)
HGB: 11.9 g/dL — ABNORMAL LOW (ref 13.0–17.1)
LYMPH%: 18 % (ref 14.0–49.0)
MCH: 30.7 pg (ref 27.2–33.4)
MCHC: 32.2 g/dL (ref 32.0–36.0)
MCV: 95.4 fL (ref 79.3–98.0)
MONO#: 0.9 10*3/uL (ref 0.1–0.9)
MONO%: 12.1 % (ref 0.0–14.0)
NEUT#: 4.8 10*3/uL (ref 1.5–6.5)
NEUT%: 67.5 % (ref 39.0–75.0)
PLATELETS: 355 10*3/uL (ref 140–400)
RBC: 3.88 10*6/uL — ABNORMAL LOW (ref 4.20–5.82)
RDW: 16.1 % — ABNORMAL HIGH (ref 11.0–14.6)
WBC: 7.1 10*3/uL (ref 4.0–10.3)
lymph#: 1.3 10*3/uL (ref 0.9–3.3)

## 2014-02-08 MED ORDER — DOCETAXEL CHEMO INJECTION 160 MG/16ML
75.0000 mg/m2 | Freq: Once | INTRAVENOUS | Status: AC
  Administered 2014-02-08: 130 mg via INTRAVENOUS
  Filled 2014-02-08: qty 13

## 2014-02-08 MED ORDER — SODIUM CHLORIDE 0.9 % IV SOLN
Freq: Once | INTRAVENOUS | Status: AC
  Administered 2014-02-08: 12:00:00 via INTRAVENOUS

## 2014-02-08 MED ORDER — DEXAMETHASONE SODIUM PHOSPHATE 10 MG/ML IJ SOLN
10.0000 mg | Freq: Once | INTRAMUSCULAR | Status: AC
  Administered 2014-02-08: 10 mg via INTRAVENOUS

## 2014-02-08 MED ORDER — ONDANSETRON 8 MG/NS 50 ML IVPB
INTRAVENOUS | Status: AC
  Filled 2014-02-08: qty 8

## 2014-02-08 MED ORDER — ONDANSETRON 8 MG/50ML IVPB (CHCC)
8.0000 mg | Freq: Once | INTRAVENOUS | Status: AC
  Administered 2014-02-08: 8 mg via INTRAVENOUS

## 2014-02-08 MED ORDER — DEXAMETHASONE SODIUM PHOSPHATE 10 MG/ML IJ SOLN
INTRAMUSCULAR | Status: AC
  Filled 2014-02-08: qty 1

## 2014-02-08 NOTE — Patient Instructions (Signed)
[facility] Discharge Instructions for Patients Receiving Chemotherapy  Today you received the following chemotherapy agents: Taxotere  To help prevent nausea and vomiting after your treatment, we encourage you to take your nausea medication as prescribed.    If you develop nausea and vomiting that is not controlled by your nausea medication, call the clinic.   BELOW ARE SYMPTOMS THAT SHOULD BE REPORTED IMMEDIATELY:  *FEVER GREATER THAN 100.5 F  *CHILLS WITH OR WITHOUT FEVER  NAUSEA AND VOMITING THAT IS NOT CONTROLLED WITH YOUR NAUSEA MEDICATION  *UNUSUAL SHORTNESS OF BREATH  *UNUSUAL BRUISING OR BLEEDING  TENDERNESS IN MOUTH AND THROAT WITH OR WITHOUT PRESENCE OF ULCERS  *URINARY PROBLEMS  *BOWEL PROBLEMS  UNUSUAL RASH Items with * indicate a potential emergency and should be followed up as soon as possible.  Feel free to call the clinic you have any questions or concerns. The clinic phone number is [phone].    

## 2014-02-08 NOTE — Progress Notes (Signed)
Hematology and Oncology Follow Up Visit  [name] [name] [id number] 06-13-1940 73 y.o. 02/08/2014 2:27 PM   Principle Diagnosis: 73 year old gentleman with castration resistant metastatic prostate cancer with disease to the bone. His initial diagnosis was in 2012 where he presented with metastatic disease to the rib. His PSA was 187.   Prior Therapy:  He was found to have a mass in the right axilla near the fourth rib encasing the right with some sclerosis. He underwent a right VATS procedure and resection of the third, fourth and fifth rib and reconstruction with titanium plates under the care of Dr. Arlyce Dice on November 13 2010.  He went on to receive androgen deprivation therapy under the care of Dr. Gaynelle Arabian. His PSA nadir was to 0.1 back in May of 2013 and subsequently started to rise.  He was treated with Provenge in the fall of 2014.  After his PSA went up to 178.6 and Xtandi was started in February of 2015 and discontinued in 09/2013 due to progression of disease and poor tolerance.  Current therapy: Taxotere chemotherapy started on 10/05/2013. He is here for cycle 7.  Interim History:  [name] [name] for a followup visit.  Since his last visit, he tolerated the last cycle of chemotherapy without any complications. He continues to have excellent clinical benefit from systemic chemotherapy. He did not report any nausea or vomiting. Did not report any peripheral neuropathy. Did not report any infusion-related complications. He does report some bone pain associated with Neulasta but otherwise no other complications. He's had some recent issues with tachycardia with heart rates between 160 -175 beats per minute. He has a history of tachycardia. He was previously on atenolol. He was evaluated by his cardiologist who felt that dehydration may have been playing a role. He has been pushing fluids and a followup appointment in 3 weeks. He may be placed back on a tall. His appetite has  improved His quality of life has slightly improved and his pain has also improved as well. His mobility and performance status remains the same.Marland Kitchen He does not report any headaches or blurry vision or double vision. Has not reported any syncope or change in his mental status. He does have memory issues and is currently under evaluation for that. He is not reporting any chest pain or shortness of breath. Is not reporting any cough or hemoptysis. Is that report any palpitation or leg edema. Does not report any frequency urgency or hesitancy. Does not report any skeletal complications. Does not report any lymphadenopathy or petechiae. Remainder of his review of systems unremarkable.  Medications: I have reviewed the patient's current medications.  Current Outpatient Prescriptions  Medication Sig Dispense Refill  . acetaminophen (TYLENOL) 325 MG tablet Take 650 mg by mouth every 6 (six) hours as needed.      Marland Kitchen aspirin EC 81 MG tablet Take 81 mg by mouth at bedtime.      . Calcium Citrate-Vitamin D (CITRACAL + D PO) Take 3 tablets by mouth every morning.      . mirtazapine (REMERON) 15 MG tablet Take 15 mg by mouth at bedtime.      . simvastatin (ZOCOR) 80 MG tablet Take 80 mg by mouth at bedtime.        . prochlorperazine (COMPAZINE) 10 MG tablet Take 1 tablet (10 mg total) by mouth every 6 (six) hours as needed for nausea or vomiting.  30 tablet  0   No current facility-administered medications for this visit.  Allergies:  Allergies  Allergen Reactions  . Oxycodone Nausea And Vomiting  . Penicillins Itching and Rash    Past Medical History, Surgical history, Social history, and Family History were reviewed and updated.   Physical Exam: Blood pressure 115/69, pulse 93, temperature 98.2 F (36.8 C), temperature source Oral, resp. rate 18, height 5\' 7"  (1.702 m), weight 146 lb (66.225 kg), SpO2 98.00%. ECOG:  1 General appearance: alert and cooperative Head: Normocephalic, without obvious  abnormality Neck: no adenopathy Lymph nodes: Cervical, supraclavicular, and axillary nodes normal. Heart:regular rate and rhythm, S1, S2 normal, no murmur, click, rub or gallop Lung:chest clear, no wheezing, rales, normal symmetric air entry Abdomin: soft, non-tender, without masses or organomegaly EXT:no erythema, induration, or nodules   Lab Results: Lab Results  Component Value Date   WBC 7.1 02/08/2014   HGB 11.9* 02/08/2014   HCT 37.0* 02/08/2014   MCV 95.4 02/08/2014   PLT 355 02/08/2014     Chemistry      Component Value Date/Time   NA 141 02/08/2014 0931   NA 136 02/06/2013 1157   K 3.7 02/08/2014 0931   K 3.9 02/06/2013 1157   CL 98 02/06/2013 1157   CO2 29 02/08/2014 0931   CO2 28 02/06/2013 1157   BUN 10.4 02/08/2014 0931   BUN 15 02/06/2013 1157   CREATININE 0.8 02/08/2014 0931   CREATININE 0.87 02/06/2013 1157      Component Value Date/Time   CALCIUM 9.5 02/08/2014 0931   CALCIUM 10.2 02/06/2013 1157   ALKPHOS 100 02/08/2014 0931   ALKPHOS 82 11/15/2010 0300   AST 26 02/08/2014 0931   AST 31 11/15/2010 0300   ALT 17 02/08/2014 0931   ALT 16 11/15/2010 0300   BILITOT 0.46 02/08/2014 0931   BILITOT 0.5 11/15/2010 0300      Results for [name] [name], [name] [name] (MRN [id number]) as of 01/18/2014 08:43  Ref. Range 11/16/2013 11:44 12/07/2013 11:18 12/28/2013 11:38  PSA Latest Range: <=4.00 ng/mL 2.00 1.55 1.22      Impression and Plan:  73 year old gentleman with the following issues:  1. 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 He is currently receiving Taxotere chemotherapy and ready to proceed with  Cycle 6. He tolerated chemotherapy well so far with excellent response in his PSA. His PSA down to 1.22 from 292. The plan is to continue with the current dose and schedule given his excellent response and excellent clinical benefit.  2.Bone pain: Improved with systemic chemotherapy.  3. Bone directed therapy: We hold off  on any bone directed therapy for the time being.  4. Nausea and vomiting: Likely related to Xtandi medication and seems to be improving after stopping this current medication. He is currently prescribed antiemetics to use and have used empirically infrequently.  5. IV access: continue to discuss with him the role of Port-A-Cath and he would like to continue with peripheral veins for the time being. If his veins become an issue we will proceed with a Port-A-Cath insertion.  6. Neutropenia prophylaxis: He will continue Neulasta after each chemotherapy cycle  7. Followup: Will be in 3 weeks for the next chemotherapy cycle.   Adriyanna Christians E, PA-C  10/22/20152:27 PM

## 2014-02-09 ENCOUNTER — Ambulatory Visit (HOSPITAL_BASED_OUTPATIENT_CLINIC_OR_DEPARTMENT_OTHER): Payer: Medicare Other

## 2014-02-09 ENCOUNTER — Ambulatory Visit: Payer: Medicare Other

## 2014-02-09 VITALS — BP 112/64 | HR 92 | Temp 97.7°F

## 2014-02-09 DIAGNOSIS — Z5189 Encounter for other specified aftercare: Secondary | ICD-10-CM | POA: Diagnosis not present

## 2014-02-09 DIAGNOSIS — C7951 Secondary malignant neoplasm of bone: Secondary | ICD-10-CM | POA: Diagnosis not present

## 2014-02-09 DIAGNOSIS — C61 Malignant neoplasm of prostate: Secondary | ICD-10-CM | POA: Diagnosis not present

## 2014-02-09 LAB — PSA: PSA: 0.97 ng/mL (ref <=4.00)

## 2014-02-09 MED ORDER — PEGFILGRASTIM INJECTION 6 MG/0.6ML
6.0000 mg | Freq: Once | SUBCUTANEOUS | Status: AC
  Administered 2014-02-09: 6 mg via SUBCUTANEOUS
  Filled 2014-02-09: qty 0.6

## 2014-02-09 NOTE — Patient Instructions (Signed)
Pegfilgrastim injection What is this medicine? PEGFILGRASTIM (peg fil GRA stim) is a long-acting granulocyte colony-stimulating factor that stimulates the growth of neutrophils, a type of white blood cell important in the body's fight against infection. It is used to reduce the incidence of fever and infection in patients with certain types of cancer who are receiving chemotherapy that affects the bone marrow. This medicine may be used for other purposes; ask your health care provider or pharmacist if you have questions. COMMON BRAND NAME(S): Neulasta What should I tell my health care provider before I take this medicine? They need to know if you have any of these conditions: -latex allergy -ongoing radiation therapy -sickle cell disease -skin reactions to acrylic adhesives (On-Body Injector only) -an unusual or allergic reaction to pegfilgrastim, filgrastim, other medicines, foods, dyes, or preservatives -pregnant or trying to get pregnant -breast-feeding How should I use this medicine? This medicine is for injection under the skin. If you get this medicine at home, you will be taught how to prepare and give the pre-filled syringe or how to use the On-body Injector. Refer to the patient Instructions for Use for detailed instructions. Use exactly as directed. Take your medicine at regular intervals. Do not take your medicine more often than directed. It is important that you put your used needles and syringes in a special sharps container. Do not put them in a trash can. If you do not have a sharps container, call your pharmacist or healthcare provider to get one. Talk to your pediatrician regarding the use of this medicine in children. Special care may be needed. Overdosage: If you think you have taken too much of this medicine contact a poison control center or emergency room at once. NOTE: This medicine is only for you. Do not share this medicine with others. What if I miss a dose? It is  important not to miss your dose. Call your doctor or health care professional if you miss your dose. If you miss a dose due to an On-body Injector failure or leakage, a new dose should be administered as soon as possible using a single prefilled syringe for manual use. What may interact with this medicine? Interactions have not been studied. Give your health care provider a list of all the medicines, herbs, non-prescription drugs, or dietary supplements you use. Also tell them if you smoke, drink alcohol, or use illegal drugs. Some items may interact with your medicine. This list may not describe all possible interactions. Give your health care provider a list of all the medicines, herbs, non-prescription drugs, or dietary supplements you use. Also tell them if you smoke, drink alcohol, or use illegal drugs. Some items may interact with your medicine. What should I watch for while using this medicine? You may need blood work done while you are taking this medicine. If you are going to need a MRI, CT scan, or other procedure, tell your doctor that you are using this medicine (On-Body Injector only). What side effects may I notice from receiving this medicine? Side effects that you should report to your doctor or health care professional as soon as possible: -allergic reactions like skin rash, itching or hives, swelling of the face, lips, or tongue -dizziness -fever -pain, redness, or irritation at site where injected -pinpoint red spots on the skin -shortness of breath or breathing problems -stomach or side pain, or pain at the shoulder -swelling -tiredness -trouble passing urine Side effects that usually do not require medical attention (report to your doctor   or health care professional if they continue or are bothersome): -bone pain -muscle pain This list may not describe all possible side effects. Call your doctor for medical advice about side effects. You may report side effects to FDA at  1-800-FDA-1088. Where should I keep my medicine? Keep out of the reach of children. Store pre-filled syringes in a refrigerator between 2 and 8 degrees C (36 and 46 degrees F). Do not freeze. Keep in carton to protect from light. Throw away this medicine if it is left out of the refrigerator for more than 48 hours. Throw away any unused medicine after the expiration date. NOTE: This sheet is a summary. It may not cover all possible information. If you have questions about this medicine, talk to your doctor, pharmacist, or health care provider.  2015, [employer]. (2013-07-06 16:14:05)  

## 2014-02-11 NOTE — Patient Instructions (Signed)
Continue labs and chemotherapy as scheduled Follow up in 3 weeks 

## 2014-02-23 DIAGNOSIS — R000 Tachycardia, unspecified: Secondary | ICD-10-CM | POA: Diagnosis not present

## 2014-02-23 DIAGNOSIS — I2510 Atherosclerotic heart disease of native coronary artery without angina pectoris: Secondary | ICD-10-CM | POA: Diagnosis not present

## 2014-03-01 ENCOUNTER — Ambulatory Visit (HOSPITAL_COMMUNITY)
Admission: RE | Admit: 2014-03-01 | Discharge: 2014-03-01 | Disposition: A | Discharge status: Home / Self Care | Payer: Medicare Other | Source: Ambulatory Visit | Attending: Oncology | Admitting: Oncology

## 2014-03-01 ENCOUNTER — Telehealth: Payer: Self-pay | Admitting: Medical Oncology

## 2014-03-01 ENCOUNTER — Telehealth: Payer: Self-pay | Admitting: Oncology

## 2014-03-01 ENCOUNTER — Ambulatory Visit (HOSPITAL_BASED_OUTPATIENT_CLINIC_OR_DEPARTMENT_OTHER): Payer: Medicare Other | Admitting: Oncology

## 2014-03-01 ENCOUNTER — Other Ambulatory Visit (HOSPITAL_BASED_OUTPATIENT_CLINIC_OR_DEPARTMENT_OTHER): Payer: Medicare Other

## 2014-03-01 ENCOUNTER — Ambulatory Visit (HOSPITAL_BASED_OUTPATIENT_CLINIC_OR_DEPARTMENT_OTHER): Payer: Medicare Other

## 2014-03-01 VITALS — BP 108/72 | HR 90 | Temp 97.4°F | Resp 18 | Ht 67.0 in | Wt 144.2 lb

## 2014-03-01 DIAGNOSIS — Z5111 Encounter for antineoplastic chemotherapy: Secondary | ICD-10-CM

## 2014-03-01 DIAGNOSIS — C61 Malignant neoplasm of prostate: Secondary | ICD-10-CM

## 2014-03-01 DIAGNOSIS — R05 Cough: Secondary | ICD-10-CM | POA: Insufficient documentation

## 2014-03-01 DIAGNOSIS — M898X9 Other specified disorders of bone, unspecified site: Secondary | ICD-10-CM

## 2014-03-01 DIAGNOSIS — C7951 Secondary malignant neoplasm of bone: Secondary | ICD-10-CM

## 2014-03-01 DIAGNOSIS — R0602 Shortness of breath: Secondary | ICD-10-CM | POA: Insufficient documentation

## 2014-03-01 LAB — COMPREHENSIVE METABOLIC PANEL (CC13)
ALBUMIN: 3.3 g/dL — AB (ref 3.5–5.0)
ALT: 13 U/L (ref 0–55)
ANION GAP: 6 meq/L (ref 3–11)
AST: 25 U/L (ref 5–34)
Alkaline Phosphatase: 95 U/L (ref 40–150)
BUN: 11.2 mg/dL (ref 7.0–26.0)
CALCIUM: 9.6 mg/dL (ref 8.4–10.4)
CHLORIDE: 104 meq/L (ref 98–109)
CO2: 27 meq/L (ref 22–29)
CREATININE: 0.8 mg/dL (ref 0.7–1.3)
Glucose: 76 mg/dl (ref 70–140)
Potassium: 4 mEq/L (ref 3.5–5.1)
Sodium: 137 mEq/L (ref 136–145)
Total Bilirubin: 0.49 mg/dL (ref 0.20–1.20)
Total Protein: 6.3 g/dL — ABNORMAL LOW (ref 6.4–8.3)

## 2014-03-01 LAB — CBC WITH DIFFERENTIAL/PLATELET
BASO%: 2.4 % — ABNORMAL HIGH (ref 0.0–2.0)
Basophils Absolute: 0.2 10*3/uL — ABNORMAL HIGH (ref 0.0–0.1)
EOS ABS: 0.2 10*3/uL (ref 0.0–0.5)
EOS%: 2.4 % (ref 0.0–7.0)
HEMATOCRIT: 35.4 % — AB (ref 38.4–49.9)
HEMOGLOBIN: 11.6 g/dL — AB (ref 13.0–17.1)
LYMPH%: 17.1 % (ref 14.0–49.0)
MCH: 30.4 pg (ref 27.2–33.4)
MCHC: 32.8 g/dL (ref 32.0–36.0)
MCV: 92.9 fL (ref 79.3–98.0)
MONO#: 0.7 10*3/uL (ref 0.1–0.9)
MONO%: 10.3 % (ref 0.0–14.0)
NEUT%: 67.8 % (ref 39.0–75.0)
NEUTROS ABS: 4.9 10*3/uL (ref 1.5–6.5)
PLATELETS: 312 10*3/uL (ref 140–400)
RBC: 3.81 10*6/uL — ABNORMAL LOW (ref 4.20–5.82)
RDW: 16 % — ABNORMAL HIGH (ref 11.0–14.6)
WBC: 7.2 10*3/uL (ref 4.0–10.3)
lymph#: 1.2 10*3/uL (ref 0.9–3.3)
nRBC: 0 % (ref 0–0)

## 2014-03-01 MED ORDER — SODIUM CHLORIDE 0.9 % IV SOLN
Freq: Once | INTRAVENOUS | Status: AC
  Administered 2014-03-01: 11:00:00 via INTRAVENOUS

## 2014-03-01 MED ORDER — ONDANSETRON 8 MG/50ML IVPB (CHCC)
8.0000 mg | Freq: Once | INTRAVENOUS | Status: AC
  Administered 2014-03-01: 8 mg via INTRAVENOUS

## 2014-03-01 MED ORDER — DEXTROSE 5 % IV SOLN
75.0000 mg/m2 | Freq: Once | INTRAVENOUS | Status: AC
  Administered 2014-03-01: 130 mg via INTRAVENOUS
  Filled 2014-03-01: qty 13

## 2014-03-01 MED ORDER — DEXAMETHASONE SODIUM PHOSPHATE 10 MG/ML IJ SOLN
10.0000 mg | Freq: Once | INTRAMUSCULAR | Status: AC
  Administered 2014-03-01: 10 mg via INTRAVENOUS

## 2014-03-01 MED ORDER — ONDANSETRON 8 MG/NS 50 ML IVPB
INTRAVENOUS | Status: AC
  Filled 2014-03-01: qty 8

## 2014-03-01 MED ORDER — DEXAMETHASONE SODIUM PHOSPHATE 10 MG/ML IJ SOLN
INTRAMUSCULAR | Status: AC
  Filled 2014-03-01: qty 1

## 2014-03-01 NOTE — Patient Instructions (Signed)
[facility] Discharge Instructions for Patients Receiving Chemotherapy  Today you received the following chemotherapy agents Docetaxel.  To help prevent nausea and vomiting after your treatment, we encourage you to take your nausea medication as directed.    If you develop nausea and vomiting that is not controlled by your nausea medication, call the clinic.   BELOW ARE SYMPTOMS THAT SHOULD BE REPORTED IMMEDIATELY:  *FEVER GREATER THAN 100.5 F  *CHILLS WITH OR WITHOUT FEVER  NAUSEA AND VOMITING THAT IS NOT CONTROLLED WITH YOUR NAUSEA MEDICATION  *UNUSUAL SHORTNESS OF BREATH  *UNUSUAL BRUISING OR BLEEDING  TENDERNESS IN MOUTH AND THROAT WITH OR WITHOUT PRESENCE OF ULCERS  *URINARY PROBLEMS  *BOWEL PROBLEMS  UNUSUAL RASH Items with * indicate a potential emergency and should be followed up as soon as possible.  Feel free to call the clinic you have any questions or concerns. The clinic phone number is [phone].    

## 2014-03-01 NOTE — Progress Notes (Signed)
Hematology and Oncology Follow Up Visit  [name] [name] [id number] [birth year] 73 y.o. 03/01/2014 9:34 AM   Principle Diagnosis: 73 year old gentleman with castration resistant metastatic prostate cancer with disease to the bone. His initial diagnosis was in 2012 where he presented with metastatic disease to the rib. His PSA was 187.   Prior Therapy:  He was found to have a mass in the right axilla near the fourth rib encasing the right with some sclerosis. He underwent a right VATS procedure and resection of the third, fourth and fifth rib and reconstruction with titanium plates under the care of Dr. Arlyce Dice on November 13 2010.  He went on to receive androgen deprivation therapy under the care of Dr. Gaynelle Arabian. His PSA nadir was to 0.1 back in May of 2013 and subsequently started to rise.  He was treated with Provenge in the fall of 2014.  After his PSA went up to 178.6 and Xtandi was started in February of 2015 and discontinued in 09/2013 due to progression of disease and poor tolerance.  Current therapy: Taxotere chemotherapy started on 10/05/2013. He is here for cycle 8.  Interim History:  [name] [name] presents today for a followup visit.  Since his last visit, he is not reporting any new complications. He tolerated the last cycle of chemotherapy without any issues. He continues to have excellent clinical benefit from systemic chemotherapy. He has reported increased cough that is slightly productive with white mucous. He has been evaluated by his primary care physician and was started on allergy medication and decongestant. He did not report any nausea or vomiting. Did not report any peripheral neuropathy. Did not report any infusion-related complications. He does report some bone pain associated with Neulasta but otherwise no other complications. His appetite has improved. His pain has also improved dramatically as well.His mobility and performance status remains the same at this time. He does not  report any headaches or blurry vision or double vision. Has not reported any syncope or change in his mental status. He does have memory issues and is currently under evaluation for that. He is not reporting any chest pain or shortness of breath. Is not reporting any cough or hemoptysis. Is that report any palpitation or leg edema. Does not report any frequency urgency or hesitancy. Does not report any skeletal complications. Does not report any lymphadenopathy or petechiae. Rest of his review of systems unremarkable.  Medications: I have reviewed the patient's current medications.  Current Outpatient Prescriptions  Medication Sig Dispense Refill  . guaiFENesin (MUCINEX) 600 MG 12 hr tablet Take 600 mg by mouth 2 (two) times daily.    Marland Kitchen acetaminophen (TYLENOL) 325 MG tablet Take 650 mg by mouth every 6 (six) hours as needed.    Marland Kitchen aspirin EC 81 MG tablet Take 81 mg by mouth at bedtime.    . Calcium Citrate-Vitamin D (CITRACAL + D PO) Take 3 tablets by mouth every morning.    . mirtazapine (REMERON) 15 MG tablet Take 15 mg by mouth at bedtime.    . prochlorperazine (COMPAZINE) 10 MG tablet Take 1 tablet (10 mg total) by mouth every 6 (six) hours as needed for nausea or vomiting. 30 tablet 0  . simvastatin (ZOCOR) 80 MG tablet Take 80 mg by mouth at bedtime.       No current facility-administered medications for this visit.     Allergies:  Allergies  Allergen Reactions  . Oxycodone Nausea And Vomiting  . Penicillins Itching and Rash  Past Medical History, Surgical history, Social history, and Family History were reviewed and updated.   Physical Exam: Blood pressure 108/72, pulse 90, temperature 97.4 F (36.3 C), temperature source Oral, resp. rate 18, height 5\' 7"  (1.702 m), weight 144 lb 3.2 oz (65.409 kg). ECOG:  1 General appearance: alert and cooperative Head: Normocephalic, without obvious abnormality Neck: no adenopathy Lymph nodes: Cervical, supraclavicular, and axillary nodes  normal. Heart:regular rate and rhythm, S1, S2 normal, no murmur, click, rub or gallop Lung:chest clear, no wheezing, rales, normal symmetric air entry Abdomin: soft, non-tender, without masses or organomegaly EXT:no erythema, induration, or nodules.  Neurological: No deficits noted.  Lab Results: Lab Results  Component Value Date   WBC 7.1 02/08/2014   HGB 11.9* 02/08/2014   HCT 37.0* 02/08/2014   MCV 95.4 02/08/2014   PLT 355 02/08/2014     Chemistry      Component Value Date/Time   NA 141 02/08/2014 0931   NA 136 02/06/2013 1157   K 3.7 02/08/2014 0931   K 3.9 02/06/2013 1157   CL 98 02/06/2013 1157   CO2 29 02/08/2014 0931   CO2 28 02/06/2013 1157   BUN 10.4 02/08/2014 0931   BUN 15 02/06/2013 1157   CREATININE 0.8 02/08/2014 0931   CREATININE 0.87 02/06/2013 1157      Component Value Date/Time   CALCIUM 9.5 02/08/2014 0931   CALCIUM 10.2 02/06/2013 1157   ALKPHOS 100 02/08/2014 0931   ALKPHOS 82 11/15/2010 0300   AST 26 02/08/2014 0931   AST 31 11/15/2010 0300   ALT 17 02/08/2014 0931   ALT 16 11/15/2010 0300   BILITOT 0.46 02/08/2014 0931   BILITOT 0.5 11/15/2010 0300       Results for [name] [name] (MRN [id number]) as of 03/01/2014 09:06  Ref. Range 11/16/2013 11:44 12/07/2013 11:18 12/28/2013 11:38 01/18/2014 09:34 02/08/2014 09:31  PSA Latest Range: <=4.00 ng/mL 2.00 1.55 1.22 1.20 0.97      Impression and Plan:  73 year old gentleman with the following issues:  1. 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 He is currently receiving Taxotere chemotherapy and ready to proceed with  Cycle 8. He tolerated chemotherapy well so far with excellent response in his PSA. His PSA down to 0.97 from 292. The plan is to continue with the current dose and schedule given his excellent response and excellent clinical benefit. His laboratory data from today are currently pending and has they are within normal range we'll  proceed as scheduled.  2.Bone pain: Improved with systemic chemotherapy.  3. Reductive cough: Likely related due to seasonal allergy but will obtain a chest x-ray to rule out pleural effusion which is a potential complication from Taxotere.  4. Nausea prophylaxis: He has antiemetics prescribed and has not needed it at this time.  5. IV access: continue to discuss with him the role of Port-A-Cath and he would like to continue with peripheral veins for the time being. If his veins become an issue we will proceed with a Port-A-Cath insertion.  6. Neutropenia prophylaxis: He will continue Neulasta after each chemotherapy cycle  7. Followup: Will be in 3 weeks for the next chemotherapy cycle.   [facility], MD 11/12/20159:34 AM

## 2014-03-01 NOTE — Telephone Encounter (Signed)
gv and printed appt sched and avs for pt for NOV and DEC....sed added tx. °

## 2014-03-01 NOTE — Telephone Encounter (Signed)
Per MD, informed wife of results, no reason for cough. Wife expressed thanks, stated she will continue with OTC allergy and cough medicine. They know to contact office/clinic with further questions or concerns.

## 2014-03-01 NOTE — Telephone Encounter (Signed)
-----   Message from Wyatt Portela, MD sent at 03/01/2014  3:50 PM EST ----- Please call him with the results. No reason for his cough.

## 2014-03-02 ENCOUNTER — Ambulatory Visit (HOSPITAL_BASED_OUTPATIENT_CLINIC_OR_DEPARTMENT_OTHER): Payer: Medicare Other

## 2014-03-02 DIAGNOSIS — C7951 Secondary malignant neoplasm of bone: Secondary | ICD-10-CM

## 2014-03-02 DIAGNOSIS — C61 Malignant neoplasm of prostate: Secondary | ICD-10-CM | POA: Diagnosis not present

## 2014-03-02 DIAGNOSIS — Z5189 Encounter for other specified aftercare: Secondary | ICD-10-CM | POA: Diagnosis not present

## 2014-03-02 LAB — PSA: PSA: 0.98 ng/mL (ref <=4.00)

## 2014-03-02 MED ORDER — PEGFILGRASTIM INJECTION 6 MG/0.6ML ~~LOC~~
6.0000 mg | PREFILLED_SYRINGE | Freq: Once | SUBCUTANEOUS | Status: AC
  Administered 2014-03-02: 6 mg via SUBCUTANEOUS
  Filled 2014-03-02: qty 0.6

## 2014-03-02 NOTE — Patient Instructions (Signed)
Pegfilgrastim injection What is this medicine? PEGFILGRASTIM (peg fil GRA stim) is a long-acting granulocyte colony-stimulating factor that stimulates the growth of neutrophils, a type of white blood cell important in the body's fight against infection. It is used to reduce the incidence of fever and infection in patients with certain types of cancer who are receiving chemotherapy that affects the bone marrow. This medicine may be used for other purposes; ask your health care provider or pharmacist if you have questions. COMMON BRAND NAME(S): Neulasta What should I tell my health care provider before I take this medicine? They need to know if you have any of these conditions: -latex allergy -ongoing radiation therapy -sickle cell disease -skin reactions to acrylic adhesives (On-Body Injector only) -an unusual or allergic reaction to pegfilgrastim, filgrastim, other medicines, foods, dyes, or preservatives -pregnant or trying to get pregnant -breast-feeding How should I use this medicine? This medicine is for injection under the skin. If you get this medicine at home, you will be taught how to prepare and give the pre-filled syringe or how to use the On-body Injector. Refer to the patient Instructions for Use for detailed instructions. Use exactly as directed. Take your medicine at regular intervals. Do not take your medicine more often than directed. It is important that you put your used needles and syringes in a special sharps container. Do not put them in a trash can. If you do not have a sharps container, call your pharmacist or healthcare provider to get one. Talk to your pediatrician regarding the use of this medicine in children. Special care may be needed. Overdosage: If you think you have taken too much of this medicine contact a poison control center or emergency room at once. NOTE: This medicine is only for you. Do not share this medicine with others. What if I miss a dose? It is  important not to miss your dose. Call your doctor or health care professional if you miss your dose. If you miss a dose due to an On-body Injector failure or leakage, a new dose should be administered as soon as possible using a single prefilled syringe for manual use. What may interact with this medicine? Interactions have not been studied. Give your health care provider a list of all the medicines, herbs, non-prescription drugs, or dietary supplements you use. Also tell them if you smoke, drink alcohol, or use illegal drugs. Some items may interact with your medicine. This list may not describe all possible interactions. Give your health care provider a list of all the medicines, herbs, non-prescription drugs, or dietary supplements you use. Also tell them if you smoke, drink alcohol, or use illegal drugs. Some items may interact with your medicine. What should I watch for while using this medicine? You may need blood work done while you are taking this medicine. If you are going to need a MRI, CT scan, or other procedure, tell your doctor that you are using this medicine (On-Body Injector only). What side effects may I notice from receiving this medicine? Side effects that you should report to your doctor or health care professional as soon as possible: -allergic reactions like skin rash, itching or hives, swelling of the face, lips, or tongue -dizziness -fever -pain, redness, or irritation at site where injected -pinpoint red spots on the skin -shortness of breath or breathing problems -stomach or side pain, or pain at the shoulder -swelling -tiredness -trouble passing urine Side effects that usually do not require medical attention (report to your doctor   or health care professional if they continue or are bothersome): -bone pain -muscle pain This list may not describe all possible side effects. Call your doctor for medical advice about side effects. You may report side effects to FDA at  1-800-FDA-1088. Where should I keep my medicine? Keep out of the reach of children. Store pre-filled syringes in a refrigerator between 2 and 8 degrees C (36 and 46 degrees F). Do not freeze. Keep in carton to protect from light. Throw away this medicine if it is left out of the refrigerator for more than 48 hours. Throw away any unused medicine after the expiration date. NOTE: This sheet is a summary. It may not cover all possible information. If you have questions about this medicine, talk to your doctor, pharmacist, or health care provider.  2015, [employer]. (2013-07-06 16:14:05)  

## 2014-03-14 ENCOUNTER — Telehealth: Payer: Self-pay | Admitting: *Deleted

## 2014-03-14 ENCOUNTER — Encounter (HOSPITAL_COMMUNITY)
Admission: EM | Disposition: A | Discharge status: Home / Self Care | Payer: Self-pay | Source: Home / Self Care | Attending: Internal Medicine

## 2014-03-14 ENCOUNTER — Inpatient Hospital Stay (HOSPITAL_COMMUNITY)
Admission: EM | Admit: 2014-03-14 | Discharge: 2014-03-15 | DRG: 381 | Disposition: A | Discharge status: Home / Self Care | Payer: Medicare Other | Attending: Internal Medicine | Admitting: Internal Medicine

## 2014-03-14 ENCOUNTER — Encounter (HOSPITAL_COMMUNITY): Payer: Self-pay | Admitting: Cardiology

## 2014-03-14 DIAGNOSIS — E785 Hyperlipidemia, unspecified: Secondary | ICD-10-CM | POA: Diagnosis present

## 2014-03-14 DIAGNOSIS — K2210 Ulcer of esophagus without bleeding: Secondary | ICD-10-CM

## 2014-03-14 DIAGNOSIS — F419 Anxiety disorder, unspecified: Secondary | ICD-10-CM | POA: Diagnosis present

## 2014-03-14 DIAGNOSIS — C61 Malignant neoplasm of prostate: Secondary | ICD-10-CM | POA: Diagnosis present

## 2014-03-14 DIAGNOSIS — Z9889 Other specified postprocedural states: Secondary | ICD-10-CM

## 2014-03-14 DIAGNOSIS — D62 Acute posthemorrhagic anemia: Secondary | ICD-10-CM | POA: Diagnosis not present

## 2014-03-14 DIAGNOSIS — I10 Essential (primary) hypertension: Secondary | ICD-10-CM | POA: Diagnosis present

## 2014-03-14 DIAGNOSIS — R531 Weakness: Secondary | ICD-10-CM | POA: Diagnosis present

## 2014-03-14 DIAGNOSIS — Z87891 Personal history of nicotine dependence: Secondary | ICD-10-CM | POA: Diagnosis not present

## 2014-03-14 DIAGNOSIS — D649 Anemia, unspecified: Secondary | ICD-10-CM

## 2014-03-14 DIAGNOSIS — K920 Hematemesis: Secondary | ICD-10-CM | POA: Diagnosis present

## 2014-03-14 DIAGNOSIS — K2211 Ulcer of esophagus with bleeding: Principal | ICD-10-CM | POA: Diagnosis present

## 2014-03-14 DIAGNOSIS — R000 Tachycardia, unspecified: Secondary | ICD-10-CM | POA: Diagnosis not present

## 2014-03-14 DIAGNOSIS — Z7982 Long term (current) use of aspirin: Secondary | ICD-10-CM

## 2014-03-14 DIAGNOSIS — K922 Gastrointestinal hemorrhage, unspecified: Secondary | ICD-10-CM | POA: Diagnosis not present

## 2014-03-14 DIAGNOSIS — K210 Gastro-esophageal reflux disease with esophagitis: Secondary | ICD-10-CM | POA: Diagnosis present

## 2014-03-14 DIAGNOSIS — I959 Hypotension, unspecified: Secondary | ICD-10-CM | POA: Diagnosis present

## 2014-03-14 DIAGNOSIS — K449 Diaphragmatic hernia without obstruction or gangrene: Secondary | ICD-10-CM | POA: Diagnosis present

## 2014-03-14 DIAGNOSIS — Z885 Allergy status to narcotic agent status: Secondary | ICD-10-CM

## 2014-03-14 DIAGNOSIS — K2270 Barrett's esophagus without dysplasia: Secondary | ICD-10-CM

## 2014-03-14 DIAGNOSIS — Z79899 Other long term (current) drug therapy: Secondary | ICD-10-CM | POA: Diagnosis not present

## 2014-03-14 DIAGNOSIS — Z880 Allergy status to penicillin: Secondary | ICD-10-CM

## 2014-03-14 DIAGNOSIS — C7951 Secondary malignant neoplasm of bone: Secondary | ICD-10-CM | POA: Diagnosis present

## 2014-03-14 HISTORY — PX: ESOPHAGOGASTRODUODENOSCOPY: SHX5428

## 2014-03-14 LAB — CBC WITH DIFFERENTIAL/PLATELET
Basophils Absolute: 0 K/uL (ref 0.0–0.1)
Basophils Relative: 0 % (ref 0–1)
Eosinophils Absolute: 0 K/uL (ref 0.0–0.7)
Eosinophils Relative: 0 % (ref 0–5)
HCT: 23.4 % — ABNORMAL LOW (ref 39.0–52.0)
Hemoglobin: 7.6 g/dL — ABNORMAL LOW (ref 13.0–17.0)
Lymphocytes Relative: 9 % — ABNORMAL LOW (ref 12–46)
Lymphs Abs: 3 K/uL (ref 0.7–4.0)
MCH: 30.5 pg (ref 26.0–34.0)
MCHC: 32.5 g/dL (ref 30.0–36.0)
MCV: 94 fL (ref 78.0–100.0)
Monocytes Absolute: 1.4 K/uL — ABNORMAL HIGH (ref 0.1–1.0)
Monocytes Relative: 4 % (ref 3–12)
Neutro Abs: 29.4 K/uL — ABNORMAL HIGH (ref 1.7–7.7)
Neutrophils Relative %: 87 % — ABNORMAL HIGH (ref 43–77)
Platelets: 292 K/uL (ref 150–400)
RBC: 2.49 MIL/uL — ABNORMAL LOW (ref 4.22–5.81)
RDW: 15.7 % — ABNORMAL HIGH (ref 11.5–15.5)
WBC: 33.8 K/uL — ABNORMAL HIGH (ref 4.0–10.5)

## 2014-03-14 LAB — COMPREHENSIVE METABOLIC PANEL WITH GFR
ALT: 9 U/L (ref 0–53)
AST: 18 U/L (ref 0–37)
Albumin: 2.7 g/dL — ABNORMAL LOW (ref 3.5–5.2)
Alkaline Phosphatase: 106 U/L (ref 39–117)
Anion gap: 14 (ref 5–15)
BUN: 50 mg/dL — ABNORMAL HIGH (ref 6–23)
CO2: 25 meq/L (ref 19–32)
Calcium: 8.6 mg/dL (ref 8.4–10.5)
Chloride: 99 meq/L (ref 96–112)
Creatinine, Ser: 0.83 mg/dL (ref 0.50–1.35)
GFR calc Af Amer: >90 mL/min
GFR calc non Af Amer: 86 mL/min — ABNORMAL LOW
Glucose, Bld: 161 mg/dL — ABNORMAL HIGH (ref 70–99)
Potassium: 4.4 meq/L (ref 3.7–5.3)
Sodium: 138 meq/L (ref 137–147)
Total Bilirubin: 0.4 mg/dL (ref 0.3–1.2)
Total Protein: 5.4 g/dL — ABNORMAL LOW (ref 6.0–8.3)

## 2014-03-14 LAB — POC OCCULT BLOOD, ED: Fecal Occult Bld: POSITIVE — AB

## 2014-03-14 LAB — HEMOGLOBIN
HEMOGLOBIN: 7.1 g/dL — AB (ref 13.0–17.0)
Hemoglobin: 7.1 g/dL — ABNORMAL LOW (ref 13.0–17.0)

## 2014-03-14 LAB — APTT: APTT: 28 s (ref 24–37)

## 2014-03-14 LAB — PROTIME-INR
INR: 1.18 (ref 0.00–1.49)
Prothrombin Time: 15.2 seconds (ref 11.6–15.2)

## 2014-03-14 LAB — ABO/RH: ABO/RH(D): O POS

## 2014-03-14 LAB — PREPARE RBC (CROSSMATCH)

## 2014-03-14 LAB — HEMOGLOBIN AND HEMATOCRIT, BLOOD
HCT: 19.8 % — ABNORMAL LOW (ref 39.0–52.0)
Hemoglobin: 6.8 g/dL — CL (ref 13.0–17.0)

## 2014-03-14 LAB — MRSA PCR SCREENING: MRSA by PCR: NEGATIVE

## 2014-03-14 SURGERY — EGD (ESOPHAGOGASTRODUODENOSCOPY)
Anesthesia: Moderate Sedation

## 2014-03-14 MED ORDER — SODIUM CHLORIDE 0.9 % IV SOLN
INTRAVENOUS | Status: DC
  Administered 2014-03-14 (×2): via INTRAVENOUS
  Administered 2014-03-15: 1000 mL via INTRAVENOUS

## 2014-03-14 MED ORDER — SUCRALFATE 1 GM/10ML PO SUSP
1.0000 g | Freq: Three times a day (TID) | ORAL | Status: DC
  Administered 2014-03-14 – 2014-03-15 (×4): 1 g via ORAL
  Filled 2014-03-14 (×4): qty 10

## 2014-03-14 MED ORDER — MORPHINE SULFATE 2 MG/ML IJ SOLN: 2.0000 mg | INTRAMUSCULAR | Status: DC | PRN

## 2014-03-14 MED ORDER — ONDANSETRON HCL 4 MG/2ML IJ SOLN: 4.0000 mg | Freq: Four times a day (QID) | INTRAMUSCULAR | Status: DC | PRN

## 2014-03-14 MED ORDER — ONDANSETRON HCL 4 MG PO TABS: 4.0000 mg | ORAL_TABLET | Freq: Four times a day (QID) | ORAL | Status: DC | PRN

## 2014-03-14 MED ORDER — ONDANSETRON HCL 4 MG/2ML IJ SOLN
4.0000 mg | Freq: Once | INTRAMUSCULAR | Status: AC
  Administered 2014-03-14: 4 mg via INTRAVENOUS
  Filled 2014-03-14: qty 2

## 2014-03-14 MED ORDER — MIDAZOLAM HCL 5 MG/5ML IJ SOLN
INTRAMUSCULAR | Status: AC
  Filled 2014-03-14: qty 10

## 2014-03-14 MED ORDER — BUTAMBEN-TETRACAINE-BENZOCAINE 2-2-14 % EX AERO
INHALATION_SPRAY | CUTANEOUS | Status: DC | PRN
  Administered 2014-03-14: 2 via TOPICAL

## 2014-03-14 MED ORDER — MIDAZOLAM HCL 5 MG/5ML IJ SOLN
INTRAMUSCULAR | Status: DC | PRN
  Administered 2014-03-14 (×2): 1 mg via INTRAVENOUS
  Administered 2014-03-14: 0.5 mg via INTRAVENOUS

## 2014-03-14 MED ORDER — METOCLOPRAMIDE HCL 5 MG/ML IJ SOLN
10.0000 mg | Freq: Once | INTRAMUSCULAR | Status: AC
  Administered 2014-03-14: 10 mg via INTRAVENOUS
  Filled 2014-03-14: qty 2

## 2014-03-14 MED ORDER — METOCLOPRAMIDE HCL 5 MG/ML IJ SOLN
5.0000 mg | Freq: Four times a day (QID) | INTRAMUSCULAR | Status: DC
  Administered 2014-03-14 – 2014-03-15 (×4): 5 mg via INTRAVENOUS
  Filled 2014-03-14 (×4): qty 2

## 2014-03-14 MED ORDER — SODIUM CHLORIDE 0.9 % IV SOLN
80.0000 mg | Freq: Once | INTRAVENOUS | Status: AC
  Administered 2014-03-14: 80 mg via INTRAVENOUS
  Filled 2014-03-14: qty 80

## 2014-03-14 MED ORDER — MEPERIDINE HCL 50 MG/ML IJ SOLN
INTRAMUSCULAR | Status: AC
  Filled 2014-03-14: qty 1

## 2014-03-14 MED ORDER — SODIUM CHLORIDE 0.9 % IV SOLN: 10.0000 mL/h | Freq: Once | INTRAVENOUS | Status: DC

## 2014-03-14 MED ORDER — STERILE WATER FOR IRRIGATION IR SOLN
Status: DC | PRN
  Administered 2014-03-14: 16:00:00

## 2014-03-14 MED ORDER — SODIUM CHLORIDE 0.9 % IV SOLN
Freq: Once | INTRAVENOUS | Status: AC
  Administered 2014-03-15: 250 mL via INTRAVENOUS

## 2014-03-14 MED ORDER — SODIUM CHLORIDE 0.9 % IV BOLUS (SEPSIS)
1000.0000 mL | Freq: Once | INTRAVENOUS | Status: AC
  Administered 2014-03-14: 1000 mL via INTRAVENOUS

## 2014-03-14 MED ORDER — SODIUM CHLORIDE 0.9 % IV SOLN
8.0000 mg/h | INTRAVENOUS | Status: DC
  Administered 2014-03-14 (×2): 8 mg/h via INTRAVENOUS
  Filled 2014-03-14 (×6): qty 80

## 2014-03-14 MED ORDER — SODIUM CHLORIDE 0.9 % IJ SOLN
3.0000 mL | Freq: Two times a day (BID) | INTRAMUSCULAR | Status: DC
  Administered 2014-03-14 – 2014-03-15 (×3): 3 mL via INTRAVENOUS

## 2014-03-14 NOTE — Telephone Encounter (Signed)
LAST NIGHT AFTER SUPPER PT. VOMITED PINK LIQUID WHICH LOOKED AND TASTED LIKE THE TOMATO SAUCE HE HAD FOR SUPPER. LATER IN THE NIGHT PT.'S WIFE CALLED PT.'S PRIMARY CARE PHYSICIAN. SHE TOLD HIM THAT THE PT. HAD VOMITED, HIS VITAL SIGNS WERE NORMAL FOR THE PT. AND  HE WAS ASLEEP. THE PHYSICIAN INSTRUCTED TO LET PT. SLEEP. AT 5:30AM PT. VOMITED A QUART OF BROWN LIQUID. HIS VITAL SIGNS WERE B/P 85/64 AND PULSE 100. PT. IS VERY WEAK. THEY WERE DRIVING TO [place] WHEN PT.'S WIFE HAD TO STOP THE CAR AND PT. VOMITED BLACK LIQUID. VERBAL ORDER AND READ BACK TO DR.SHADAD- PT. NEEDS TO GO TO THE EMERGENCY DEPARTMENT. NOTIFIED PT.'S WIFE OF THE ABOVE INSTRUCTIONS. PT.'S WIFE WILL TAKE HER HUSBAND TO THE [facility] [place] SINCE IT IS CLOSER.

## 2014-03-14 NOTE — H&P (Signed)
[facility] History and Physical  [name] [name] EHM:[id number] DOB: 06-Jan-1941 DOA: 03/14/2014  Referring physician: Emergency Department PCP: [name] Percy, MD  Specialists:   Chief Complaint: GI bleed  HPI: [name] [name] is a 72 y.o. male  With a hx of HTN, HLD, prostate ca who presents with acute onset and multiple bouts of emesis that started one day prior to admission. Pt is currently taking aspirin chronically. Reports having coffee appearing emesis starting about 10pm the night prior to admit with recurrent episodes the morning of ED visit. Denies abd pain. Denies BRBPR or black stools. Denies near syncope but does feel more weak.  Review of Systems:   Per above, the remainder of the 10 pt ros reviewed and are neg  Past Medical History  Diagnosis Date  . Hypertension   . Hyperlipidemia   . Prostate carcinoma   . 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 BURNEY  . Anxiety   . GERD (gastroesophageal reflux disease)   . Tachycardia     history of  . Fatigue    Past Surgical History  Procedure Laterality Date  . Resection bone tumor rib  [id number]    RT VAT , RESECTION OF 3rd,4th ,5th ribs with reconstruction with allograft and titanium platesDR. BURNEY  . Dental surgery     Social History:  reports that he quit smoking about 39 years ago. His smoking use included Cigarettes. He has a 30 pack-year smoking history. He does not have any smokeless tobacco history on file. He reports that he does not drink alcohol or use illicit drugs.  where does patient live--home, ALF, SNF? and with whom if at home?  Can patient participate in ADLs?  Allergies  Allergen Reactions  . Oxycodone Nausea And Vomiting  . Penicillins Itching and Rash    Family History  Problem Relation Age of Onset  . Aneurysm Father     (be sure to complete)  Prior to Admission medications   Medication  Sig Start Date End Date Taking? Authorizing Provider  acetaminophen (TYLENOL) 325 MG tablet Take 650 mg by mouth every 6 (six) hours as needed.    Historical Provider, MD  aspirin EC 81 MG tablet Take 81 mg by mouth at bedtime.    Historical Provider, MD  Calcium Citrate-Vitamin D (CITRACAL + D PO) Take 3 tablets by mouth every morning.    Historical Provider, MD  guaiFENesin (MUCINEX) 600 MG 12 hr tablet Take 600 mg by mouth 2 (two) times daily.    Historical Provider, MD  mirtazapine (REMERON) 15 MG tablet Take 15 mg by mouth at bedtime.    Historical Provider, MD  prochlorperazine (COMPAZINE) 10 MG tablet Take 1 tablet (10 mg total) by mouth every 6 (six) hours as needed for nausea or vomiting. 09/20/13   [name] Portela, MD  simvastatin (ZOCOR) 80 MG tablet Take 80 mg by mouth at bedtime.      Historical Provider, MD   Physical Exam: Filed Vitals:   03/14/14 0852 03/14/14 0900 03/14/14 0930 03/14/14 1000  BP:  85/57 85/61 79/57   Pulse:  110 110   Temp: 97.4 F (36.3 C)     TempSrc: Rectal     Resp:  20 21 18   SpO2:  99% 97%      General:  Awake, in nad  Eyes: PERRL B,   ENT: membranes moist, dentition fair  Neck: trachea midline,  neck supple  Cardiovascular: regular, s1, s2  Respiratory: normal resp effort, no wheezing  Abdomen: soft,nondistended  Skin: normal skin turgor, no abnormal skin lesions seen  Musculoskeletal: perfused, no clubbing  Psychiatric: mood/affect normal// no auditory/visual hallucinations  Neurologic: cn2-12 grossly intact, strength/sensation intact  Labs on Admission:  Basic Metabolic Panel:  Recent Labs Lab 03/14/14 0904  NA 138  K 4.4  CL 99  CO2 25  GLUCOSE 161*  BUN 50*  CREATININE 0.83  CALCIUM 8.6   Liver Function Tests:  Recent Labs Lab 03/14/14 0904  AST 18  ALT 9  ALKPHOS 106  BILITOT 0.4  PROT 5.4*  ALBUMIN 2.7*   No results for input(s): LIPASE, AMYLASE in the last 168 hours. No results for input(s): AMMONIA in  the last 168 hours. CBC:  Recent Labs Lab 03/14/14 0904  WBC 33.8*  NEUTROABS PENDING  HGB 7.6*  HCT 23.4*  MCV 94.0  PLT 292   Cardiac Enzymes: No results for input(s): CKTOTAL, CKMB, CKMBINDEX, TROPONINI in the last 168 hours.  BNP (last 3 results) No results for input(s): PROBNP in the last 8760 hours. CBG: No results for input(s): GLUCAP in the last 168 hours.  Radiological Exams on Admission: No results found.   Assessment/Plan Principal Problem:   Upper GI bleed Active Problems:   Prostate carcinoma   Hyperlipidemia   Hypertension   Metastatic bone tumor   Acute blood loss anemia   1. Acute blood loss anemia secondary to UGI bleed 1. Nearly 4gm drop in hgb in over one week 2. One unit of prbc's ordered in ED 3. Follow serial hgb and tx as needed 4. GI consulted through the ED 5. Cont PPI gtt 6. Keep NPO for now 7. Hold antiplatelet 8. Given hypotension, will admit to stepdown 2. Prostate Cancer with Bone Mets 1. Noted to be Stage 4 3. HLD 1. Would cont on statin when resuming PO intake 4. HTN 1. Hypotensive in ED 2. Would cont on IVF for now 5. DVT prophylaixs 1. SCD's  Code Status: Full Family Communication: Pt in room  Disposition Plan: Pending   Time spent: 45min  [name] [name], [facility] Pager [phone]  If 7PM-7AM, please contact night-coverage [email or web address] Password [facility] 03/14/2014, 10:22 AM

## 2014-03-14 NOTE — Progress Notes (Addendum)
Subjective: Since I last evaluated the patient  Admitted thru the ED today. He states he vomited orangebrownish vomitus yesterday. He says he vomited a couple of times yesterday and once today. He denies prior hx of vomiting blood. He says his bowel movements have been brown. He denies stools being black. He takes ASA 81mg  daily. He denies taking NSAIDS. Hx of prostate cancer with metastasis and presently taking chemotherapy in [facility] Dr. Alen Blew. He does c/o weakness.   Objective: Vital signs in last 24 hours: Temp:  [97.4 F (36.3 C)-97.6 F (36.4 C)] 97.6 F (36.4 C) (11/25 1109) Pulse Rate:  [110-123] 114 (11/25 1109) Resp:  [18-24] 24 (11/25 1030) BP: (78-115)/(56-70) 115/70 mmHg (11/25 1109) SpO2:  [96 %-100 %] 99 % (11/25 1109) Weight:  [135 lb 2.3 oz (61.3 kg)] 135 lb 2.3 oz (61.3 kg) (11/25 1109)    Intake/Output from previous day:   Intake/Output this shift:    CBC Latest Ref Rng 03/14/2014 03/14/2014 03/01/2014  WBC 4.0 - 10.5 K/uL - 33.8(H) 7.2  Hemoglobin 13.0 - 17.0 g/dL 7.1(L) 7.6(L) 11.6(L)  Hematocrit 39.0 - 52.0 % - 23.4(L) 35.4(L)  Platelets 150 - 400 K/uL - 292 312      General appearance: alert   Alert and oriented. Skin warm and dry. Oral mucosa is moist.  Color is pale  . Sclera anicteric, conjunctivae is pink. Thyroid not enlarged. No cervical lymphadenopathy. Lungs clear. Heart regular rate and rhythm.  Abdomen is soft. Bowel sounds are positive. No hepatomegaly. No abdominal masses felt. No tenderness.  No edema to lower extremities.    Lab Results:  Recent Labs  03/14/14 0904 03/14/14 1035  WBC 33.8*  --   HGB 7.6* 7.1*  HCT 23.4*  --   PLT 292  --    BMET  Recent Labs  03/14/14 0904  NA 138  K 4.4  CL 99  CO2 25  GLUCOSE 161*  BUN 50*  CREATININE 0.83  CALCIUM 8.6   LFT  Recent Labs  03/14/14 0904  PROT 5.4*  ALBUMIN 2.7*  AST 18  ALT 9  ALKPHOS 106  BILITOT 0.4   PT/INR  Recent Labs  03/14/14 1035   LABPROT 15.2  INR 1.18   Hepatitis Panel No results for input(s): HEPBSAG, HCVAB, HEPAIGM, HEPBIGM in the last 72 hours. C-Diff No results for input(s): CDIFFTOX in the last 72 hours. No results for input(s): CDIFFPCR in the last 72 hours. Fecal Lactopherrin No results for input(s): FECLLACTOFRN in the last 72 hours.  Studies/Results: No results found.  Medications: I have reviewed the patient's current medications.  Assessment/Plan: UGI bleed. PUD needs to be ruled out. Agree with Protonix IV, Agree with one unit of PRBCs.  Will discuss with Dr. Laural Golden. EGD.  LOS: 0 days   SETZER,TERRI W 03/14/2014, 12:05 PM  GI attending note; Patient interviewed and examined. Records reviewed. Patient is 73 year old Caucasian male with history of metastatic prostate carcinoma who is receiving chemotherapy every 3 weeks and now presents with upper GI bleed. He is on low-dose aspirin. No history of peptic ulcer disease or chronic heartburn. He has received one unit of PRBCs. Posttransfusion H&H is pending. Abdominal exam is within normal limits. Patient at risk for peptic ulcer disease as well as reflux esophagitis but also be concerned about metastatic disease. Patient is agreeable to proceed with EGD.

## 2014-03-14 NOTE — ED Notes (Signed)
Report given to [facility] for ICU-01

## 2014-03-14 NOTE — Care Management Utilization Note (Signed)
UR review complete.  

## 2014-03-14 NOTE — Progress Notes (Signed)
Notified Dr. Gala Romney and Hospitalist Dr. Levi Aland of Post transfusion Hgb of 7.1. No change from previous.  Order to recheck at 2300.  If 6.9 will transfuse remaining unit. Patient is alert and oriented, pleasant man, resting at present. Family went home for the night.

## 2014-03-14 NOTE — ED Notes (Signed)
Pt states the he has had several episodes of diarrhea this morning and vomited up "what looked like blood". Pt states he has generalized weakness with no specific pain sites.  Pt is in NAD distress. Alert and Oriented x4 with spouse at bedside.

## 2014-03-14 NOTE — ED Provider Notes (Signed)
CSN: [id number]     Arrival date & time 03/14/14  9735 History  This chart was scribed for Virgel Manifold, MD by Zola Button, ED Scribe. This patient was seen in room APA11/APA11 and the patient's care was started at 9:20 AM.     Chief Complaint  Patient presents with  . Hematemesis    Patient is a 72 y.o. male presenting with vomiting. The history is provided by the patient and the spouse. No language interpreter was used.  Emesis Duration:  12 hours Timing:  Intermittent Number of daily episodes:  3 Progression:  Unchanged Chronicity:  New Associated symptoms: no abdominal pain    HPI Comments: [name] [name] is a 73 y.o. male with a hx of prostate carcinoma and hiatal hernia who presents to the Emergency Department complaining of 3 episodes of hematemesis since last night; patient has been vomiting darkly colored emesis believed to be blood.  He also reports several episodes of diarrhea this morning as well as generalized weakness. Per spouse, patient has been doing well with chemotherapy for prostate carcinoma, but states that the patient's BP has been lower and has occasional nausea. She states that the patient ate well last night, but started feeling nauseous shortly afterwards so he took a nausea pill. He soon started feeling woozy; his spouse measured his BP to be 78/45. Then, he vomited large amounts of rust colored emesis onto the floor; however, he did have tomatoes in his meal and he states it tasted somewhat like tomato while vomiting. Patient was noted to be restless throughout the night. He then had another episode of vomiting around 5am this morning with emesis that was described as looking like "chocolate." His BP was low, but normal again on recheck. He was on the way to [facility] with his wife later on this morning; however, when they stopped at a [employer] en route, he vomited again next to the car with darkly colored emesis. Patient states he has had generalized weakness  the whole time. His spouse states that he is normally pale, but he has been paler than baseline. He denies any pain or discolored stools. Patient takes baby aspirin, but no other blood thinners. He denies hx of GI bleed or ulcers. Patient does not have a GI doctor and has never had an endoscopy done. Patient states that his hiatal hernia has not bothered him in years.  Oncologist: Dr. Alen Blew PCP: Dr. Nadara Mustard  Past Medical History  Diagnosis Date  . Hypertension   . Hyperlipidemia   . Prostate carcinoma   . 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 BURNEY  . Anxiety   . GERD (gastroesophageal reflux disease)   . Tachycardia     history of  . Fatigue    Past Surgical History  Procedure Laterality Date  . Resection bone tumor rib  [id number]    RT VAT , RESECTION OF 3rd,4th ,5th ribs with reconstruction with allograft and titanium platesDR. BURNEY  . Dental surgery     Family History  Problem Relation Age of Onset  . Aneurysm Father    History  Substance Use Topics  . Smoking status: Former Smoker -- 2.00 packs/day for 15 years    Types: Cigarettes    Quit date: 04/20/1974  . Smokeless tobacco: Not on file  . Alcohol Use: No    Review of Systems  Respiratory: Positive for cough.   Cardiovascular:  Negative for chest pain.  Gastrointestinal: Positive for nausea and vomiting. Negative for abdominal pain.  Skin: Positive for pallor.  All other systems reviewed and are negative.     Allergies  Oxycodone and Penicillins  Home Medications   Prior to Admission medications   Medication Sig Start Date End Date Taking? Authorizing Provider  acetaminophen (TYLENOL) 325 MG tablet Take 650 mg by mouth every 6 (six) hours as needed.    Historical Provider, MD  aspirin EC 81 MG tablet Take 81 mg by mouth at bedtime.    Historical Provider, MD  Calcium Citrate-Vitamin D (CITRACAL + D PO) Take 3  tablets by mouth every morning.    Historical Provider, MD  guaiFENesin (MUCINEX) 600 MG 12 hr tablet Take 600 mg by mouth 2 (two) times daily.    Historical Provider, MD  mirtazapine (REMERON) 15 MG tablet Take 15 mg by mouth at bedtime.    Historical Provider, MD  prochlorperazine (COMPAZINE) 10 MG tablet Take 1 tablet (10 mg total) by mouth every 6 (six) hours as needed for nausea or vomiting. 09/20/13   Wyatt Portela, MD  simvastatin (ZOCOR) 80 MG tablet Take 80 mg by mouth at bedtime.      Historical Provider, MD   BP 85/65 mmHg  Pulse 123  Temp(Src) 97.4 F (36.3 C) (Rectal)  Resp 19  SpO2 100% Physical Exam  Constitutional: He is oriented to person, place, and time. He appears well-developed and well-nourished.  Pale, but non toxic appearing.  HENT:  Head: Normocephalic and atraumatic.  Brown material on lips.  Eyes: Conjunctivae and EOM are normal. Pupils are equal, round, and reactive to light.  Neck: Normal range of motion. Neck supple.  Cardiovascular: Regular rhythm.  Tachycardia present.   Pulmonary/Chest: Effort normal and breath sounds normal.  Abdominal: Soft. Bowel sounds are normal.  Musculoskeletal: Normal range of motion.  Neurological: He is alert and oriented to person, place, and time.  Skin: Skin is warm and dry.  Psychiatric: He has a normal mood and affect. His behavior is normal.  Nursing note and vitals reviewed.   ED Course  Procedures   CRITICAL CARE Performed by: Virgel Manifold   Total critical care time: 35 minutes  Critical care time was exclusive of separately billable procedures and treating other patients. Critical care was necessary to treat or prevent imminent or life-threatening deterioration. Critical care was time spent personally by me on the following activities: development of treatment plan with patient and/or surrogate as well as nursing, discussions with consultants, evaluation of patient's response to treatment, examination of  patient, obtaining history from patient or surrogate, ordering and performing treatments and interventions, ordering and review of laboratory studies, ordering and review of radiographic studies, pulse oximetry and re-evaluation of patient's condition.  DIAGNOSTIC STUDIES: Oxygen Saturation is 100% on room air, normal by my interpretation.    COORDINATION OF CARE: 9:30 AM-Discussed treatment plan which includes medications with pt at bedside and pt agreed to plan.    Labs Review Labs Reviewed  CBC WITH DIFFERENTIAL - Abnormal; Notable for the following:    WBC 33.8 (*)    RBC 2.49 (*)    Hemoglobin 7.6 (*)    HCT 23.4 (*)    RDW 15.7 (*)    Neutrophils Relative % 87 (*)    Lymphocytes Relative 9 (*)    All other components within normal limits  COMPREHENSIVE METABOLIC PANEL - Abnormal; Notable for the following:    Glucose, Bld 161 (*)  BUN 50 (*)    Total Protein 5.4 (*)    Albumin 2.7 (*)    GFR calc non Af Amer 86 (*)    All other components within normal limits  POC OCCULT BLOOD, ED - Abnormal; Notable for the following:    Fecal Occult Bld POSITIVE (*)    All other components within normal limits  TYPE AND SCREEN  PREPARE RBC (CROSSMATCH)    Imaging Review No results found.   EKG Interpretation None       EKG:  Rhythm: sinus tachycardia Rate: 125 Axis: right PR: 138 ms QRS: 155ms QTc: 486 ms RBBB ST segments: NS ST changes   MDM   Final diagnoses:  Upper GI bleed  Symptomatic anemia    73yM with hematemesis. 81mg  asa daily, otherwise no blood thinners.  Denies BRBPR or melena, but stool heme positive. Reports hx of hiatal hernia/GERD. Not on chronic PPI or H2 blocker. Denies hx of PUD, significant ETOH use or known liver dysfunction. Denies abdominal pain. Tachy/hypotensive. Wife insists he is normally in this range but, review of records shows that historically he runs a bit higher. Significant drop in H/H with hemoglobin of 7.6 from 11.6 two weeks  ago. BUN 50 with normal Cr. With BP in this range and symptomatic, will transfuse a unit PRBCs. Pantoprazole bolus/gtt. Never had endoscopy. Will discuss with gastroenterology and medicine. Will need admission.   Information relayed through nurse. Dr Laural Golden currently in middle of endoscopy.     Vitals - 1 value per visit 03/14/2014 03/01/2014 02/09/2014 91/91/6606  SYSTOLIC 85 004 599 774  DIASTOLIC 65 72 64 69  Pulse 123 90 92 93   Vitals - 1 value per visit 01/19/2014 01/18/2014 12/29/2013 1/42/3953  SYSTOLIC 202 334 356 861  DIASTOLIC 79 66 70 64  Pulse 96 94 75 87   Vitals - 1 value per visit 12/08/2013 12/07/2013 11/17/2013 6/83/7290  SYSTOLIC 211 155 208 97  DIASTOLIC 70 68 63 67  Pulse 89 91 93 88    I personally preformed the services scribed in my presence. The recorded information has been reviewed is accurate. Virgel Manifold, MD.    Virgel Manifold, MD 03/14/14 [phone]

## 2014-03-14 NOTE — ED Notes (Addendum)
Vomiting dark brown emesis times 3 since last night .  2 stools this morning that he states was normal.   Denies any pain.  C/o feeling weak.  Pt is a chemo pt.  And per wife his normal systolic b/p is in the 92'K.  Per wife he is paler than normal.

## 2014-03-14 NOTE — Op Note (Signed)
EGD PROCEDURE REPORT  PATIENT:  [name] [name]  MR#:  [id number] Birthdate:  1940-06-10, 73 y.o., male Endoscopist:  Dr. Rogene Houston, MD Referred By:  Dr.  Velvet Bathe, MD  Procedure Date: 03/14/2014  Procedure:   EGD  Indications: Patient is 73 year old Caucasian male with history of metastatic prostate carcinoma who is undergoing chemotherapy every 3 weeks now presents with upper GI bleed and anemia. Hemoglobin has dropped from 11.6 g 2 weeks ago to 7.6. Platelet count and INR are normal. He has cough of several weeks duration but he denies heartburn dysphagia regurgitation or abdominal pain.            Informed Consent:  The risks, benefits, alternatives & imponderables which include, but are not limited to, bleeding, infection, perforation, drug reaction and potential missed lesion have been reviewed.  The potential for biopsy, lesion removal, esophageal dilation, etc. have also been discussed.  Questions have been answered.  All parties agreeable.  Please see history & physical in medical record for more information.  Medications:   Versed 3 mg IV Cetacaine spray topically for oropharyngeal anesthesia  Description of procedure:  The endoscope was introduced through the mouth and advanced to the second portion of the duodenum without difficulty or limitations. The mucosal surfaces were surveyed very carefully during advancement of the scope and upon withdrawal.  Findings:  Esophagus:  Mucosa of the proximal segment was normal. Tubular Barrett's mucosa noted with proximal margin are junction at 30 cm from the incisors. Multiple ulcers noted involving better segment. Of these 2 were large ulcers distal esophagus. One ulcer was covered with brownish pigment but no active bleeding noted. GEJ:  35 cm Hiatus:  40 cm Stomach:  Stomach was empty and distended very well with insufflation. Folds in the proximal stomach were normal. Examination of mucosa at gastric body, antrum, pyloric  channel, angularis fundus and cardia was normal. Duodenum:  Normal bulbar and post bulbar mucosa.  Therapeutic/Diagnostic Maneuvers Performed:  None  Complications:  None  Impression: 5 cm long tubular Barrett's segment with multiple benign appearing ulcers felt to be source of patient's upper GI bleed. No active bleeding noted. Moderate-sized sliding hiatal hernia.  Recommendations:  Continue IV pantoprazole for another 24 hours. Sucralfate 1 g by mouth before meals and daily at bedtime. Metoclopramide 5 mg IV every 6 hours while patient is hospitalized. Full liquid diet. Keep HOB at 30 at all times. No aspirin for 2 weeks.  Mareli Antunes U  03/14/2014  4:21 PM  CC: Dr. Rory Percy, MD & Dr. Rayne Du ref. provider found CC;  Dr. Mathis Dad. Alen Blew, MD

## 2014-03-15 LAB — COMPREHENSIVE METABOLIC PANEL
ALT: 8 U/L (ref 0–53)
ANION GAP: 10 (ref 5–15)
AST: 16 U/L (ref 0–37)
Albumin: 2.4 g/dL — ABNORMAL LOW (ref 3.5–5.2)
Alkaline Phosphatase: 82 U/L (ref 39–117)
BUN: 30 mg/dL — ABNORMAL HIGH (ref 6–23)
CALCIUM: 8.1 mg/dL — AB (ref 8.4–10.5)
CO2: 23 meq/L (ref 19–32)
CREATININE: 0.69 mg/dL (ref 0.50–1.35)
Chloride: 107 mEq/L (ref 96–112)
GFR calc Af Amer: >90 mL/min (ref >90)
Glucose, Bld: 122 mg/dL — ABNORMAL HIGH (ref 70–99)
Potassium: 3.5 mEq/L — ABNORMAL LOW (ref 3.7–5.3)
Sodium: 140 mEq/L (ref 137–147)
Total Bilirubin: 0.6 mg/dL (ref 0.3–1.2)
Total Protein: 4.7 g/dL — ABNORMAL LOW (ref 6.0–8.3)

## 2014-03-15 LAB — CBC
HCT: 23.5 % — ABNORMAL LOW (ref 39.0–52.0)
Hemoglobin: 8.1 g/dL — ABNORMAL LOW (ref 13.0–17.0)
MCH: 30.7 pg (ref 26.0–34.0)
MCHC: 34.5 g/dL (ref 30.0–36.0)
MCV: 89 fL (ref 78.0–100.0)
PLATELETS: 174 10*3/uL (ref 150–400)
RBC: 2.64 MIL/uL — AB (ref 4.22–5.81)
RDW: 16.1 % — ABNORMAL HIGH (ref 11.5–15.5)
WBC: 20 10*3/uL — AB (ref 4.0–10.5)

## 2014-03-15 LAB — HEMOGLOBIN AND HEMATOCRIT, BLOOD
HEMATOCRIT: 28.6 % — AB (ref 39.0–52.0)
Hemoglobin: 9.9 g/dL — ABNORMAL LOW (ref 13.0–17.0)

## 2014-03-15 LAB — PREPARE RBC (CROSSMATCH)

## 2014-03-15 MED ORDER — PANTOPRAZOLE SODIUM 40 MG PO TBEC: 40.0000 mg | DELAYED_RELEASE_TABLET | Freq: Two times a day (BID) | ORAL | Status: DC

## 2014-03-15 MED ORDER — SUCRALFATE 1 GM/10ML PO SUSP: 1.0000 g | Freq: Three times a day (TID) | ORAL | Status: DC

## 2014-03-15 MED ORDER — POTASSIUM CHLORIDE CRYS ER 20 MEQ PO TBCR
40.0000 meq | EXTENDED_RELEASE_TABLET | Freq: Once | ORAL | Status: AC
  Administered 2014-03-15: 40 meq via ORAL
  Filled 2014-03-15: qty 2

## 2014-03-15 MED ORDER — FUROSEMIDE 10 MG/ML IJ SOLN
20.0000 mg | Freq: Once | INTRAMUSCULAR | Status: AC
  Administered 2014-03-15: 20 mg via INTRAVENOUS
  Filled 2014-03-15: qty 2

## 2014-03-15 MED ORDER — DIPHENHYDRAMINE HCL 25 MG PO CAPS
25.0000 mg | ORAL_CAPSULE | Freq: Once | ORAL | Status: AC
  Administered 2014-03-15: 25 mg via ORAL
  Filled 2014-03-15: qty 1

## 2014-03-15 MED ORDER — ACETAMINOPHEN 325 MG PO TABS
650.0000 mg | ORAL_TABLET | Freq: Once | ORAL | Status: AC
  Administered 2014-03-15: 650 mg via ORAL
  Filled 2014-03-15: qty 2

## 2014-03-15 MED ORDER — SODIUM CHLORIDE 0.9 % IV SOLN
Freq: Once | INTRAVENOUS | Status: AC
  Administered 2014-03-15: 10:00:00 via INTRAVENOUS

## 2014-03-15 NOTE — Discharge Summary (Signed)
Physician Discharge Summary  [name] [name] IRC:[id number] DOB: 1940-05-22 DOA: 03/14/2014  PCP: Rory Percy, MD  Admit date: 03/14/2014 Discharge date: 03/15/2014  Time spent: 35 minutes  Recommendations for Outpatient Follow-up:  1. Follow up with PCP in 1-2 weeks 2.  follow up with Dr. Laural Golden in 3-4 weeks  Discharge Diagnoses:  Principal Problem:   Upper GI bleed Active Problems:   Prostate carcinoma   Hyperlipidemia   Hypertension   Metastatic bone tumor   Acute blood loss anemia   Discharge Condition: Stable  Diet recommendation: heart healthy  Filed Weights   03/14/14 1109 03/15/14 0500  Weight: 61.3 kg (135 lb 2.3 oz) 60.1 kg (132 lb 7.9 oz)    History of present illness:  Please see admit h and p from 11/25 for details. Briefly, pt presents with profound anemia associated with hematemesis. Pt was admitted for further work up.  Hospital Course:  1. Acute blood loss anemia secondary to UGI bleed 1. Nearly 4gm drop in hgb in over one week 2. Total of 3 units of prbc's given to pt during this admission 3. Pt was seen by GI, who performed EDG revealing ulcerative esophagitis with 5cm long tubular barrett with extensive ulcerations from chronic GERD 4. GI recs for BID pantoprazole, Carafate on discharge 2. Prostate Cancer with Bone Mets 1. Noted to be Stage 4 3. HLD 1. Would cont on statin 4. HTN 1. Initially hypotensive in ED, but asymptomatic. Family reports this is near his baseline 2. Would cont on IVF for now 5. DVT prophylaixs 1. SCD's  Procedures:  EGD 11/25  Consultations:  GI  Discharge Exam: Filed Vitals:   03/15/14 0400 03/15/14 0500 03/15/14 0600 03/15/14 0821  BP:  104/66 86/54   Pulse: 95 92 83   Temp: 97.5 F (36.4 C)   97.1 F (36.2 C)  TempSrc: Oral   Oral  Resp: 24 21 15    Height:      Weight:  60.1 kg (132 lb 7.9 oz)    SpO2: 97% 99% 95%     General: Awake, in nad Cardiovascular: regular, s1, s2 Respiratory: normal  resp effort, no wheezing  Discharge Instructions     Medication List    TAKE these medications        acetaminophen 325 MG tablet  Commonly known as:  TYLENOL  Take 650 mg by mouth every 6 (six) hours as needed for moderate pain.     aspirin EC 81 MG tablet  Take 81 mg by mouth at bedtime.     CITRACAL + D PO  Take 3 tablets by mouth every morning.     loratadine 10 MG tablet  Commonly known as:  CLARITIN  Take 10 mg by mouth daily.     mirtazapine 15 MG tablet  Commonly known as:  REMERON  Take 15 mg by mouth at bedtime.     pantoprazole 40 MG tablet  Commonly known as:  PROTONIX  Take 1 tablet (40 mg total) by mouth 2 (two) times daily before a meal.     prochlorperazine 10 MG tablet  Commonly known as:  COMPAZINE  Take 1 tablet (10 mg total) by mouth every 6 (six) hours as needed for nausea or vomiting.     simvastatin 80 MG tablet  Commonly known as:  ZOCOR  Take 80 mg by mouth at bedtime.     sucralfate 1 GM/10ML suspension  Commonly known as:  CARAFATE  Take 10 mLs (1 g total) by  mouth 4 (four) times daily -  with meals and at bedtime.       Allergies  Allergen Reactions  . Oxycodone Nausea And Vomiting  . Penicillins Itching and Rash   Follow-up Information    Follow up with Rory Percy, MD. Schedule an appointment as soon as possible for a visit in 1 week.   Specialty:  Family Medicine   Contact information:   [place] [id number] [phone]       Follow up with Rogene Houston, MD.   Specialty:  Gastroenterology   Why:  in 3-4 weeks   Contact information:   [place], [address] [place] [place] [address] [phone]        The results of significant diagnostics from this hospitalization (including imaging, microbiology, ancillary and laboratory) are listed below for reference.    Significant Diagnostic Studies: Dg Chest 2 View  03/01/2014   CLINICAL DATA:  Cough. Shortness of breath. History prostate cancer.  EXAM: CHEST   2 VIEW  COMPARISON:  PET-CT 08/30/2013.  FINDINGS: Mediastinum hilar structures are normal. Heart size normal. Mild basilar atelectasis and/or scarring. Reference is made to prior PET-CT report 08/30/2013 which demonstrated small pulmonary nodules. Heart size normal. Destructive changes again noted of the lower thoracic vertebral bodies consistent with metastatic disease. Reference is made to PET-CT report which reveals multiple bony metastases. Surgical plates noted over the right chest.  IMPRESSION: 1. No acute cardiopulmonary disease. 2. Surgical plates right chest. Destructive changes lower thoracic vertebral bodies again noted consistent with metastatic disease. 3. Reference is made to PET-CT report 08/30/2013 which demonstrated multiple pulmonary nodules and multiple metastasis to the bony structures of the thorax.   Electronically Signed   By: Marcello Moores  Register   On: 03/01/2014 15:31    Microbiology: Recent Results (from the past 240 hour(s))  MRSA PCR Screening     Status: None   Collection Time: 03/14/14 11:45 AM  Result Value Ref Range Status   MRSA by PCR NEGATIVE NEGATIVE Final    Comment:        The GeneXpert MRSA Assay (FDA approved for NASAL specimens only), is one component of a comprehensive MRSA colonization surveillance program. It is not intended to diagnose MRSA infection nor to guide or monitor treatment for MRSA infections.      Labs: Basic Metabolic Panel:  Recent Labs Lab 03/14/14 0904 03/15/14 0553  NA 138 140  K 4.4 3.5*  CL 99 107  CO2 25 23  GLUCOSE 161* 122*  BUN 50* 30*  CREATININE 0.83 0.69  CALCIUM 8.6 8.1*   Liver Function Tests:  Recent Labs Lab 03/14/14 0904 03/15/14 0553  AST 18 16  ALT 9 8  ALKPHOS 106 82  BILITOT 0.4 0.6  PROT 5.4* 4.7*  ALBUMIN 2.7* 2.4*   No results for input(s): LIPASE, AMYLASE in the last 168 hours. No results for input(s): AMMONIA in the last 168 hours. CBC:  Recent Labs Lab 03/14/14 0904  03/14/14 1035 03/14/14 1832 03/14/14 2253 03/15/14 0553  WBC 33.8*  --   --   --  20.0*  NEUTROABS 29.4*  --   --   --   --   HGB 7.6* 7.1* 7.1* 6.8* 8.1*  HCT 23.4*  --   --  19.8* 23.5*  MCV 94.0  --   --   --  89.0  PLT 292  --   --   --  174   Cardiac Enzymes: No results  for input(s): CKTOTAL, CKMB, CKMBINDEX, TROPONINI in the last 168 hours. BNP: BNP (last 3 results) No results for input(s): PROBNP in the last 8760 hours. CBG: No results for input(s): GLUCAP in the last 168 hours.  Signed:  Tunisha Ruland K  [facility] 03/15/2014, 9:07 AM

## 2014-03-15 NOTE — Progress Notes (Signed)
  Subjective:  Patient feels better. He states cough has decreased. He denies heartburn nausea vomiting or hematemesis. He also denies abdominal pain. Today he tells me that he used to have heartburn a lot but not in the last few years. Yesterday he did not provide this information and neither did his wife.    Objective: Blood pressure 86/54, pulse 83, temperature 97.5 F (36.4 C), temperature source Oral, resp. rate 15, height 5\' 8"  (1.727 m), weight 132 lb 7.9 oz (60.1 kg), SpO2 95 %. Patient is alert.  He appears less pale than yesterday. Abdomen is soft and nontender without organomegaly or masses. No LE edema or clubbing noted.  Labs/studies Results:   Recent Labs  03/14/14 0904  03/14/14 1832 03/14/14 2253 03/15/14 0553  WBC 33.8*  --   --   --  20.0*  HGB 7.6*  < > 7.1* 6.8* 8.1*  HCT 23.4*  --   --  19.8* 23.5*  PLT 292  --   --   --  174  < > = values in this interval not displayed.  BMET   Recent Labs  03/14/14 0904 03/15/14 0553  NA 138 140  K 4.4 3.5*  CL 99 107  CO2 25 23  GLUCOSE 161* 122*  BUN 50* 30*  CREATININE 0.83 0.69  CALCIUM 8.6 8.1*    LFT   Recent Labs  03/14/14 0904 03/15/14 0553  PROT 5.4* 4.7*  ALBUMIN 2.7* 2.4*  AST 18 16  ALT 9 8  ALKPHOS 106 82  BILITOT 0.4 0.6    PT/INR   Recent Labs  03/14/14 1035  LABPROT 15.2  INR 1.18     Assessment:  #1. Upper GI bleed secondary to ulcerative esophagitis. Bleed inactive. Hemoglobin is still low despite 2 units of PRBCs. #2. 5 cm long tubular Barrett segment with extensive ulcerations secondary to chronic GERD. #3. Metastatic prostate carcinoma.  Recommendations;  Advance diet. Anti-reflux measures discussed with patient. Transfuse 1 more unit of PRBCs today. Pantoprazole changed to oral route. Metoclopramide can be discontinued at the time of discharge. Patient should be able to go home today from GI standpoint. Will arrange for office visit in 3-4 weeks.

## 2014-03-15 NOTE — Progress Notes (Signed)
[name] [name] is being discharged. Discharge instruction given and prescriptions given. Wife signed for husband. All iv lines removed intact covered with cotton ball and tape with no bleeding. Patient is awake and oriented at this time with no complaints.

## 2014-03-15 NOTE — Plan of Care (Signed)
Problem: Phase I Progression Outcomes Goal: Pain controlled with appropriate interventions Outcome: Progressing No complaints of pain Goal: OOB as tolerated unless otherwise ordered Outcome: Progressing Patient remains on bedrest with decreased hemoglobin Goal: Initial discharge plan identified Outcome: [place] with spouse Goal: Voiding-avoid urinary catheter unless indicated Outcome: Completed/Met Date Met:  03/15/14 Goal: Hemodynamically stable Outcome: Progressing BP remains soft, IVF running at 100cc/hr, 2nd unit of PRBC's being administered

## 2014-03-16 LAB — TYPE AND SCREEN
ABO/RH(D): O POS
Antibody Screen: NEGATIVE
Unit division: 0
Unit division: 0
Unit division: 0

## 2014-03-19 ENCOUNTER — Encounter (HOSPITAL_COMMUNITY): Payer: Self-pay | Admitting: Internal Medicine

## 2014-03-21 DIAGNOSIS — K922 Gastrointestinal hemorrhage, unspecified: Secondary | ICD-10-CM | POA: Diagnosis not present

## 2014-03-21 DIAGNOSIS — I959 Hypotension, unspecified: Secondary | ICD-10-CM | POA: Diagnosis not present

## 2014-03-22 ENCOUNTER — Ambulatory Visit (HOSPITAL_BASED_OUTPATIENT_CLINIC_OR_DEPARTMENT_OTHER): Payer: Medicare Other

## 2014-03-22 ENCOUNTER — Other Ambulatory Visit (HOSPITAL_BASED_OUTPATIENT_CLINIC_OR_DEPARTMENT_OTHER): Payer: Medicare Other

## 2014-03-22 ENCOUNTER — Ambulatory Visit (HOSPITAL_BASED_OUTPATIENT_CLINIC_OR_DEPARTMENT_OTHER): Payer: Medicare Other | Admitting: Physician Assistant

## 2014-03-22 ENCOUNTER — Ambulatory Visit: Payer: Medicare Other | Admitting: Neurology

## 2014-03-22 ENCOUNTER — Encounter: Payer: Self-pay | Admitting: Physician Assistant

## 2014-03-22 VITALS — BP 116/55 | HR 83 | Temp 97.8°F | Resp 18 | Ht 68.0 in | Wt 148.0 lb

## 2014-03-22 DIAGNOSIS — Z5111 Encounter for antineoplastic chemotherapy: Secondary | ICD-10-CM | POA: Diagnosis not present

## 2014-03-22 DIAGNOSIS — C7951 Secondary malignant neoplasm of bone: Secondary | ICD-10-CM

## 2014-03-22 DIAGNOSIS — R05 Cough: Secondary | ICD-10-CM

## 2014-03-22 DIAGNOSIS — C61 Malignant neoplasm of prostate: Secondary | ICD-10-CM

## 2014-03-22 LAB — CBC WITH DIFFERENTIAL/PLATELET
BASO%: 2.3 % — AB (ref 0.0–2.0)
BASOS ABS: 0.2 10*3/uL — AB (ref 0.0–0.1)
EOS%: 4.5 % (ref 0.0–7.0)
Eosinophils Absolute: 0.4 10*3/uL (ref 0.0–0.5)
HEMATOCRIT: 29.6 % — AB (ref 38.4–49.9)
HEMOGLOBIN: 9.5 g/dL — AB (ref 13.0–17.1)
LYMPH%: 11.5 % — ABNORMAL LOW (ref 14.0–49.0)
MCH: 29.9 pg (ref 27.2–33.4)
MCHC: 32.1 g/dL (ref 32.0–36.0)
MCV: 93 fL (ref 79.3–98.0)
MONO#: 0.9 10*3/uL (ref 0.1–0.9)
MONO%: 11.6 % (ref 0.0–14.0)
NEUT#: 5.6 10*3/uL (ref 1.5–6.5)
NEUT%: 70.1 % (ref 39.0–75.0)
Platelets: 363 10*3/uL (ref 140–400)
RBC: 3.18 10*6/uL — ABNORMAL LOW (ref 4.20–5.82)
RDW: 15.3 % — ABNORMAL HIGH (ref 11.0–14.6)
WBC: 8 10*3/uL (ref 4.0–10.3)
lymph#: 0.9 10*3/uL (ref 0.9–3.3)

## 2014-03-22 LAB — COMPREHENSIVE METABOLIC PANEL (CC13)
ALT: 14 U/L (ref 0–55)
AST: 23 U/L (ref 5–34)
Albumin: 2.9 g/dL — ABNORMAL LOW (ref 3.5–5.0)
Alkaline Phosphatase: 89 U/L (ref 40–150)
Anion Gap: 7 mEq/L (ref 3–11)
BUN: 11.4 mg/dL (ref 7.0–26.0)
CALCIUM: 8.7 mg/dL (ref 8.4–10.4)
CO2: 26 mEq/L (ref 22–29)
CREATININE: 0.7 mg/dL (ref 0.7–1.3)
Chloride: 107 mEq/L (ref 98–109)
EGFR: >90 mL/min/{1.73_m2} (ref >90)
Glucose: 89 mg/dl (ref 70–140)
Potassium: 3.7 mEq/L (ref 3.5–5.1)
Sodium: 140 mEq/L (ref 136–145)
Total Bilirubin: 0.36 mg/dL (ref 0.20–1.20)
Total Protein: 5.4 g/dL — ABNORMAL LOW (ref 6.4–8.3)

## 2014-03-22 MED ORDER — ONDANSETRON 8 MG/NS 50 ML IVPB
INTRAVENOUS | Status: AC
  Filled 2014-03-22: qty 8

## 2014-03-22 MED ORDER — DEXAMETHASONE SODIUM PHOSPHATE 10 MG/ML IJ SOLN
10.0000 mg | Freq: Once | INTRAMUSCULAR | Status: AC
  Administered 2014-03-22: 10 mg via INTRAVENOUS

## 2014-03-22 MED ORDER — ONDANSETRON 8 MG/50ML IVPB (CHCC)
8.0000 mg | Freq: Once | INTRAVENOUS | Status: AC
  Administered 2014-03-22: 8 mg via INTRAVENOUS

## 2014-03-22 MED ORDER — DEXAMETHASONE SODIUM PHOSPHATE 10 MG/ML IJ SOLN
INTRAMUSCULAR | Status: AC
  Filled 2014-03-22: qty 1

## 2014-03-22 MED ORDER — DOCETAXEL CHEMO INJECTION 160 MG/16ML
75.0000 mg/m2 | Freq: Once | INTRAVENOUS | Status: AC
  Administered 2014-03-22: 130 mg via INTRAVENOUS
  Filled 2014-03-22: qty 13

## 2014-03-22 MED ORDER — SODIUM CHLORIDE 0.9 % IV SOLN
Freq: Once | INTRAVENOUS | Status: AC
  Administered 2014-03-22: 13:00:00 via INTRAVENOUS

## 2014-03-22 NOTE — Patient Instructions (Signed)
[facility] Discharge Instructions for Patients Receiving Chemotherapy  Today you received the following chemotherapy agents Docetaxel.  To help prevent nausea and vomiting after your treatment, we encourage you to take your nausea medication as directed.    If you develop nausea and vomiting that is not controlled by your nausea medication, call the clinic.   BELOW ARE SYMPTOMS THAT SHOULD BE REPORTED IMMEDIATELY:  *FEVER GREATER THAN 100.5 F  *CHILLS WITH OR WITHOUT FEVER  NAUSEA AND VOMITING THAT IS NOT CONTROLLED WITH YOUR NAUSEA MEDICATION  *UNUSUAL SHORTNESS OF BREATH  *UNUSUAL BRUISING OR BLEEDING  TENDERNESS IN MOUTH AND THROAT WITH OR WITHOUT PRESENCE OF ULCERS  *URINARY PROBLEMS  *BOWEL PROBLEMS  UNUSUAL RASH Items with * indicate a potential emergency and should be followed up as soon as possible.  Feel free to call the clinic you have any questions or concerns. The clinic phone number is [phone].    

## 2014-03-22 NOTE — Progress Notes (Signed)
Hematology and Oncology Follow Up Visit  [name] [name] [id number] [birth year] 73 y.o. 03/22/2014 2:35 PM   Principle Diagnosis: 73 year old gentleman with castration resistant metastatic prostate cancer with disease to the bone. His initial diagnosis was in 2012 where he presented with metastatic disease to the rib. His PSA was 187.   Prior Therapy:  He was found to have a mass in the right axilla near the fourth rib encasing the right with some sclerosis. He underwent a right VATS procedure and resection of the third, fourth and fifth rib and reconstruction with titanium plates under the care of Dr. Arlyce Dice on November 13 2010.  He went on to receive androgen deprivation therapy under the care of Dr. Gaynelle Arabian. His PSA nadir was to 0.1 back in May of 2013 and subsequently started to rise.  He was treated with Provenge in the fall of 2014.  After his PSA went up to 178.6 and Xtandi was started in February of 2015 and discontinued in 09/2013 due to progression of disease and poor tolerance.  Current therapy: Taxotere chemotherapy started on 10/05/2013. He is here for cycle 9.  Interim History:  [name] [name] presents today for a followup visit.  Since his last visit, he was admitted to [facility] with coffee ground emesis and diagnosed with Barrett's esophagitis and "bad acid reflux. He underwent upper endoscopy and was found to have some ulcerations. He was placed on Protonix and Carafate with significant improvement in his symptoms. He was transfused 2 units of packed red blood cells during that admission. During follow-up with his primary care physician he was also placed on oral iron supplementation. He reports that he is had a persistent cough over the years and the gastroenterologist, Dr. Laural Golden, who did is procedure felt that his cough is likely related to his gastroesophageal reflux disease.  Since being placed on the Protonix and Carafate his cough has decreased. He tolerated the last  cycle of chemotherapy without any issues. He continues to have excellent clinical benefit from systemic chemotherapy.  He has been evaluated by his primary care physician and was started on allergy medication and decongestant. He did not report any nausea or vomiting. Did not report any peripheral neuropathy. Did not report any infusion-related complications. He does report some bone pain associated with Neulasta but otherwise no other complications. His appetite has improved. His pain has also improved dramatically as well.His mobility and performance status remains the same at this time. He does not report any headaches or blurry vision or double vision. Has not reported any syncope or change in his mental status. He does have memory issues and is currently under evaluation for that. He is not reporting any chest pain or shortness of breath.  Is that report any palpitation or leg edema. Does not report any frequency urgency or hesitancy. Does not report any skeletal complications. Does not report any lymphadenopathy or petechiae. Remainder of his review of systems unremarkable.  Medications: I have reviewed the patient's current medications.  Current Outpatient Prescriptions  Medication Sig Dispense Refill  . acetaminophen (TYLENOL) 325 MG tablet Take 650 mg by mouth every 6 (six) hours as needed for moderate pain.     Marland Kitchen aspirin EC 81 MG tablet Take 81 mg by mouth at bedtime.    . Calcium Citrate-Vitamin D (CITRACAL + D PO) Take 3 tablets by mouth every morning.    . pantoprazole (PROTONIX) 40 MG tablet Take 1 tablet (40 mg total) by mouth 2 (two) times  daily before a meal. 60 tablet 0  . simvastatin (ZOCOR) 80 MG tablet Take 80 mg by mouth at bedtime.      . sucralfate (CARAFATE) 1 GM/10ML suspension Take 10 mLs (1 g total) by mouth 4 (four) times daily -  with meals and at bedtime. 420 mL 0  . loratadine (CLARITIN) 10 MG tablet Take 10 mg by mouth daily.    . mirtazapine (REMERON) 15 MG tablet Take  15 mg by mouth at bedtime.    . prochlorperazine (COMPAZINE) 10 MG tablet Take 1 tablet (10 mg total) by mouth every 6 (six) hours as needed for nausea or vomiting. (Patient not taking: Reported on 03/22/2014) 30 tablet 0   No current facility-administered medications for this visit.   Facility-Administered Medications Ordered in Other Visits  Medication Dose Route Frequency Provider Last Rate Last Dose  . DOCEtaxel (TAXOTERE) 130 mg in dextrose 5 % 250 mL chemo infusion  75 mg/m2 (Treatment Plan Actual) Intravenous Once Wyatt Portela, MD 263 mL/hr at 03/22/14 1351 130 mg at 03/22/14 1351     Allergies:  Allergies  Allergen Reactions  . Oxycodone Nausea And Vomiting  . Penicillins Itching and Rash    Past Medical History, Surgical history, Social history, and Family History were reviewed and updated.   Physical Exam: Blood pressure 116/55, pulse 83, temperature 97.8 F (36.6 C), temperature source Oral, resp. rate 18, height 5\' 8"  (1.727 m), weight 148 lb (67.132 kg). ECOG:  1 General appearance: alert and cooperative Head: Normocephalic, without obvious abnormality Neck: no adenopathy Lymph nodes: Cervical, supraclavicular, and axillary nodes normal. Heart:regular rate and rhythm, S1, S2 normal, no murmur, click, rub or gallop Lung:chest clear, no wheezing, rales, normal symmetric air entry Abdomin: soft, non-tender, without masses or organomegaly EXT:no erythema, induration, or nodules.  Neurological: No deficits noted.  Lab Results: Lab Results  Component Value Date   WBC 8.0 03/22/2014   HGB 9.5* 03/22/2014   HCT 29.6* 03/22/2014   MCV 93.0 03/22/2014   PLT 363 03/22/2014     Chemistry      Component Value Date/Time   NA 140 03/22/2014 1114   NA 140 03/15/2014 0553   K 3.7 03/22/2014 1114   K 3.5* 03/15/2014 0553   CL 107 03/15/2014 0553   CO2 26 03/22/2014 1114   CO2 23 03/15/2014 0553   BUN 11.4 03/22/2014 1114   BUN 30* 03/15/2014 0553   CREATININE 0.7  03/22/2014 1114   CREATININE 0.69 03/15/2014 0553      Component Value Date/Time   CALCIUM 8.7 03/22/2014 1114   CALCIUM 8.1* 03/15/2014 0553   ALKPHOS 89 03/22/2014 1114   ALKPHOS 82 03/15/2014 0553   AST 23 03/22/2014 1114   AST 16 03/15/2014 0553   ALT 14 03/22/2014 1114   ALT 8 03/15/2014 0553   BILITOT 0.36 03/22/2014 1114   BILITOT 0.6 03/15/2014 0553       Results for LEONA, PRESSLY (MRN [id number]) as of 03/01/2014 09:06  Ref. Range 11/16/2013 11:44 12/07/2013 11:18 12/28/2013 11:38 01/18/2014 09:34 02/08/2014 09:31  PSA Latest Range: <=4.00 ng/mL 2.00 1.55 1.22 1.20 0.97      Impression and Plan:  73 year old gentleman with the following issues:  1. 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 He is currently receiving Taxotere chemotherapy and ready to proceed with  Cycle 9. He tolerated chemotherapy well so far with excellent response in his PSA. His PSA down to 0.97 from 292. The  plan is to continue with the current dose and schedule given his excellent response and excellent clinical benefit. His laboratory data from today are currently pending and has they are within normal range we'll proceed as scheduled.  2.Bone pain: Improved with systemic chemotherapy.  3. Recurrent cough: Likely related due to seasonal allergy but will obtain a chest x-ray to rule out pleural effusion which is a potential complication from Taxotere. CXR on 03/01/2014 was negative for acute cardiopulmonary disease. As stated above this cough likely related to GERD and his Barrett's Esophagitis- improving on Protonix and Carafate  4. Nausea prophylaxis: He has antiemetics prescribed and has not needed it at this time.  5. IV access: continue to discuss with him the role of Port-A-Cath and he would like to continue with peripheral veins for the time being. If his veins become an issue we will proceed with a Port-A-Cath insertion.  6. Neutropenia  prophylaxis: He will continue Neulasta after each chemotherapy cycle  7. Followup: Will be in 3 weeks before the next chemotherapy cycle.   Chene Kasinger E, PA-C  12/3/20152:35 PM

## 2014-03-22 NOTE — Patient Instructions (Signed)
Continue on your Protonix and Carafate as prescribed Follow up in 3 weeks

## 2014-03-23 ENCOUNTER — Ambulatory Visit (HOSPITAL_BASED_OUTPATIENT_CLINIC_OR_DEPARTMENT_OTHER): Payer: Medicare Other

## 2014-03-23 DIAGNOSIS — C61 Malignant neoplasm of prostate: Secondary | ICD-10-CM | POA: Diagnosis not present

## 2014-03-23 DIAGNOSIS — C7951 Secondary malignant neoplasm of bone: Secondary | ICD-10-CM

## 2014-03-23 LAB — PSA: PSA: 0.73 ng/mL (ref <=4.00)

## 2014-03-23 MED ORDER — PEGFILGRASTIM INJECTION 6 MG/0.6ML ~~LOC~~
6.0000 mg | PREFILLED_SYRINGE | Freq: Once | SUBCUTANEOUS | Status: AC
  Administered 2014-03-23: 6 mg via SUBCUTANEOUS
  Filled 2014-03-23: qty 0.6

## 2014-03-23 NOTE — Patient Instructions (Signed)
Pegfilgrastim injection What is this medicine? PEGFILGRASTIM (peg fil GRA stim) is a long-acting granulocyte colony-stimulating factor that stimulates the growth of neutrophils, a type of white blood cell important in the body's fight against infection. It is used to reduce the incidence of fever and infection in patients with certain types of cancer who are receiving chemotherapy that affects the bone marrow. This medicine may be used for other purposes; ask your health care provider or pharmacist if you have questions. COMMON BRAND NAME(S): Neulasta What should I tell my health care provider before I take this medicine? They need to know if you have any of these conditions: -latex allergy -ongoing radiation therapy -sickle cell disease -skin reactions to acrylic adhesives (On-Body Injector only) -an unusual or allergic reaction to pegfilgrastim, filgrastim, other medicines, foods, dyes, or preservatives -pregnant or trying to get pregnant -breast-feeding How should I use this medicine? This medicine is for injection under the skin. If you get this medicine at home, you will be taught how to prepare and give the pre-filled syringe or how to use the On-body Injector. Refer to the patient Instructions for Use for detailed instructions. Use exactly as directed. Take your medicine at regular intervals. Do not take your medicine more often than directed. It is important that you put your used needles and syringes in a special sharps container. Do not put them in a trash can. If you do not have a sharps container, call your pharmacist or healthcare provider to get one. Talk to your pediatrician regarding the use of this medicine in children. Special care may be needed. Overdosage: If you think you have taken too much of this medicine contact a poison control center or emergency room at once. NOTE: This medicine is only for you. Do not share this medicine with others. What if I miss a dose? It is  important not to miss your dose. Call your doctor or health care professional if you miss your dose. If you miss a dose due to an On-body Injector failure or leakage, a new dose should be administered as soon as possible using a single prefilled syringe for manual use. What may interact with this medicine? Interactions have not been studied. Give your health care provider a list of all the medicines, herbs, non-prescription drugs, or dietary supplements you use. Also tell them if you smoke, drink alcohol, or use illegal drugs. Some items may interact with your medicine. This list may not describe all possible interactions. Give your health care provider a list of all the medicines, herbs, non-prescription drugs, or dietary supplements you use. Also tell them if you smoke, drink alcohol, or use illegal drugs. Some items may interact with your medicine. What should I watch for while using this medicine? You may need blood work done while you are taking this medicine. If you are going to need a MRI, CT scan, or other procedure, tell your doctor that you are using this medicine (On-Body Injector only). What side effects may I notice from receiving this medicine? Side effects that you should report to your doctor or health care professional as soon as possible: -allergic reactions like skin rash, itching or hives, swelling of the face, lips, or tongue -dizziness -fever -pain, redness, or irritation at site where injected -pinpoint red spots on the skin -shortness of breath or breathing problems -stomach or side pain, or pain at the shoulder -swelling -tiredness -trouble passing urine Side effects that usually do not require medical attention (report to your doctor   or health care professional if they continue or are bothersome): -bone pain -muscle pain This list may not describe all possible side effects. Call your doctor for medical advice about side effects. You may report side effects to FDA at  1-800-FDA-1088. Where should I keep my medicine? Keep out of the reach of children. Store pre-filled syringes in a refrigerator between 2 and 8 degrees C (36 and 46 degrees F). Do not freeze. Keep in carton to protect from light. Throw away this medicine if it is left out of the refrigerator for more than 48 hours. Throw away any unused medicine after the expiration date. NOTE: This sheet is a summary. It may not cover all possible information. If you have questions about this medicine, talk to your doctor, pharmacist, or health care provider.  2015, [employer]. (2013-07-06 16:14:05)  

## 2014-03-27 DIAGNOSIS — I2510 Atherosclerotic heart disease of native coronary artery without angina pectoris: Secondary | ICD-10-CM | POA: Diagnosis not present

## 2014-03-27 DIAGNOSIS — R000 Tachycardia, unspecified: Secondary | ICD-10-CM | POA: Diagnosis not present

## 2014-03-29 ENCOUNTER — Ambulatory Visit (INDEPENDENT_AMBULATORY_CARE_PROVIDER_SITE_OTHER): Payer: Medicare Other | Admitting: Internal Medicine

## 2014-03-29 ENCOUNTER — Encounter (INDEPENDENT_AMBULATORY_CARE_PROVIDER_SITE_OTHER): Payer: Self-pay | Admitting: Internal Medicine

## 2014-03-29 VITALS — BP 98/50 | HR 68 | Temp 97.6°F | Ht 64.0 in | Wt 144.5 lb

## 2014-03-29 DIAGNOSIS — K254 Chronic or unspecified gastric ulcer with hemorrhage: Secondary | ICD-10-CM | POA: Diagnosis not present

## 2014-03-29 NOTE — Patient Instructions (Addendum)
CBC. OV in 3 months with a CBC

## 2014-03-29 NOTE — Progress Notes (Addendum)
Subjective:    Patient ID: [name] [name], male    DOB: Jul 30, 1940, 73 y.o.   MRN: [id number]  HPI  Patient is 73 year old Caucasian male with history of metastatic prostate carcinoma who is receiving chemotherapy every 3 weeks . Presents today for f/u after recent upper GI bleed.  He is on low-dose aspirin. No history of peptic ulcer disease or chronic heartburn. Admitted to [facility] 03/14/2014. He received two unit of PRBCs.while in the hospital.    He underwent an EGD (see below).  He is doing pretty good. There is no abdominal pain. There is no nausea or vomiting.  He denies any heartburn. He has had a cough which is much better per daughter.  Appetite is fair. He is having a BM daily. BMs are brown. Has not had melena since discharge. He is taking the Protonix BID and is taking Carafate qid.  He is sleeping a recliner to prevent refluxing.  Saw Dr. Nadara Mustard last week and was started on Iron last week. (Will get lab work from Dr. Nadara Mustard).   Procedure Date: 03/14/2014 EGD Indications: Patient is 73 year old Caucasian male with history of metastatic prostate carcinoma who is undergoing chemotherapy every 3 weeks now presents with upper GI bleed and anemia. Hemoglobin has dropped from 11.6 g 2 weeks ago to 7.6. Platelet count and INR are normal. He has cough of several weeks duration but he denies heartburn dysphagia regurgitation or abdominal pain.           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 No active bleeding noted. Moderate-sized sliding hiatal hernia.        CBC Latest Ref Rng 03/22/2014 03/15/2014 03/15/2014  WBC 4.0 - 10.3 10e3/uL 8.0 - 20.0(H)  Hemoglobin 13.0 - 17.1 g/dL 9.5(L) 9.9(L) 8.1(L)  Hematocrit 38.4 - 49.9 % 29.6(L) 28.6(L) 23.5(L)  Platelets 140 - 400 10e3/uL 363 - 174                                              Review of Systems Past Medical History  Diagnosis Date   . Hypertension   . Hyperlipidemia   . Prostate carcinoma   . 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 BURNEY  . Anxiety   . GERD (gastroesophageal reflux disease)   . Tachycardia     history of  . Fatigue     Past Surgical History  Procedure Laterality Date  . Resection bone tumor rib  [id number]    RT VAT , RESECTION OF 3rd,4th ,5th ribs with reconstruction with allograft and titanium platesDR. BURNEY  . Dental surgery    . Esophagogastroduodenoscopy N/A 03/14/2014    Procedure: ESOPHAGOGASTRODUODENOSCOPY (EGD);  Surgeon: Rogene Houston, MD;  Location: [facility];  Service: Endoscopy;  Laterality: N/A;    Allergies  Allergen Reactions  . Oxycodone Nausea And Vomiting  . Penicillins Itching and Rash    Current Outpatient Prescriptions on File Prior to Visit  Medication Sig Dispense Refill  . acetaminophen (TYLENOL) 325 MG tablet Take 650 mg by mouth every 6 (six) hours as needed for moderate pain.     Marland Kitchen aspirin EC 81 MG tablet Take 81 mg by mouth at bedtime.    Marland Kitchen  Calcium Citrate-Vitamin D (CITRACAL + D PO) Take 3 tablets by mouth every morning.    . loratadine (CLARITIN) 10 MG tablet Take 10 mg by mouth daily.    . mirtazapine (REMERON) 15 MG tablet Take 15 mg by mouth at bedtime.    . pantoprazole (PROTONIX) 40 MG tablet Take 1 tablet (40 mg total) by mouth 2 (two) times daily before a meal. 60 tablet 0  . prochlorperazine (COMPAZINE) 10 MG tablet Take 1 tablet (10 mg total) by mouth every 6 (six) hours as needed for nausea or vomiting. 30 tablet 0  . simvastatin (ZOCOR) 80 MG tablet Take 80 mg by mouth at bedtime.      . sucralfate (CARAFATE) 1 GM/10ML suspension Take 10 mLs (1 g total) by mouth 4 (four) times daily -  with meals and at bedtime. 420 mL 0   No current facility-administered medications on file prior to visit.    Past Medical History  Diagnosis Date  .  Hypertension   . Hyperlipidemia   . Prostate carcinoma   . 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 BURNEY  . Anxiety   . GERD (gastroesophageal reflux disease)   . Tachycardia     history of  . Fatigue     Past Surgical History  Procedure Laterality Date  . Resection bone tumor rib  [id number]    RT VAT , RESECTION OF 3rd,4th ,5th ribs with reconstruction with allograft and titanium platesDR. BURNEY  . Dental surgery    . Esophagogastroduodenoscopy N/A 03/14/2014    Procedure: ESOPHAGOGASTRODUODENOSCOPY (EGD);  Surgeon: Rogene Houston, MD;  Location: [facility];  Service: Endoscopy;  Laterality: N/A;    Allergies  Allergen Reactions  . Oxycodone Nausea And Vomiting  . Penicillins Itching and Rash    Current Outpatient Prescriptions on File Prior to Visit  Medication Sig Dispense Refill  . acetaminophen (TYLENOL) 325 MG tablet Take 650 mg by mouth every 6 (six) hours as needed for moderate pain.     Marland Kitchen aspirin EC 81 MG tablet Take 81 mg by mouth at bedtime.    . Calcium Citrate-Vitamin D (CITRACAL + D PO) Take 3 tablets by mouth every morning.    . loratadine (CLARITIN) 10 MG tablet Take 10 mg by mouth daily.    . mirtazapine (REMERON) 15 MG tablet Take 15 mg by mouth at bedtime.    . pantoprazole (PROTONIX) 40 MG tablet Take 1 tablet (40 mg total) by mouth 2 (two) times daily before a meal. 60 tablet 0  . prochlorperazine (COMPAZINE) 10 MG tablet Take 1 tablet (10 mg total) by mouth every 6 (six) hours as needed for nausea or vomiting. 30 tablet 0  . simvastatin (ZOCOR) 80 MG tablet Take 80 mg by mouth at bedtime.      . sucralfate (CARAFATE) 1 GM/10ML suspension Take 10 mLs (1 g total) by mouth 4 (four) times daily -  with meals and at bedtime. 420 mL 0   No current facility-administered medications on file prior to visit.        Objective:   Physical Exam  Filed Vitals:    03/29/14 1349  Height: 5\' 4"  (1.626 m)  Weight: 144 lb 8 oz (65.545 kg)   Alert and oriented. Skin warm and dry. Oral mucosa is moist.  Color is pale.  . Sclera anicteric, conjunctivae is pink. Thyroid not enlarged. No cervical lymphadenopathy.  Lungs clear. Heart regular rate and rhythm.  Abdomen is soft. Bowel sounds are positive. No hepatomegaly. No abdominal masses felt. No tenderness.  No edema to lower          Assessment & Plan:  PUD with GI bleed. EGD revealed Barrett's esophagus and ulcers. CBC today. OV in 3 months with a CBC.

## 2014-03-30 ENCOUNTER — Telehealth (INDEPENDENT_AMBULATORY_CARE_PROVIDER_SITE_OTHER): Payer: Self-pay | Admitting: *Deleted

## 2014-03-30 DIAGNOSIS — K922 Gastrointestinal hemorrhage, unspecified: Secondary | ICD-10-CM

## 2014-03-30 DIAGNOSIS — K2270 Barrett's esophagus without dysplasia: Secondary | ICD-10-CM

## 2014-03-30 LAB — CBC WITH DIFFERENTIAL/PLATELET
BASOS PCT: 0 % (ref 0–1)
Basophils Absolute: 0 10*3/uL (ref 0.0–0.1)
EOS PCT: 2 % (ref 0–5)
Eosinophils Absolute: 0.3 10*3/uL (ref 0.0–0.7)
HCT: 28 % — ABNORMAL LOW (ref 39.0–52.0)
Hemoglobin: 9.2 g/dL — ABNORMAL LOW (ref 13.0–17.0)
Lymphocytes Relative: 11 % — ABNORMAL LOW (ref 12–46)
Lymphs Abs: 1.8 10*3/uL (ref 0.7–4.0)
MCH: 29.9 pg (ref 26.0–34.0)
MCHC: 32.9 g/dL (ref 30.0–36.0)
MCV: 90.9 fL (ref 78.0–100.0)
MONO ABS: 1.7 10*3/uL — AB (ref 0.1–1.0)
MPV: 10.3 fL (ref 9.4–12.4)
Monocytes Relative: 10 % (ref 3–12)
NEUTROS ABS: 12.9 10*3/uL — AB (ref 1.7–7.7)
Neutrophils Relative %: 77 % (ref 43–77)
Platelets: 228 10*3/uL (ref 150–400)
RBC: 3.08 MIL/uL — ABNORMAL LOW (ref 4.22–5.81)
RDW: 14.7 % (ref 11.5–15.5)
WBC: 16.8 10*3/uL — AB (ref 4.0–10.5)

## 2014-03-30 NOTE — Telephone Encounter (Signed)
.  Per Lelon Perla have labs in 3 months.

## 2014-04-05 ENCOUNTER — Other Ambulatory Visit (INDEPENDENT_AMBULATORY_CARE_PROVIDER_SITE_OTHER): Payer: Self-pay | Admitting: Internal Medicine

## 2014-04-05 DIAGNOSIS — K219 Gastro-esophageal reflux disease without esophagitis: Secondary | ICD-10-CM

## 2014-04-05 MED ORDER — SUCRALFATE 1 GM/10ML PO SUSP: 1.0000 g | Freq: Three times a day (TID) | ORAL | Status: DC

## 2014-04-10 ENCOUNTER — Encounter (INDEPENDENT_AMBULATORY_CARE_PROVIDER_SITE_OTHER): Payer: Self-pay

## 2014-04-11 ENCOUNTER — Ambulatory Visit (HOSPITAL_BASED_OUTPATIENT_CLINIC_OR_DEPARTMENT_OTHER): Payer: Medicare Other | Admitting: Lab

## 2014-04-11 ENCOUNTER — Telehealth: Payer: Self-pay | Admitting: Oncology

## 2014-04-11 ENCOUNTER — Telehealth: Payer: Self-pay | Admitting: *Deleted

## 2014-04-11 ENCOUNTER — Ambulatory Visit (HOSPITAL_BASED_OUTPATIENT_CLINIC_OR_DEPARTMENT_OTHER): Payer: Medicare Other

## 2014-04-11 ENCOUNTER — Ambulatory Visit (HOSPITAL_BASED_OUTPATIENT_CLINIC_OR_DEPARTMENT_OTHER): Payer: Medicare Other | Admitting: Oncology

## 2014-04-11 VITALS — BP 106/60 | HR 52 | Temp 97.9°F | Resp 18 | Ht 64.0 in | Wt 150.7 lb

## 2014-04-11 DIAGNOSIS — C61 Malignant neoplasm of prostate: Secondary | ICD-10-CM

## 2014-04-11 DIAGNOSIS — Z5111 Encounter for antineoplastic chemotherapy: Secondary | ICD-10-CM

## 2014-04-11 LAB — COMPREHENSIVE METABOLIC PANEL (CC13)
ALBUMIN: 3 g/dL — AB (ref 3.5–5.0)
ALT: 15 U/L (ref 0–55)
ANION GAP: 7 meq/L (ref 3–11)
AST: 26 U/L (ref 5–34)
Alkaline Phosphatase: 96 U/L (ref 40–150)
BILIRUBIN TOTAL: 0.37 mg/dL (ref 0.20–1.20)
BUN: 12.9 mg/dL (ref 7.0–26.0)
CO2: 29 meq/L (ref 22–29)
Calcium: 8.9 mg/dL (ref 8.4–10.4)
Chloride: 103 mEq/L (ref 98–109)
Creatinine: 0.7 mg/dL (ref 0.7–1.3)
GLUCOSE: 94 mg/dL (ref 70–140)
Potassium: 4.2 mEq/L (ref 3.5–5.1)
Sodium: 140 mEq/L (ref 136–145)
Total Protein: 5.8 g/dL — ABNORMAL LOW (ref 6.4–8.3)

## 2014-04-11 LAB — CBC WITH DIFFERENTIAL/PLATELET
BASO%: 1.4 % (ref 0.0–2.0)
Basophils Absolute: 0.1 10*3/uL (ref 0.0–0.1)
EOS ABS: 0.1 10*3/uL (ref 0.0–0.5)
EOS%: 1.2 % (ref 0.0–7.0)
HEMATOCRIT: 33 % — AB (ref 38.4–49.9)
HGB: 10.3 g/dL — ABNORMAL LOW (ref 13.0–17.1)
LYMPH#: 1 10*3/uL (ref 0.9–3.3)
LYMPH%: 14 % (ref 14.0–49.0)
MCH: 29.2 pg (ref 27.2–33.4)
MCHC: 31.2 g/dL — ABNORMAL LOW (ref 32.0–36.0)
MCV: 93.5 fL (ref 79.3–98.0)
MONO#: 0.9 10*3/uL (ref 0.1–0.9)
MONO%: 12.4 % (ref 0.0–14.0)
NEUT#: 5.2 10*3/uL (ref 1.5–6.5)
NEUT%: 71 % (ref 39.0–75.0)
Platelets: 351 10*3/uL (ref 140–400)
RBC: 3.53 10*6/uL — ABNORMAL LOW (ref 4.20–5.82)
RDW: 16.5 % — ABNORMAL HIGH (ref 11.0–14.6)
WBC: 7.3 10*3/uL (ref 4.0–10.3)

## 2014-04-11 MED ORDER — ONDANSETRON 8 MG/50ML IVPB (CHCC)
8.0000 mg | Freq: Once | INTRAVENOUS | Status: AC
  Administered 2014-04-11: 8 mg via INTRAVENOUS

## 2014-04-11 MED ORDER — DEXAMETHASONE SODIUM PHOSPHATE 10 MG/ML IJ SOLN
10.0000 mg | Freq: Once | INTRAMUSCULAR | Status: AC
  Administered 2014-04-11: 10 mg via INTRAVENOUS

## 2014-04-11 MED ORDER — DOCETAXEL CHEMO INJECTION 160 MG/16ML
75.0000 mg/m2 | Freq: Once | INTRAVENOUS | Status: AC
  Administered 2014-04-11: 130 mg via INTRAVENOUS
  Filled 2014-04-11: qty 13

## 2014-04-11 MED ORDER — DEXAMETHASONE SODIUM PHOSPHATE 10 MG/ML IJ SOLN
INTRAMUSCULAR | Status: AC
  Filled 2014-04-11: qty 1

## 2014-04-11 MED ORDER — ONDANSETRON 8 MG/NS 50 ML IVPB
INTRAVENOUS | Status: AC
  Filled 2014-04-11: qty 8

## 2014-04-11 MED ORDER — SODIUM CHLORIDE 0.9 % IV SOLN
Freq: Once | INTRAVENOUS | Status: AC
  Administered 2014-04-11: 11:00:00 via INTRAVENOUS

## 2014-04-11 NOTE — Telephone Encounter (Signed)
Per staff message and POF I have scheduled appts. Advised scheduler of appts. JMW  

## 2014-04-11 NOTE — Telephone Encounter (Signed)
Gave avs & cal for Jan/Feb. Sent mess to sch tx. °

## 2014-04-11 NOTE — Progress Notes (Signed)
Hematology and Oncology Follow Up Visit  [name] [name] [id number] 17-Feb-1941 73 y.o. 04/11/2014 10:37 AM   Principle Diagnosis: 73 year old gentleman with castration resistant metastatic prostate cancer with disease to the bone. His initial diagnosis was in 2012 where he presented with metastatic disease to the rib. His PSA was 187.   Prior Therapy:  He was found to have a mass in the right axilla near the fourth rib encasing the right with some sclerosis. He underwent a right VATS procedure and resection of the third, fourth and fifth rib and reconstruction with titanium plates under the care of Dr. Arlyce Dice on November 13 2010.  He went on to receive androgen deprivation therapy under the care of Dr. Gaynelle Arabian. His PSA nadir was to 0.1 back in May of 2013 and subsequently started to rise.  He was treated with Provenge in the fall of 2014.  After his PSA went up to 178.6 and Xtandi was started in February of 2015 and discontinued in 09/2013 due to progression of disease and poor tolerance.  Current therapy: Taxotere chemotherapy started on 10/05/2013. He is here for cycle 10.  Interim History:  [name] [name] presents today for a followup visit.  Since his last visit, he continues to do well and tolerate chemotherapy without any major complications. He did report bilateral ankle edema the last 2 weeks. He has not reported any upper extremity edema or shortness of breath. He does report occasionally elevating his legs have helped. He has never been on diuretics before. He did not report any orthopnea or dyspnea on exertion. He reports that he is had a persistent cough over the years and the gastroenterologist, Dr. Laural Golden, who did is procedure felt that his cough is likely related to his gastroesophageal reflux disease.  Since being placed on the Protonix and Carafate his cough has decreased.   He continues to have excellent clinical benefit from systemic chemotherapy.  He did not report any nausea or  vomiting. Did not report any peripheral neuropathy. Did not report any infusion-related complications. He does report some bone pain associated with Neulasta but otherwise no other complications. His appetite has improved. His pain has also improved dramatically as well.His mobility and performance status remains the same at this time. He does not report any headaches or blurry vision or double vision. Has not reported any syncope or change in his mental status. He does have memory issues and is currently under evaluation for that. He is not reporting any chest pain or shortness of breath.  Is that report any palpitation or leg edema. Does not report any frequency urgency or hesitancy. Does not report any skeletal complications. Does not report any lymphadenopathy or petechiae. Remainder of his review of systems unremarkable.  Medications: I have reviewed the patient's current medications.  Current Outpatient Prescriptions  Medication Sig Dispense Refill  . acetaminophen (TYLENOL) 325 MG tablet Take 650 mg by mouth every 6 (six) hours as needed for moderate pain.     Marland Kitchen aspirin EC 81 MG tablet Take 81 mg by mouth at bedtime.    . Calcium Citrate-Vitamin D (CITRACAL + D PO) Take 3 tablets by mouth every morning.    . ferrous sulfate 325 (65 FE) MG tablet Take 325 mg by mouth daily with breakfast.    . loratadine (CLARITIN) 10 MG tablet Take 10 mg by mouth daily.    . mirtazapine (REMERON) 15 MG tablet Take 15 mg by mouth at bedtime.    . pantoprazole (PROTONIX) 40  MG tablet Take 1 tablet (40 mg total) by mouth 2 (two) times daily before a meal. 60 tablet 0  . prochlorperazine (COMPAZINE) 10 MG tablet Take 1 tablet (10 mg total) by mouth every 6 (six) hours as needed for nausea or vomiting. 30 tablet 0  . simvastatin (ZOCOR) 80 MG tablet Take 80 mg by mouth at bedtime.       No current facility-administered medications for this visit.     Allergies:  Allergies  Allergen Reactions  . Oxycodone  Nausea And Vomiting  . Penicillins Itching and Rash    Past Medical History, Surgical history, Social history, and Family History were reviewed and updated.   Physical Exam: Blood pressure 106/60, pulse 52, temperature 97.9 F (36.6 C), temperature source Oral, resp. rate 18, height 5\' 4"  (1.626 m), weight 150 lb 11.2 oz (68.357 kg), SpO2 98 %. ECOG:  1 General appearance: alert and cooperative Head: Normocephalic, without obvious abnormality Neck: no adenopathy Lymph nodes: Cervical, supraclavicular, and axillary nodes normal. Heart:regular rate and rhythm, S1, S2 normal, no murmur, click, rub or gallop Lung:chest clear, no wheezing, rales, normal symmetric air entry Abdomin: soft, non-tender, without masses or organomegaly EXT: 1+ edema bilaterally at the level of the ankle. Neurological: No deficits noted.  Lab Results: Lab Results  Component Value Date   WBC 7.3 04/11/2014   HGB 10.3* 04/11/2014   HCT 33.0* 04/11/2014   MCV 93.5 04/11/2014   PLT 351 04/11/2014     Chemistry      Component Value Date/Time   NA 140 03/22/2014 1114   NA 140 03/15/2014 0553   K 3.7 03/22/2014 1114   K 3.5* 03/15/2014 0553   CL 107 03/15/2014 0553   CO2 26 03/22/2014 1114   CO2 23 03/15/2014 0553   BUN 11.4 03/22/2014 1114   BUN 30* 03/15/2014 0553   CREATININE 0.7 03/22/2014 1114   CREATININE 0.69 03/15/2014 0553      Component Value Date/Time   CALCIUM 8.7 03/22/2014 1114   CALCIUM 8.1* 03/15/2014 0553   ALKPHOS 89 03/22/2014 1114   ALKPHOS 82 03/15/2014 0553   AST 23 03/22/2014 1114   AST 16 03/15/2014 0553   ALT 14 03/22/2014 1114   ALT 8 03/15/2014 0553   BILITOT 0.36 03/22/2014 1114   BILITOT 0.6 03/15/2014 0553       Results for [name] [name] (MRN [id number]) as of 04/11/2014 10:25  Ref. Range 01/18/2014 09:34 02/08/2014 09:31 03/01/2014 09:42 03/22/2014 11:15  PSA Latest Range: <=4.00 ng/mL 1.20 0.97 0.98 0.73       Impression and Plan:  73 year old  gentleman with the following issues:  1. 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 He is currently receiving Taxotere chemotherapy and ready to proceed with cycle 10. He tolerated chemotherapy well so far with excellent response in his PSA. His PSA down to 0.73 from 292. The plan is to continue with the current dose and schedule given his excellent response and excellent clinical benefit.  He does have lower extremity edema which could be related to Taxotere chemotherapy. I have instructed him to continue elevate his legs consistently. I also have recommended compression stockings. If these maneuvers do not work, we can consider a diuretic.  2.Bone pain: Improved with systemic chemotherapy.  3. Recurrent cough: Seems to be improving with Carafate and reflux medication.  4. Nausea prophylaxis: He has antiemetics prescribed and has not needed it at this time.  5. IV access: continue to  discuss with him the role of Port-A-Cath and he would like to continue with peripheral veins for the time being. If his veins become an issue we will proceed with a Port-A-Cath insertion.  6. Neutropenia prophylaxis: He will continue Neulasta after each chemotherapy cycle  7. Followup: Will be in 3 weeks before the next chemotherapy cycle.   IWOEHO,ZYYQM, MD 12/23/201510:37 AM

## 2014-04-11 NOTE — Patient Instructions (Signed)
[facility] Discharge Instructions for Patients Receiving Chemotherapy  Today you received the following chemotherapy agents: taxotere  To help prevent nausea and vomiting after your treatment, we encourage you to take your nausea medication.  Take it as often as prescribed.     If you develop nausea and vomiting that is not controlled by your nausea medication, call the clinic. If it is after clinic hours your family physician or the after hours number for the clinic or go to the Emergency Department.   BELOW ARE SYMPTOMS THAT SHOULD BE REPORTED IMMEDIATELY:  *FEVER GREATER THAN 100.5 F  *CHILLS WITH OR WITHOUT FEVER  NAUSEA AND VOMITING THAT IS NOT CONTROLLED WITH YOUR NAUSEA MEDICATION  *UNUSUAL SHORTNESS OF BREATH  *UNUSUAL BRUISING OR BLEEDING  TENDERNESS IN MOUTH AND THROAT WITH OR WITHOUT PRESENCE OF ULCERS  *URINARY PROBLEMS  *BOWEL PROBLEMS  UNUSUAL RASH Items with * indicate a potential emergency and should be followed up as soon as possible.  Feel free to call the clinic you have any questions or concerns. The clinic phone number is [phone].   I have been informed and understand all the instructions given to me. I know to contact the clinic, my physician, or go to the Emergency Department if any problems should occur. I do not have any questions at this time, but understand that I may call the clinic during office hours   should I have any questions or need assistance in obtaining follow up care.    __________________________________________  _____________  __________ Signature of Patient or Authorized Representative            Date                   Time    __________________________________________ Nurse's Signature    

## 2014-04-12 ENCOUNTER — Ambulatory Visit (HOSPITAL_BASED_OUTPATIENT_CLINIC_OR_DEPARTMENT_OTHER): Payer: Medicare Other

## 2014-04-12 ENCOUNTER — Telehealth: Payer: Self-pay | Admitting: Medical Oncology

## 2014-04-12 DIAGNOSIS — C61 Malignant neoplasm of prostate: Secondary | ICD-10-CM | POA: Diagnosis not present

## 2014-04-12 LAB — PSA: PSA: 0.66 ng/mL (ref <=4.00)

## 2014-04-12 MED ORDER — PEGFILGRASTIM INJECTION 6 MG/0.6ML ~~LOC~~
6.0000 mg | PREFILLED_SYRINGE | Freq: Once | SUBCUTANEOUS | Status: AC
  Administered 2014-04-12: 6 mg via SUBCUTANEOUS
  Filled 2014-04-12: qty 0.6

## 2014-04-12 NOTE — Telephone Encounter (Signed)
Pt here for injection today. Per MD, PSA results given. Patient expressed thanks, denies questions.

## 2014-04-12 NOTE — Patient Instructions (Signed)
Pegfilgrastim injection What is this medicine? PEGFILGRASTIM (peg fil GRA stim) is a long-acting granulocyte colony-stimulating factor that stimulates the growth of neutrophils, a type of white blood cell important in the body's fight against infection. It is used to reduce the incidence of fever and infection in patients with certain types of cancer who are receiving chemotherapy that affects the bone marrow. This medicine may be used for other purposes; ask your health care provider or pharmacist if you have questions. COMMON BRAND NAME(S): Neulasta What should I tell my health care provider before I take this medicine? They need to know if you have any of these conditions: -latex allergy -ongoing radiation therapy -sickle cell disease -skin reactions to acrylic adhesives (On-Body Injector only) -an unusual or allergic reaction to pegfilgrastim, filgrastim, other medicines, foods, dyes, or preservatives -pregnant or trying to get pregnant -breast-feeding How should I use this medicine? This medicine is for injection under the skin. If you get this medicine at home, you will be taught how to prepare and give the pre-filled syringe or how to use the On-body Injector. Refer to the patient Instructions for Use for detailed instructions. Use exactly as directed. Take your medicine at regular intervals. Do not take your medicine more often than directed. It is important that you put your used needles and syringes in a special sharps container. Do not put them in a trash can. If you do not have a sharps container, call your pharmacist or healthcare provider to get one. Talk to your pediatrician regarding the use of this medicine in children. Special care may be needed. Overdosage: If you think you have taken too much of this medicine contact a poison control center or emergency room at once. NOTE: This medicine is only for you. Do not share this medicine with others. What if I miss a dose? It is  important not to miss your dose. Call your doctor or health care professional if you miss your dose. If you miss a dose due to an On-body Injector failure or leakage, a new dose should be administered as soon as possible using a single prefilled syringe for manual use. What may interact with this medicine? Interactions have not been studied. Give your health care provider a list of all the medicines, herbs, non-prescription drugs, or dietary supplements you use. Also tell them if you smoke, drink alcohol, or use illegal drugs. Some items may interact with your medicine. This list may not describe all possible interactions. Give your health care provider a list of all the medicines, herbs, non-prescription drugs, or dietary supplements you use. Also tell them if you smoke, drink alcohol, or use illegal drugs. Some items may interact with your medicine. What should I watch for while using this medicine? You may need blood work done while you are taking this medicine. If you are going to need a MRI, CT scan, or other procedure, tell your doctor that you are using this medicine (On-Body Injector only). What side effects may I notice from receiving this medicine? Side effects that you should report to your doctor or health care professional as soon as possible: -allergic reactions like skin rash, itching or hives, swelling of the face, lips, or tongue -dizziness -fever -pain, redness, or irritation at site where injected -pinpoint red spots on the skin -shortness of breath or breathing problems -stomach or side pain, or pain at the shoulder -swelling -tiredness -trouble passing urine Side effects that usually do not require medical attention (report to your doctor   or health care professional if they continue or are bothersome): -bone pain -muscle pain This list may not describe all possible side effects. Call your doctor for medical advice about side effects. You may report side effects to FDA at  1-800-FDA-1088. Where should I keep my medicine? Keep out of the reach of children. Store pre-filled syringes in a refrigerator between 2 and 8 degrees C (36 and 46 degrees F). Do not freeze. Keep in carton to protect from light. Throw away this medicine if it is left out of the refrigerator for more than 48 hours. Throw away any unused medicine after the expiration date. NOTE: This sheet is a summary. It may not cover all possible information. If you have questions about this medicine, talk to your doctor, pharmacist, or health care provider.  2015, [employer]. (2013-07-06 16:14:05)  

## 2014-04-12 NOTE — Telephone Encounter (Signed)
-----   Message from Randolm Idol, [place] sent at 04/12/2014  8:57 AM EST -----   ----- Message -----    From: Wyatt Portela, MD    Sent: 04/12/2014   7:21 AM      To: Randolm Idol, RN  Please call his PSA

## 2014-04-17 ENCOUNTER — Other Ambulatory Visit (INDEPENDENT_AMBULATORY_CARE_PROVIDER_SITE_OTHER): Payer: Self-pay | Admitting: *Deleted

## 2014-04-17 ENCOUNTER — Encounter (INDEPENDENT_AMBULATORY_CARE_PROVIDER_SITE_OTHER): Payer: Self-pay | Admitting: *Deleted

## 2014-04-17 DIAGNOSIS — K922 Gastrointestinal hemorrhage, unspecified: Secondary | ICD-10-CM

## 2014-04-17 DIAGNOSIS — K2270 Barrett's esophagus without dysplasia: Secondary | ICD-10-CM

## 2014-04-25 DIAGNOSIS — D509 Iron deficiency anemia, unspecified: Secondary | ICD-10-CM | POA: Diagnosis not present

## 2014-05-01 ENCOUNTER — Encounter (INDEPENDENT_AMBULATORY_CARE_PROVIDER_SITE_OTHER): Payer: Self-pay

## 2014-05-02 ENCOUNTER — Ambulatory Visit (HOSPITAL_BASED_OUTPATIENT_CLINIC_OR_DEPARTMENT_OTHER): Payer: BLUE CROSS/BLUE SHIELD

## 2014-05-02 ENCOUNTER — Encounter: Payer: Self-pay | Admitting: Physician Assistant

## 2014-05-02 ENCOUNTER — Ambulatory Visit (HOSPITAL_BASED_OUTPATIENT_CLINIC_OR_DEPARTMENT_OTHER): Payer: BLUE CROSS/BLUE SHIELD | Admitting: Physician Assistant

## 2014-05-02 ENCOUNTER — Other Ambulatory Visit (HOSPITAL_BASED_OUTPATIENT_CLINIC_OR_DEPARTMENT_OTHER): Payer: BLUE CROSS/BLUE SHIELD

## 2014-05-02 VITALS — BP 109/63 | HR 104 | Temp 97.9°F | Resp 18 | Ht 64.0 in | Wt 150.1 lb

## 2014-05-02 DIAGNOSIS — C61 Malignant neoplasm of prostate: Secondary | ICD-10-CM | POA: Diagnosis not present

## 2014-05-02 DIAGNOSIS — C7951 Secondary malignant neoplasm of bone: Secondary | ICD-10-CM

## 2014-05-02 DIAGNOSIS — Z5111 Encounter for antineoplastic chemotherapy: Secondary | ICD-10-CM

## 2014-05-02 DIAGNOSIS — R52 Pain, unspecified: Secondary | ICD-10-CM | POA: Diagnosis not present

## 2014-05-02 DIAGNOSIS — M899 Disorder of bone, unspecified: Secondary | ICD-10-CM

## 2014-05-02 LAB — CBC WITH DIFFERENTIAL/PLATELET
BASO%: 1 % (ref 0.0–2.0)
BASOS ABS: 0.1 10*3/uL (ref 0.0–0.1)
EOS%: 0.9 % (ref 0.0–7.0)
Eosinophils Absolute: 0.1 10*3/uL (ref 0.0–0.5)
HEMATOCRIT: 34.5 % — AB (ref 38.4–49.9)
HGB: 11.1 g/dL — ABNORMAL LOW (ref 13.0–17.1)
LYMPH#: 1.4 10*3/uL (ref 0.9–3.3)
LYMPH%: 12.4 % — ABNORMAL LOW (ref 14.0–49.0)
MCH: 29.5 pg (ref 27.2–33.4)
MCHC: 32.2 g/dL (ref 32.0–36.0)
MCV: 91.8 fL (ref 79.3–98.0)
MONO#: 1 10*3/uL — ABNORMAL HIGH (ref 0.1–0.9)
MONO%: 9.2 % (ref 0.0–14.0)
NEUT%: 76.5 % — ABNORMAL HIGH (ref 39.0–75.0)
NEUTROS ABS: 8.6 10*3/uL — AB (ref 1.5–6.5)
NRBC: 0 % (ref 0–0)
Platelets: 347 10*3/uL (ref 140–400)
RBC: 3.76 10*6/uL — ABNORMAL LOW (ref 4.20–5.82)
RDW: 16.8 % — AB (ref 11.0–14.6)
WBC: 11.3 10*3/uL — ABNORMAL HIGH (ref 4.0–10.3)

## 2014-05-02 LAB — COMPREHENSIVE METABOLIC PANEL (CC13)
ALBUMIN: 3.4 g/dL — AB (ref 3.5–5.0)
ALK PHOS: 112 U/L (ref 40–150)
ALT: 14 U/L (ref 0–55)
AST: 28 U/L (ref 5–34)
Anion Gap: 10 mEq/L (ref 3–11)
BUN: 13.7 mg/dL (ref 7.0–26.0)
CALCIUM: 8.5 mg/dL (ref 8.4–10.4)
CO2: 25 meq/L (ref 22–29)
CREATININE: 0.8 mg/dL (ref 0.7–1.3)
Chloride: 103 mEq/L (ref 98–109)
EGFR: 89 mL/min/{1.73_m2} — ABNORMAL LOW (ref >90)
Glucose: 69 mg/dl — ABNORMAL LOW (ref 70–140)
Potassium: 3.6 mEq/L (ref 3.5–5.1)
Sodium: 138 mEq/L (ref 136–145)
Total Bilirubin: 0.5 mg/dL (ref 0.20–1.20)
Total Protein: 6.5 g/dL (ref 6.4–8.3)

## 2014-05-02 MED ORDER — ONDANSETRON 8 MG/50ML IVPB (CHCC)
8.0000 mg | Freq: Once | INTRAVENOUS | Status: AC
  Administered 2014-05-02: 8 mg via INTRAVENOUS

## 2014-05-02 MED ORDER — DEXAMETHASONE SODIUM PHOSPHATE 10 MG/ML IJ SOLN
INTRAMUSCULAR | Status: AC
  Filled 2014-05-02: qty 1

## 2014-05-02 MED ORDER — ONDANSETRON 8 MG/NS 50 ML IVPB
INTRAVENOUS | Status: AC
  Filled 2014-05-02: qty 8

## 2014-05-02 MED ORDER — DEXAMETHASONE SODIUM PHOSPHATE 10 MG/ML IJ SOLN
10.0000 mg | Freq: Once | INTRAMUSCULAR | Status: AC
  Administered 2014-05-02: 10 mg via INTRAVENOUS

## 2014-05-02 MED ORDER — DOCETAXEL CHEMO INJECTION 160 MG/16ML
75.0000 mg/m2 | Freq: Once | INTRAVENOUS | Status: AC
  Administered 2014-05-02: 130 mg via INTRAVENOUS
  Filled 2014-05-02: qty 13

## 2014-05-02 NOTE — Patient Instructions (Signed)
[facility] Discharge Instructions for Patients Receiving Chemotherapy  Today you received the following chemotherapy agents: taxotere  To help prevent nausea and vomiting after your treatment, we encourage you to take your nausea medication.  Take it as often as prescribed.     If you develop nausea and vomiting that is not controlled by your nausea medication, call the clinic. If it is after clinic hours your family physician or the after hours number for the clinic or go to the Emergency Department.   BELOW ARE SYMPTOMS THAT SHOULD BE REPORTED IMMEDIATELY:  *FEVER GREATER THAN 100.5 F  *CHILLS WITH OR WITHOUT FEVER  NAUSEA AND VOMITING THAT IS NOT CONTROLLED WITH YOUR NAUSEA MEDICATION  *UNUSUAL SHORTNESS OF BREATH  *UNUSUAL BRUISING OR BLEEDING  TENDERNESS IN MOUTH AND THROAT WITH OR WITHOUT PRESENCE OF ULCERS  *URINARY PROBLEMS  *BOWEL PROBLEMS  UNUSUAL RASH Items with * indicate a potential emergency and should be followed up as soon as possible.  Feel free to call the clinic you have any questions or concerns. The clinic phone number is [phone].   I have been informed and understand all the instructions given to me. I know to contact the clinic, my physician, or go to the Emergency Department if any problems should occur. I do not have any questions at this time, but understand that I may call the clinic during office hours   should I have any questions or need assistance in obtaining follow up care.    __________________________________________  _____________  __________ Signature of Patient or Authorized Representative            Date                   Time    __________________________________________ Nurse's Signature    

## 2014-05-02 NOTE — Patient Instructions (Signed)
Follow-up in 3 weeks prior to next scheduled cycle of chemotherapy 

## 2014-05-02 NOTE — Progress Notes (Signed)
Hematology and Oncology Follow Up Visit  [name] [name] [id number] 09-Oct-1940 74 y.o. 05/02/2014 3:45 PM   Principle Diagnosis: 74 year old gentleman with castration resistant metastatic prostate cancer with disease to the bone. His initial diagnosis was in 2012 where he presented with metastatic disease to the rib. His PSA was 187.   Prior Therapy:  He was found to have a mass in the right axilla near the fourth rib encasing the right with some sclerosis. He underwent a right VATS procedure and resection of the third, fourth and fifth rib and reconstruction with titanium plates under the care of Dr. Arlyce Dice on November 13 2010.  He went on to receive androgen deprivation therapy under the care of Dr. Gaynelle Arabian. His PSA nadir was to 0.1 back in May of 2013 and subsequently started to rise.  He was treated with Provenge in the fall of 2014.  After his PSA went up to 178.6 and Xtandi was started in February of 2015 and discontinued in 09/2013 due to progression of disease and poor tolerance.  Current therapy: Taxotere chemotherapy started on 10/05/2013. He is here for cycle 11.  Interim History:  [name] [name] presents today for a followup visit.  Since his last visit, he continues to do well and tolerate chemotherapy without any major complications. He does not report any further ankle edema today  He has not reported any upper extremity edema or shortness of breath. . He did not report any orthopnea or dyspnea on exertion. He reports that he is had a persistent cough over the years and the gastroenterologist, Dr. Laural Golden, who did is procedure felt that his cough is likely related to his gastroesophageal reflux disease.  Since being placed on the Protonix and Carafate his cough has decreased. He did not report any significant cough symptoms today.  He continues to have excellent clinical benefit from systemic chemotherapy.  He did not report any nausea or vomiting. Did not report any peripheral neuropathy.  Did not report any infusion-related complications. He does report some bone pain associated with Neulasta but otherwise no other complications. His appetite continues to improve. His pain has also improved dramatically. His mobility and performance status remains the same at this time. He does not report any headaches or blurry vision or double vision. Has not reported any syncope or change in his mental status. He does have memory issues and is currently under evaluation for that. He is not reporting any chest pain or shortness of breath.  Does not report any palpitation or leg edema. Does not report any frequency urgency or hesitancy. Does not report any skeletal complications. Does not report any lymphadenopathy or petechiae. Remainder of his review of systems unremarkable.  Medications: I have reviewed the patient's current medications.  Current Outpatient Prescriptions  Medication Sig Dispense Refill  . aspirin EC 81 MG tablet Take 81 mg by mouth at bedtime.    . Calcium Citrate-Vitamin D (CITRACAL + D PO) Take 3 tablets by mouth every morning.    . ferrous sulfate 325 (65 FE) MG tablet Take 325 mg by mouth daily with breakfast.    . mirtazapine (REMERON) 15 MG tablet Take 15 mg by mouth at bedtime.    . pantoprazole (PROTONIX) 40 MG tablet Take 1 tablet (40 mg total) by mouth 2 (two) times daily before a meal. 60 tablet 0  . simvastatin (ZOCOR) 80 MG tablet Take 80 mg by mouth at bedtime.      Marland Kitchen acetaminophen (TYLENOL) 325 MG tablet Take  650 mg by mouth every 6 (six) hours as needed for moderate pain.     Marland Kitchen prochlorperazine (COMPAZINE) 10 MG tablet Take 1 tablet (10 mg total) by mouth every 6 (six) hours as needed for nausea or vomiting. (Patient not taking: Reported on 05/02/2014) 30 tablet 0   No current facility-administered medications for this visit.   Facility-Administered Medications Ordered in Other Visits  Medication Dose Route Frequency Provider Last Rate Last Dose  . DOCEtaxel  (TAXOTERE) 130 mg in dextrose 5 % 250 mL chemo infusion  75 mg/m2 (Treatment Plan Actual) Intravenous Once Wyatt Portela, MD 263 mL/hr at 05/02/14 1531 130 mg at 05/02/14 1531     Allergies:  Allergies  Allergen Reactions  . Oxycodone Nausea And Vomiting  . Penicillins Itching and Rash    Past Medical History, Surgical history, Social history, and Family History were reviewed and updated.   Physical Exam: Blood pressure 109/63, pulse 104, temperature 97.9 F (36.6 C), temperature source Oral, resp. rate 18, height 5\' 4"  (1.626 m), weight 150 lb 1.6 oz (68.085 kg), SpO2 98 %. ECOG:  1 General appearance: alert and cooperative Head: Normocephalic, without obvious abnormality Neck: no adenopathy Lymph nodes: Cervical, supraclavicular, and axillary nodes normal. Heart:regular rate and rhythm, S1, S2 normal, no murmur, click, rub or gallop Lung:chest clear, no wheezing, rales, normal symmetric air entry Abdomin: soft, non-tender, without masses or organomegaly EXT: trace edema bilaterally at the level of the ankle. Neurological: No deficits noted.  Lab Results: Lab Results  Component Value Date   WBC 11.3* 05/02/2014   HGB 11.1* 05/02/2014   HCT 34.5* 05/02/2014   MCV 91.8 05/02/2014   PLT 347 05/02/2014     Chemistry      Component Value Date/Time   NA 138 05/02/2014 1320   NA 140 03/15/2014 0553   K 3.6 05/02/2014 1320   K 3.5* 03/15/2014 0553   CL 107 03/15/2014 0553   CO2 25 05/02/2014 1320   CO2 23 03/15/2014 0553   BUN 13.7 05/02/2014 1320   BUN 30* 03/15/2014 0553   CREATININE 0.8 05/02/2014 1320   CREATININE 0.69 03/15/2014 0553      Component Value Date/Time   CALCIUM 8.5 05/02/2014 1320   CALCIUM 8.1* 03/15/2014 0553   ALKPHOS 112 05/02/2014 1320   ALKPHOS 82 03/15/2014 0553   AST 28 05/02/2014 1320   AST 16 03/15/2014 0553   ALT 14 05/02/2014 1320   ALT 8 03/15/2014 0553   BILITOT 0.50 05/02/2014 1320   BILITOT 0.6 03/15/2014 0553        Results for [name] [name], [name] [name] (MRN [id number]) as of 04/11/2014 10:25  Ref. Range 01/18/2014 09:34 02/08/2014 09:31 03/01/2014 09:42 03/22/2014 11:15  PSA Latest Range: <=4.00 ng/mL 1.20 0.97 0.98 0.73       Impression and Plan:  74 year old gentleman with the following issues:  1. 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 He is currently receiving Taxotere chemotherapy and ready to proceed with cycle 10. He tolerated chemotherapy well so far with excellent response in his PSA. His PSA down to 0.73 from 292. The plan is to continue with the current dose and schedule given his excellent response and excellent clinical benefit.   2.Bone pain: Improved with systemic chemotherapy.  3. Recurrent cough: Seems to be improving with Carafate and reflux medication.  4. Nausea prophylaxis: He has antiemetics prescribed and has not needed it at this time.  5. IV access: continue to discuss with  him the role of Port-A-Cath and he would like to continue with peripheral veins for the time being. If his veins become an issue we will proceed with a Port-A-Cath insertion.  6. Neutropenia prophylaxis: He will continue Neulasta after each chemotherapy cycle  7. Followup: Will be in 3 weeks before the next chemotherapy cycle.   Carlton Adam, PA-C  1/13/20163:45 PM

## 2014-05-03 ENCOUNTER — Ambulatory Visit: Payer: Medicare Other

## 2014-05-03 LAB — PSA: PSA: 0.71 ng/mL (ref <=4.00)

## 2014-05-04 ENCOUNTER — Ambulatory Visit (HOSPITAL_BASED_OUTPATIENT_CLINIC_OR_DEPARTMENT_OTHER): Payer: Medicare Other

## 2014-05-04 DIAGNOSIS — Z5189 Encounter for other specified aftercare: Secondary | ICD-10-CM

## 2014-05-04 DIAGNOSIS — C61 Malignant neoplasm of prostate: Secondary | ICD-10-CM

## 2014-05-04 DIAGNOSIS — C7951 Secondary malignant neoplasm of bone: Secondary | ICD-10-CM

## 2014-05-04 MED ORDER — PEGFILGRASTIM INJECTION 6 MG/0.6ML ~~LOC~~
6.0000 mg | PREFILLED_SYRINGE | Freq: Once | SUBCUTANEOUS | Status: AC
  Administered 2014-05-04: 6 mg via SUBCUTANEOUS
  Filled 2014-05-04: qty 0.6

## 2014-05-04 NOTE — Patient Instructions (Signed)
Pegfilgrastim injection What is this medicine? PEGFILGRASTIM (peg fil GRA stim) is a long-acting granulocyte colony-stimulating factor that stimulates the growth of neutrophils, a type of white blood cell important in the body's fight against infection. It is used to reduce the incidence of fever and infection in patients with certain types of cancer who are receiving chemotherapy that affects the bone marrow. This medicine may be used for other purposes; ask your health care provider or pharmacist if you have questions. COMMON BRAND NAME(S): Neulasta What should I tell my health care provider before I take this medicine? They need to know if you have any of these conditions: -latex allergy -ongoing radiation therapy -sickle cell disease -skin reactions to acrylic adhesives (On-Body Injector only) -an unusual or allergic reaction to pegfilgrastim, filgrastim, other medicines, foods, dyes, or preservatives -pregnant or trying to get pregnant -breast-feeding How should I use this medicine? This medicine is for injection under the skin. If you get this medicine at home, you will be taught how to prepare and give the pre-filled syringe or how to use the On-body Injector. Refer to the patient Instructions for Use for detailed instructions. Use exactly as directed. Take your medicine at regular intervals. Do not take your medicine more often than directed. It is important that you put your used needles and syringes in a special sharps container. Do not put them in a trash can. If you do not have a sharps container, call your pharmacist or healthcare provider to get one. Talk to your pediatrician regarding the use of this medicine in children. Special care may be needed. Overdosage: If you think you have taken too much of this medicine contact a poison control center or emergency room at once. NOTE: This medicine is only for you. Do not share this medicine with others. What if I miss a dose? It is  important not to miss your dose. Call your doctor or health care professional if you miss your dose. If you miss a dose due to an On-body Injector failure or leakage, a new dose should be administered as soon as possible using a single prefilled syringe for manual use. What may interact with this medicine? Interactions have not been studied. Give your health care provider a list of all the medicines, herbs, non-prescription drugs, or dietary supplements you use. Also tell them if you smoke, drink alcohol, or use illegal drugs. Some items may interact with your medicine. This list may not describe all possible interactions. Give your health care provider a list of all the medicines, herbs, non-prescription drugs, or dietary supplements you use. Also tell them if you smoke, drink alcohol, or use illegal drugs. Some items may interact with your medicine. What should I watch for while using this medicine? You may need blood work done while you are taking this medicine. If you are going to need a MRI, CT scan, or other procedure, tell your doctor that you are using this medicine (On-Body Injector only). What side effects may I notice from receiving this medicine? Side effects that you should report to your doctor or health care professional as soon as possible: -allergic reactions like skin rash, itching or hives, swelling of the face, lips, or tongue -dizziness -fever -pain, redness, or irritation at site where injected -pinpoint red spots on the skin -shortness of breath or breathing problems -stomach or side pain, or pain at the shoulder -swelling -tiredness -trouble passing urine Side effects that usually do not require medical attention (report to your doctor   or health care professional if they continue or are bothersome): -bone pain -muscle pain This list may not describe all possible side effects. Call your doctor for medical advice about side effects. You may report side effects to FDA at  1-800-FDA-1088. Where should I keep my medicine? Keep out of the reach of children. Store pre-filled syringes in a refrigerator between 2 and 8 degrees C (36 and 46 degrees F). Do not freeze. Keep in carton to protect from light. Throw away this medicine if it is left out of the refrigerator for more than 48 hours. Throw away any unused medicine after the expiration date. NOTE: This sheet is a summary. It may not cover all possible information. If you have questions about this medicine, talk to your doctor, pharmacist, or health care provider.  2015, [employer]. (2013-07-06 16:14:05)  

## 2014-05-23 ENCOUNTER — Ambulatory Visit (HOSPITAL_BASED_OUTPATIENT_CLINIC_OR_DEPARTMENT_OTHER): Payer: BLUE CROSS/BLUE SHIELD | Admitting: Oncology

## 2014-05-23 ENCOUNTER — Telehealth: Payer: Self-pay | Admitting: Oncology

## 2014-05-23 ENCOUNTER — Other Ambulatory Visit (HOSPITAL_BASED_OUTPATIENT_CLINIC_OR_DEPARTMENT_OTHER): Payer: BLUE CROSS/BLUE SHIELD

## 2014-05-23 ENCOUNTER — Ambulatory Visit (HOSPITAL_BASED_OUTPATIENT_CLINIC_OR_DEPARTMENT_OTHER): Payer: BLUE CROSS/BLUE SHIELD

## 2014-05-23 ENCOUNTER — Telehealth: Payer: Self-pay | Admitting: *Deleted

## 2014-05-23 VITALS — BP 102/60 | HR 92 | Temp 98.2°F | Resp 18 | Ht 64.0 in | Wt 150.5 lb

## 2014-05-23 DIAGNOSIS — C7951 Secondary malignant neoplasm of bone: Secondary | ICD-10-CM

## 2014-05-23 DIAGNOSIS — R05 Cough: Secondary | ICD-10-CM

## 2014-05-23 DIAGNOSIS — Z5111 Encounter for antineoplastic chemotherapy: Secondary | ICD-10-CM

## 2014-05-23 DIAGNOSIS — C61 Malignant neoplasm of prostate: Secondary | ICD-10-CM

## 2014-05-23 LAB — COMPREHENSIVE METABOLIC PANEL (CC13)
ALBUMIN: 3.1 g/dL — AB (ref 3.5–5.0)
ALT: 14 U/L (ref 0–55)
AST: 25 U/L (ref 5–34)
Alkaline Phosphatase: 97 U/L (ref 40–150)
Anion Gap: 7 mEq/L (ref 3–11)
BUN: 11.7 mg/dL (ref 7.0–26.0)
CO2: 26 meq/L (ref 22–29)
CREATININE: 0.7 mg/dL (ref 0.7–1.3)
Calcium: 8.5 mg/dL (ref 8.4–10.4)
Chloride: 106 mEq/L (ref 98–109)
GLUCOSE: 88 mg/dL (ref 70–140)
POTASSIUM: 4.1 meq/L (ref 3.5–5.1)
SODIUM: 139 meq/L (ref 136–145)
TOTAL PROTEIN: 6 g/dL — AB (ref 6.4–8.3)
Total Bilirubin: 0.43 mg/dL (ref 0.20–1.20)

## 2014-05-23 LAB — CBC WITH DIFFERENTIAL/PLATELET
BASO%: 2.2 % — AB (ref 0.0–2.0)
Basophils Absolute: 0.1 10*3/uL (ref 0.0–0.1)
EOS ABS: 0.1 10*3/uL (ref 0.0–0.5)
EOS%: 1 % (ref 0.0–7.0)
HCT: 34.5 % — ABNORMAL LOW (ref 38.4–49.9)
HGB: 10.9 g/dL — ABNORMAL LOW (ref 13.0–17.1)
LYMPH%: 19.5 % (ref 14.0–49.0)
MCH: 29.7 pg (ref 27.2–33.4)
MCHC: 31.6 g/dL — ABNORMAL LOW (ref 32.0–36.0)
MCV: 94 fL (ref 79.3–98.0)
MONO#: 0.7 10*3/uL (ref 0.1–0.9)
MONO%: 14.5 % — ABNORMAL HIGH (ref 0.0–14.0)
NEUT#: 3.2 10*3/uL (ref 1.5–6.5)
NEUT%: 62.8 % (ref 39.0–75.0)
Platelets: 349 10*3/uL (ref 140–400)
RBC: 3.67 10*6/uL — AB (ref 4.20–5.82)
RDW: 17.5 % — ABNORMAL HIGH (ref 11.0–14.6)
WBC: 5 10*3/uL (ref 4.0–10.3)
lymph#: 1 10*3/uL (ref 0.9–3.3)

## 2014-05-23 MED ORDER — DOCETAXEL CHEMO INJECTION 160 MG/16ML
75.0000 mg/m2 | Freq: Once | INTRAVENOUS | Status: AC
  Administered 2014-05-23: 130 mg via INTRAVENOUS
  Filled 2014-05-23: qty 13

## 2014-05-23 MED ORDER — DEXAMETHASONE SODIUM PHOSPHATE 10 MG/ML IJ SOLN
10.0000 mg | Freq: Once | INTRAMUSCULAR | Status: AC
  Administered 2014-05-23: 10 mg via INTRAVENOUS

## 2014-05-23 MED ORDER — ONDANSETRON 8 MG/NS 50 ML IVPB
INTRAVENOUS | Status: AC
  Filled 2014-05-23: qty 8

## 2014-05-23 MED ORDER — ONDANSETRON 8 MG/50ML IVPB (CHCC)
8.0000 mg | Freq: Once | INTRAVENOUS | Status: AC
  Administered 2014-05-23: 8 mg via INTRAVENOUS

## 2014-05-23 MED ORDER — SODIUM CHLORIDE 0.9 % IV SOLN
Freq: Once | INTRAVENOUS | Status: AC
  Administered 2014-05-23: 10:00:00 via INTRAVENOUS

## 2014-05-23 MED ORDER — DEXAMETHASONE SODIUM PHOSPHATE 10 MG/ML IJ SOLN
INTRAMUSCULAR | Status: AC
  Filled 2014-05-23: qty 1

## 2014-05-23 NOTE — Telephone Encounter (Signed)
Pt confirmed labs/ov per 02/03 POF, gave pt AVS.... KJ, sent msg to add chemo

## 2014-05-23 NOTE — Patient Instructions (Signed)
[facility] Discharge Instructions for Patients Receiving Chemotherapy  Today you received the following chemotherapy agents: Taxotere  To help prevent nausea and vomiting after your treatment, we encourage you to take your nausea medication as prescribed.    If you develop nausea and vomiting that is not controlled by your nausea medication, call the clinic.   BELOW ARE SYMPTOMS THAT SHOULD BE REPORTED IMMEDIATELY:  *FEVER GREATER THAN 100.5 F  *CHILLS WITH OR WITHOUT FEVER  NAUSEA AND VOMITING THAT IS NOT CONTROLLED WITH YOUR NAUSEA MEDICATION  *UNUSUAL SHORTNESS OF BREATH  *UNUSUAL BRUISING OR BLEEDING  TENDERNESS IN MOUTH AND THROAT WITH OR WITHOUT PRESENCE OF ULCERS  *URINARY PROBLEMS  *BOWEL PROBLEMS  UNUSUAL RASH Items with * indicate a potential emergency and should be followed up as soon as possible.  Feel free to call the clinic you have any questions or concerns. The clinic phone number is [phone].    

## 2014-05-23 NOTE — Telephone Encounter (Signed)
Per staff message and POF I have scheduled appts. Advised scheduler of appts. JMW  

## 2014-05-23 NOTE — Progress Notes (Signed)
Hematology and Oncology Follow Up Visit  [name] [name] [id number] 10/03/1940 73 y.o. 05/23/2014 9:20 AM   Principle Diagnosis: 74 year old gentleman with castration resistant metastatic prostate cancer with disease to the bone. His initial diagnosis was in 2012 where he presented with metastatic disease to the rib. His PSA was 187.   Prior Therapy:  He was found to have a mass in the right axilla near the fourth rib encasing the right with some sclerosis. He underwent a right VATS procedure and resection of the third, fourth and fifth rib and reconstruction with titanium plates under the care of Dr. Arlyce Dice on November 13 2010.  He went on to receive androgen deprivation therapy under the care of Dr. Gaynelle Arabian. His PSA nadir was to 0.1 back in May of 2013 and subsequently started to rise.  He was treated with Provenge in the fall of 2014.  After his PSA went up to 178.6 and Xtandi was started in February of 2015 and discontinued in 09/2013 due to progression of disease and poor tolerance.  Current therapy: Taxotere chemotherapy started on 10/05/2013. He is here for cycle 12.  Interim History:  [name] [name] presents today for a followup visit. Since his last visit, he reports no new complaints. He continues to chemotherapy without any major complications. He does report some occasional fatigue and exertional dyspnea but have been relatively stable. Is able to perform most activities of daily living. He does not report any peripheral neuropathy but does report finger and toe nail changes. He continues to have excellent clinical benefit from systemic chemotherapy.  He did not report any nausea or vomiting. He does report some bone pain associated with Neulasta but otherwise no other complications. His pain has also improved dramatically since the start of chemotherapy. His mobility and performance status remains the same at this time. He does not report any headaches or blurry vision or double vision. Has  not reported any syncope or change in his mental status. He does have memory issues and is currently under evaluation for that. He is not reporting any chest pain or shortness of breath.  Does not report any palpitation or leg edema. Does not report any frequency urgency or hesitancy. Does not report any skeletal complications. Does not report any lymphadenopathy or petechiae. Remainder of his review of systems unremarkable.  Medications: I have reviewed the patient's current medications.  Current Outpatient Prescriptions  Medication Sig Dispense Refill  . acetaminophen (TYLENOL) 325 MG tablet Take 650 mg by mouth every 6 (six) hours as needed for moderate pain.     Marland Kitchen aspirin EC 81 MG tablet Take 81 mg by mouth at bedtime.    . Calcium Citrate-Vitamin D (CITRACAL + D PO) Take 3 tablets by mouth every morning.    . ferrous sulfate 325 (65 FE) MG tablet Take 325 mg by mouth daily with breakfast.    . mirtazapine (REMERON) 15 MG tablet Take 15 mg by mouth at bedtime.    . pantoprazole (PROTONIX) 40 MG tablet Take 1 tablet (40 mg total) by mouth 2 (two) times daily before a meal. 60 tablet 0  . prochlorperazine (COMPAZINE) 10 MG tablet Take 1 tablet (10 mg total) by mouth every 6 (six) hours as needed for nausea or vomiting. 30 tablet 0  . simvastatin (ZOCOR) 80 MG tablet Take 80 mg by mouth at bedtime.       No current facility-administered medications for this visit.     Allergies:  Allergies  Allergen Reactions  .  Oxycodone Nausea And Vomiting  . Penicillins Itching and Rash    Past Medical History, Surgical history, Social history, and Family History were reviewed and updated.   Physical Exam: Blood pressure 102/60, pulse 92, temperature 98.2 F (36.8 C), temperature source Oral, resp. rate 18, height 5\' 4"  (1.626 m), weight 150 lb 8 oz (68.266 kg), SpO2 96 %. ECOG:  1 General appearance: alert and cooperative not in any distress. Head: Normocephalic, without obvious  abnormality Neck: no adenopathy Lymph nodes: Cervical, supraclavicular, and axillary nodes normal. Heart:regular rate and rhythm, S1, S2 normal, no murmur, click, rub or gallop Lung:chest clear, no wheezing, rales, normal symmetric air entry Abdomin: soft, non-tender, without masses or organomegaly EXT: trace edema bilaterally at the level of the ankle. Neurological: No deficits noted.  Lab Results: Lab Results  Component Value Date   WBC 5.0 05/23/2014   HGB 10.9* 05/23/2014   HCT 34.5* 05/23/2014   MCV 94.0 05/23/2014   PLT 349 05/23/2014     Chemistry      Component Value Date/Time   NA 138 05/02/2014 1320   NA 140 03/15/2014 0553   K 3.6 05/02/2014 1320   K 3.5* 03/15/2014 0553   CL 107 03/15/2014 0553   CO2 25 05/02/2014 1320   CO2 23 03/15/2014 0553   BUN 13.7 05/02/2014 1320   BUN 30* 03/15/2014 0553   CREATININE 0.8 05/02/2014 1320   CREATININE 0.69 03/15/2014 0553      Component Value Date/Time   CALCIUM 8.5 05/02/2014 1320   CALCIUM 8.1* 03/15/2014 0553   ALKPHOS 112 05/02/2014 1320   ALKPHOS 82 03/15/2014 0553   AST 28 05/02/2014 1320   AST 16 03/15/2014 0553   ALT 14 05/02/2014 1320   ALT 8 03/15/2014 0553   BILITOT 0.50 05/02/2014 1320   BILITOT 0.6 03/15/2014 0553        Results for [name], [name] (MRN [id number]) as of 05/23/2014 09:04  Ref. Range 03/01/2014 09:42 03/22/2014 11:15 04/11/2014 10:05 05/02/2014 13:20  PSA Latest Range: <=4.00 ng/mL 0.98 0.73 0.66 0.71       Impression and Plan:  74 year old gentleman with the following issues:  1. 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 He is currently receiving Taxotere chemotherapy and ready to proceed with cycle 12. He tolerated chemotherapy well so far with excellent response in his PSA. His PSA down to 0.71 from 292. I discussed the risks and benefits of continuing chemotherapy versus discontinuation. At this time, he does not need a treatment  break and would like to continue. I see no cumulative side effects at this time that requires interruptions. We will continue with the current dose and schedule and we'll assess on a regular basis.  2. Bone pain: Improved with systemic chemotherapy.  3. Recurrent cough: Seems to be improving with Carafate and reflux medication.  4. Nausea prophylaxis: He has antiemetics prescribed and has not needed it at this time.  5. IV access: Risks and benefits of a Port-A-Cath insertion was discussed again and finally he agrees. Complications include bleeding, thrombosis and infection were discussed and we will proceed with Port-A-Cath insertion by interventional radiology for the next cycle of chemotherapy.  6. Neutropenia prophylaxis: He will continue Neulasta after each chemotherapy cycle  7. Followup: Will be in 3 weeks before the next chemotherapy cycle.   [facility], MD 2/3/20169:20 AM

## 2014-05-24 ENCOUNTER — Ambulatory Visit (HOSPITAL_BASED_OUTPATIENT_CLINIC_OR_DEPARTMENT_OTHER): Payer: Medicare Other

## 2014-05-24 DIAGNOSIS — Z5189 Encounter for other specified aftercare: Secondary | ICD-10-CM

## 2014-05-24 DIAGNOSIS — C7951 Secondary malignant neoplasm of bone: Secondary | ICD-10-CM

## 2014-05-24 DIAGNOSIS — C61 Malignant neoplasm of prostate: Secondary | ICD-10-CM

## 2014-05-24 LAB — PSA: PSA: 0.58 ng/mL (ref <=4.00)

## 2014-05-24 MED ORDER — PEGFILGRASTIM INJECTION 6 MG/0.6ML ~~LOC~~
6.0000 mg | PREFILLED_SYRINGE | Freq: Once | SUBCUTANEOUS | Status: AC
  Administered 2014-05-24: 6 mg via SUBCUTANEOUS
  Filled 2014-05-24: qty 0.6

## 2014-06-05 ENCOUNTER — Other Ambulatory Visit: Payer: Self-pay | Admitting: Radiology

## 2014-06-07 ENCOUNTER — Other Ambulatory Visit: Payer: Self-pay | Admitting: Radiology

## 2014-06-08 ENCOUNTER — Ambulatory Visit (HOSPITAL_COMMUNITY)
Admission: RE | Admit: 2014-06-08 | Discharge: 2014-06-08 | Disposition: A | Discharge status: Home / Self Care | Payer: Medicare Other | Source: Ambulatory Visit | Attending: Oncology | Admitting: Oncology

## 2014-06-08 ENCOUNTER — Other Ambulatory Visit: Payer: Self-pay | Admitting: Gynecologic Oncology

## 2014-06-08 ENCOUNTER — Other Ambulatory Visit: Payer: Self-pay | Admitting: Oncology

## 2014-06-08 ENCOUNTER — Encounter (HOSPITAL_COMMUNITY): Payer: Self-pay

## 2014-06-08 DIAGNOSIS — C61 Malignant neoplasm of prostate: Secondary | ICD-10-CM | POA: Diagnosis present

## 2014-06-08 DIAGNOSIS — Z87891 Personal history of nicotine dependence: Secondary | ICD-10-CM | POA: Insufficient documentation

## 2014-06-08 DIAGNOSIS — Z79899 Other long term (current) drug therapy: Secondary | ICD-10-CM | POA: Diagnosis not present

## 2014-06-08 DIAGNOSIS — K219 Gastro-esophageal reflux disease without esophagitis: Secondary | ICD-10-CM | POA: Diagnosis not present

## 2014-06-08 DIAGNOSIS — I10 Essential (primary) hypertension: Secondary | ICD-10-CM | POA: Diagnosis not present

## 2014-06-08 DIAGNOSIS — E785 Hyperlipidemia, unspecified: Secondary | ICD-10-CM | POA: Diagnosis not present

## 2014-06-08 DIAGNOSIS — C7951 Secondary malignant neoplasm of bone: Secondary | ICD-10-CM | POA: Insufficient documentation

## 2014-06-08 DIAGNOSIS — F419 Anxiety disorder, unspecified: Secondary | ICD-10-CM | POA: Diagnosis not present

## 2014-06-08 DIAGNOSIS — Z7982 Long term (current) use of aspirin: Secondary | ICD-10-CM | POA: Diagnosis not present

## 2014-06-08 LAB — CBC WITH DIFFERENTIAL/PLATELET
Basophils Absolute: 0.1 10*3/uL (ref 0.0–0.1)
Basophils Relative: 1 % (ref 0–1)
Eosinophils Absolute: 0.1 10*3/uL (ref 0.0–0.7)
Eosinophils Relative: 1 % (ref 0–5)
HEMATOCRIT: 36.5 % — AB (ref 39.0–52.0)
Hemoglobin: 11.7 g/dL — ABNORMAL LOW (ref 13.0–17.0)
LYMPHS PCT: 15 % (ref 12–46)
Lymphs Abs: 1.2 10*3/uL (ref 0.7–4.0)
MCH: 30.2 pg (ref 26.0–34.0)
MCHC: 32.1 g/dL (ref 30.0–36.0)
MCV: 94.3 fL (ref 78.0–100.0)
MONOS PCT: 8 % (ref 3–12)
Monocytes Absolute: 0.6 10*3/uL (ref 0.1–1.0)
NEUTROS ABS: 6 10*3/uL (ref 1.7–7.7)
NEUTROS PCT: 75 % (ref 43–77)
Platelets: 296 10*3/uL (ref 150–400)
RBC: 3.87 MIL/uL — AB (ref 4.22–5.81)
RDW: 17.4 % — ABNORMAL HIGH (ref 11.5–15.5)
WBC: 7.9 10*3/uL (ref 4.0–10.5)

## 2014-06-08 LAB — PROTIME-INR
INR: 1.04 (ref 0.00–1.49)
Prothrombin Time: 13.7 seconds (ref 11.6–15.2)

## 2014-06-08 MED ORDER — HEPARIN SOD (PORK) LOCK FLUSH 100 UNIT/ML IV SOLN
INTRAVENOUS | Status: AC
  Filled 2014-06-08: qty 5

## 2014-06-08 MED ORDER — LIDOCAINE HCL 1 % IJ SOLN
INTRAMUSCULAR | Status: AC
  Filled 2014-06-08: qty 20

## 2014-06-08 MED ORDER — FENTANYL CITRATE 0.05 MG/ML IJ SOLN
INTRAMUSCULAR | Status: AC
  Filled 2014-06-08: qty 4

## 2014-06-08 MED ORDER — HEPARIN SOD (PORK) LOCK FLUSH 100 UNIT/ML IV SOLN
INTRAVENOUS | Status: AC | PRN
  Administered 2014-06-08: 500 [IU]

## 2014-06-08 MED ORDER — SODIUM CHLORIDE 0.9 % IV SOLN
INTRAVENOUS | Status: DC
  Administered 2014-06-08: 10:00:00 via INTRAVENOUS

## 2014-06-08 MED ORDER — MIDAZOLAM HCL 2 MG/2ML IJ SOLN
INTRAMUSCULAR | Status: AC | PRN
  Administered 2014-06-08: 1 mg via INTRAVENOUS
  Administered 2014-06-08: 0.5 mg via INTRAVENOUS

## 2014-06-08 MED ORDER — IOHEXOL 300 MG/ML  SOLN: 20.0000 mL | Freq: Once | INTRAMUSCULAR | Status: DC | PRN

## 2014-06-08 MED ORDER — MIDAZOLAM HCL 2 MG/2ML IJ SOLN
INTRAMUSCULAR | Status: AC
  Filled 2014-06-08: qty 6

## 2014-06-08 MED ORDER — VANCOMYCIN HCL IN DEXTROSE 1-5 GM/200ML-% IV SOLN
1000.0000 mg | Freq: Once | INTRAVENOUS | Status: AC
  Administered 2014-06-08: 1000 mg via INTRAVENOUS
  Filled 2014-06-08: qty 200

## 2014-06-08 MED ORDER — LIDOCAINE-EPINEPHRINE 2 %-1:100000 IJ SOLN
INTRAMUSCULAR | Status: AC
  Filled 2014-06-08: qty 1

## 2014-06-08 MED ORDER — FENTANYL CITRATE 0.05 MG/ML IJ SOLN
INTRAMUSCULAR | Status: AC | PRN
  Administered 2014-06-08: 25 ug via INTRAVENOUS

## 2014-06-08 NOTE — H&P (Signed)
Chief Complaint: Prostate cancer Metastasis to bone  Referring Physician(s): Shadad,Firas N  History of Present Illness: [name] [name] is a 74 y.o. male   Hx prostate cancer to bone Dx 2012 Elevation of PSA and now being treated with chemotherapy Scheduled for port a cath placement in IR  Past Medical History  Diagnosis Date  . Hyperlipidemia   . Prostate carcinoma   . 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 BURNEY  . Anxiety   . GERD (gastroesophageal reflux disease)   . Tachycardia     history of  . Fatigue   . Hypertension     denies, has had hypotension    Past Surgical History  Procedure Laterality Date  . Resection bone tumor rib  [id number]    RT VAT , RESECTION OF 3rd,4th ,5th ribs with reconstruction with allograft and titanium platesDR. BURNEY  . Dental surgery    . Esophagogastroduodenoscopy N/A 03/14/2014    Procedure: ESOPHAGOGASTRODUODENOSCOPY (EGD);  Surgeon: Rogene Houston, MD;  Location: [facility];  Service: Endoscopy;  Laterality: N/A;    Allergies: Oxycodone and Penicillins  Medications: Prior to Admission medications   Medication Sig Start Date End Date Taking? Authorizing Provider  aspirin EC 81 MG tablet Take 81 mg by mouth at bedtime.   Yes Historical Provider, MD  Calcium Citrate-Vitamin D (CITRACAL + D PO) Take 3 tablets by mouth every morning.   Yes Historical Provider, MD  ferrous sulfate 325 (65 FE) MG tablet Take 325 mg by mouth daily with breakfast.   Yes Historical Provider, MD  mirtazapine (REMERON) 15 MG tablet Take 15 mg by mouth at bedtime.   Yes Historical Provider, MD  pantoprazole (PROTONIX) 40 MG tablet Take 1 tablet (40 mg total) by mouth 2 (two) times daily before a meal. 03/15/14  Yes Donne Hazel, MD  simvastatin (ZOCOR) 80 MG tablet Take 80 mg by mouth at bedtime.     Yes Historical Provider, MD  acetaminophen  (TYLENOL) 325 MG tablet Take 650 mg by mouth every 6 (six) hours as needed for moderate pain.     Historical Provider, MD  prochlorperazine (COMPAZINE) 10 MG tablet Take 1 tablet (10 mg total) by mouth every 6 (six) hours as needed for nausea or vomiting. 09/20/13   Wyatt Portela, MD     Family History  Problem Relation Age of Onset  . Aneurysm Father     History   Social History  . Marital Status: Married    Spouse Name: N/A  . Number of Children: N/A  . Years of Education: N/A   Social History Main Topics  . Smoking status: Former Smoker -- 2.00 packs/day for 15 years    Types: Cigarettes    Quit date: 04/20/1974  . Smokeless tobacco: Not on file  . Alcohol Use: No  . Drug Use: No  . Sexual Activity: Not on file   Other Topics Concern  . None   Social History Narrative    Review of Systems: A 12 point ROS discussed and pertinent positives are indicated in the HPI above.  All other systems are negative.  Review of Systems  Constitutional: Positive for fatigue. Negative for fever, activity change and appetite change.  Respiratory: Negative for cough and shortness of breath.   Cardiovascular: Negative for chest pain.  Gastrointestinal: Negative for abdominal pain.  Genitourinary: Negative for difficulty urinating.  Musculoskeletal:  Negative for back pain.  Neurological: Positive for weakness.  Psychiatric/Behavioral: Negative for behavioral problems and confusion.    Vital Signs: BP 111/72 mmHg  Pulse 74  Temp(Src) 97.7 F (36.5 C) (Oral)  Resp 16  SpO2 97%  Physical Exam  Constitutional: He is oriented to person, place, and time. He appears well-developed.  Cardiovascular: Normal rate, regular rhythm and normal heart sounds.   No murmur heard. Pulmonary/Chest: Effort normal and breath sounds normal. He has no wheezes.  Abdominal: Soft. Bowel sounds are normal. There is no tenderness.  Musculoskeletal: Normal range of motion.  Neurological: He is alert and  oriented to person, place, and time.  Skin: Skin is warm and dry.  Psychiatric: He has a normal mood and affect. His behavior is normal. Judgment and thought content normal.  Nursing note and vitals reviewed.   Mallampati Score:  MD Evaluation Airway: WNL Heart: WNL Abdomen: WNL Chest/ Lungs: WNL ASA  Classification: 3 Mallampati/Airway Score: One  Imaging: No results found.  Labs:  CBC:  Recent Labs  04/11/14 1005 05/02/14 1320 05/23/14 0842 06/08/14 0930  WBC 7.3 11.3* 5.0 7.9  HGB 10.3* 11.1* 10.9* 11.7*  HCT 33.0* 34.5* 34.5* 36.5*  PLT 351 347 349 296    COAGS:  Recent Labs  03/14/14 1035 06/08/14 0930  INR 1.18 1.04  APTT 28  --     BMP:  Recent Labs  03/14/14 0904 03/15/14 0553 03/22/14 1114 04/11/14 1006 05/02/14 1320 05/23/14 0842  NA 138 140 140 140 138 139  K 4.4 3.5* 3.7 4.2 3.6 4.1  CL 99 107  --   --   --   --   CO2 25 23 26 29 25 26   GLUCOSE 161* 122* 89 94 69* 88  BUN 50* 30* 11.4 12.9 13.7 11.7  CALCIUM 8.6 8.1* 8.7 8.9 8.5 8.5  CREATININE 0.83 0.69 0.7 0.7 0.8 0.7  GFRNONAA 86* >90  --   --   --   --   GFRAA >90 >90  --   --   --   --     LIVER FUNCTION TESTS:  Recent Labs  03/22/14 1114 04/11/14 1006 05/02/14 1320 05/23/14 0842  BILITOT 0.36 0.37 0.50 0.43  AST 23 26 28 25   ALT 14 15 14 14   ALKPHOS 89 96 112 97  PROT 5.4* 5.8* 6.5 6.0*  ALBUMIN 2.9* 3.0* 3.4* 3.1*    TUMOR MARKERS: No results for input(s): AFPTM, CEA, CA199, CHROMGRNA in the last 8760 hours.  Assessment and Plan:  Prostate Ca to bone Elevated PSA-- now treated with chemotherapy Need PAC for treatments Pt and family aware of procedure benefits and risks including but not limited to: Infection; bleeding; vessel damage Agreeable to proceed Consent signed andin chart   Thank you for this interesting consult.  I greatly enjoyed meeting [name] [name] and look forward to participating in their care.  Signed: Kaithlyn Teagle A 06/08/2014,  10:48 AM   I spent a total of  20 Minutes   in face to face in clinical consultation, greater than 50% of which was counseling/coordinating care for [facility] placement

## 2014-06-08 NOTE — Discharge Instructions (Signed)

## 2014-06-08 NOTE — Procedures (Signed)
R IJ Port cathter placement with US and fluoroscopy No complication No blood loss. See complete dictation in [facility] PACS.  

## 2014-06-11 ENCOUNTER — Other Ambulatory Visit: Payer: Self-pay | Admitting: *Deleted

## 2014-06-11 MED ORDER — LIDOCAINE-PRILOCAINE 2.5-2.5 % EX CREA: 1.0000 "application " | TOPICAL_CREAM | CUTANEOUS | Status: DC | PRN

## 2014-06-13 ENCOUNTER — Ambulatory Visit (HOSPITAL_BASED_OUTPATIENT_CLINIC_OR_DEPARTMENT_OTHER): Payer: Medicare Other

## 2014-06-13 ENCOUNTER — Ambulatory Visit (HOSPITAL_BASED_OUTPATIENT_CLINIC_OR_DEPARTMENT_OTHER): Payer: Medicare Other | Admitting: Physician Assistant

## 2014-06-13 ENCOUNTER — Other Ambulatory Visit (HOSPITAL_BASED_OUTPATIENT_CLINIC_OR_DEPARTMENT_OTHER): Payer: Medicare Other

## 2014-06-13 ENCOUNTER — Telehealth: Payer: Self-pay | Admitting: Oncology

## 2014-06-13 ENCOUNTER — Encounter: Payer: Self-pay | Admitting: Physician Assistant

## 2014-06-13 VITALS — BP 126/58 | HR 94 | Temp 97.9°F | Resp 18 | Ht 64.0 in | Wt 146.6 lb

## 2014-06-13 DIAGNOSIS — C61 Malignant neoplasm of prostate: Secondary | ICD-10-CM

## 2014-06-13 DIAGNOSIS — C7951 Secondary malignant neoplasm of bone: Secondary | ICD-10-CM | POA: Diagnosis not present

## 2014-06-13 DIAGNOSIS — Z5111 Encounter for antineoplastic chemotherapy: Secondary | ICD-10-CM

## 2014-06-13 LAB — COMPREHENSIVE METABOLIC PANEL (CC13)
ALK PHOS: 107 U/L (ref 40–150)
ALT: 13 U/L (ref 0–55)
AST: 26 U/L (ref 5–34)
Albumin: 3.1 g/dL — ABNORMAL LOW (ref 3.5–5.0)
Anion Gap: 8 mEq/L (ref 3–11)
BILIRUBIN TOTAL: 0.38 mg/dL (ref 0.20–1.20)
BUN: 13.8 mg/dL (ref 7.0–26.0)
CO2: 30 mEq/L — ABNORMAL HIGH (ref 22–29)
Calcium: 9.4 mg/dL (ref 8.4–10.4)
Chloride: 104 mEq/L (ref 98–109)
Creatinine: 0.7 mg/dL (ref 0.7–1.3)
EGFR: >90 mL/min/{1.73_m2} (ref >90)
Glucose: 79 mg/dl (ref 70–140)
Potassium: 4.2 mEq/L (ref 3.5–5.1)
Sodium: 141 mEq/L (ref 136–145)
Total Protein: 6.1 g/dL — ABNORMAL LOW (ref 6.4–8.3)

## 2014-06-13 LAB — CBC WITH DIFFERENTIAL/PLATELET
BASO%: 2.3 % — ABNORMAL HIGH (ref 0.0–2.0)
BASOS ABS: 0.1 10*3/uL (ref 0.0–0.1)
EOS ABS: 0.1 10*3/uL (ref 0.0–0.5)
EOS%: 1.8 % (ref 0.0–7.0)
HCT: 36.3 % — ABNORMAL LOW (ref 38.4–49.9)
HEMOGLOBIN: 11.5 g/dL — AB (ref 13.0–17.1)
LYMPH%: 21.3 % (ref 14.0–49.0)
MCH: 29.8 pg (ref 27.2–33.4)
MCHC: 31.7 g/dL — ABNORMAL LOW (ref 32.0–36.0)
MCV: 94 fL (ref 79.3–98.0)
MONO#: 0.7 10*3/uL (ref 0.1–0.9)
MONO%: 12.4 % (ref 0.0–14.0)
NEUT%: 62.2 % (ref 39.0–75.0)
NEUTROS ABS: 3.5 10*3/uL (ref 1.5–6.5)
PLATELETS: 335 10*3/uL (ref 140–400)
RBC: 3.86 10*6/uL — ABNORMAL LOW (ref 4.20–5.82)
RDW: 17.5 % — AB (ref 11.0–14.6)
WBC: 5.6 10*3/uL (ref 4.0–10.3)
lymph#: 1.2 10*3/uL (ref 0.9–3.3)

## 2014-06-13 MED ORDER — HEPARIN SOD (PORK) LOCK FLUSH 100 UNIT/ML IV SOLN
500.0000 [IU] | Freq: Once | INTRAVENOUS | Status: AC | PRN
  Administered 2014-06-13: 500 [IU]
  Filled 2014-06-13: qty 5

## 2014-06-13 MED ORDER — SODIUM CHLORIDE 0.9 % IV SOLN
Freq: Once | INTRAVENOUS | Status: AC
  Administered 2014-06-13: 12:00:00 via INTRAVENOUS

## 2014-06-13 MED ORDER — ONDANSETRON 8 MG/NS 50 ML IVPB
INTRAVENOUS | Status: AC
  Filled 2014-06-13: qty 8

## 2014-06-13 MED ORDER — DEXAMETHASONE SODIUM PHOSPHATE 10 MG/ML IJ SOLN
INTRAMUSCULAR | Status: AC
  Filled 2014-06-13: qty 1

## 2014-06-13 MED ORDER — ONDANSETRON 8 MG/50ML IVPB (CHCC)
8.0000 mg | Freq: Once | INTRAVENOUS | Status: AC
  Administered 2014-06-13: 8 mg via INTRAVENOUS

## 2014-06-13 MED ORDER — SODIUM CHLORIDE 0.9 % IJ SOLN
10.0000 mL | INTRAMUSCULAR | Status: DC | PRN
Start: 2014-06-13 — End: 2015-12-04
  Administered 2014-06-13: 10 mL
  Filled 2014-06-13: qty 10

## 2014-06-13 MED ORDER — DEXAMETHASONE SODIUM PHOSPHATE 10 MG/ML IJ SOLN
10.0000 mg | Freq: Once | INTRAMUSCULAR | Status: AC
  Administered 2014-06-13: 10 mg via INTRAVENOUS

## 2014-06-13 MED ORDER — DOCETAXEL CHEMO INJECTION 160 MG/16ML
75.0000 mg/m2 | Freq: Once | INTRAVENOUS | Status: AC
  Administered 2014-06-13: 130 mg via INTRAVENOUS
  Filled 2014-06-13: qty 13

## 2014-06-13 NOTE — Patient Instructions (Signed)
[facility] Discharge Instructions for Patients Receiving Chemotherapy  Today you received the following chemotherapy agents: Taxotere. To help prevent nausea and vomiting after your treatment, we encourage you to take your nausea medication:  Compazine 10 mg every 6 hours as needed.   If you develop nausea and vomiting that is not controlled by your nausea medication, call the clinic.   BELOW ARE SYMPTOMS THAT SHOULD BE REPORTED IMMEDIATELY:  *FEVER GREATER THAN 100.5 F  *CHILLS WITH OR WITHOUT FEVER  NAUSEA AND VOMITING THAT IS NOT CONTROLLED WITH YOUR NAUSEA MEDICATION  *UNUSUAL SHORTNESS OF BREATH  *UNUSUAL BRUISING OR BLEEDING  TENDERNESS IN MOUTH AND THROAT WITH OR WITHOUT PRESENCE OF ULCERS  *URINARY PROBLEMS  *BOWEL PROBLEMS  UNUSUAL RASH Items with * indicate a potential emergency and should be followed up as soon as possible.  Feel free to call the clinic you have any questions or concerns. The clinic phone number is [phone].  Implanted Port Insertion, Care After Refer to this sheet in the next few weeks. These instructions provide you with information on caring for yourself after your procedure. Your health care provider may also give you more specific instructions. Your treatment has been planned according to current medical practices, but problems sometimes occur. Call your health care provider if you have any problems or questions after your procedure. WHAT TO EXPECT AFTER THE PROCEDURE After your procedure, it is typical to have the following:   Discomfort at the port insertion site. Ice packs to the area will help.  Bruising on the skin over the port. This will subside in 3-4 days. HOME CARE INSTRUCTIONS  After your port is placed, you will get a manufacturer's information card. The card has information about your port. Keep this card with you at all times.   Know what kind of port you have. There are many types of ports available.    Wear a medical alert bracelet in case of an emergency. This can help alert health care workers that you have a port.   The port can stay in for as long as your health care provider believes it is necessary.   A home health care nurse may give medicines and take care of the port.   You or a family member can get special training and directions for giving medicine and taking care of the port at home.  SEEK MEDICAL CARE IF:   Your port does not flush or you are unable to get a blood return.   You have a fever or chills. SEEK IMMEDIATE MEDICAL CARE IF:  You have new fluid or pus coming from your incision.   You notice a bad smell coming from your incision site.   You have swelling, pain, or more redness at the incision or port site.   You have chest pain or shortness of breath. Document Released: 01/25/2013 Document Revised: 04/11/2013 Document Reviewed: 01/25/2013 [facility] Patient Information 2015 [place], [place]. This information is not intended to replace advice given to you by your health care provider. Make sure you discuss any questions you have with your health care provider.

## 2014-06-13 NOTE — Progress Notes (Signed)
Hematology and Oncology Follow Up Visit  [name] [name] [id number] [birth year] 74 y.o. 06/13/2014 4:40 PM   Principle Diagnosis: 74 year old gentleman with castration resistant metastatic prostate cancer with disease to the bone. His initial diagnosis was in 2012 where he presented with metastatic disease to the rib. His PSA was 187.   Prior Therapy:  He was found to have a mass in the right axilla near the fourth rib encasing the right with some sclerosis. He underwent a right VATS procedure and resection of the third, fourth and fifth rib and reconstruction with titanium plates under the care of Dr. Arlyce Dice on November 13 2010.  He went on to receive androgen deprivation therapy under the care of Dr. Gaynelle Arabian. His PSA nadir was to 0.1 back in May of 2013 and subsequently started to rise.  He was treated with Provenge in the fall of 2014.  After his PSA went up to 178.6 and Xtandi was started in February of 2015 and discontinued in 09/2013 due to progression of disease and poor tolerance.  Current therapy: Taxotere chemotherapy started on 10/05/2013. He is here for cycle 13.  Interim History:  [name] [name] presents today for a followup visit accompanied by his wife. Since his last visit, he reports no new complaints. He recently had a Port-A-Cath placed on 06/08/2014. He would like his subsequent labs drawn from his Port-A-Cath. He continues to tolerate his chemotherapy without any major complications. He does report some occasional fatigue and exertional dyspnea but the symptoms have been relatively stable. He is able to perform most activities of daily living. He does not report any peripheral neuropathy but does report finger and toe nail changes. He continues to have excellent clinical benefit from systemic chemotherapy.  He did not report any nausea or vomiting. He does report some bone pain associated with Neulasta but otherwise no other complications. His pain has also improved dramatically  since the start of chemotherapy. His mobility and performance status remains the same at this time. He does not report any headaches or blurry vision or double vision. Has not reported any syncope or change in his mental status. He does have memory issues and is currently under evaluation for that. He is not reporting any chest pain or shortness of breath.  Does not report any palpitation or leg edema. Does not report any frequency urgency or hesitancy. Does not report any skeletal complications. Does not report any lymphadenopathy or petechiae. Remainder of his review of systems unremarkable.  Medications: I have reviewed the patient's current medications.  Current Outpatient Prescriptions  Medication Sig Dispense Refill  . acetaminophen (TYLENOL) 325 MG tablet Take 650 mg by mouth every 6 (six) hours as needed for moderate pain.     Marland Kitchen aspirin EC 81 MG tablet Take 81 mg by mouth at bedtime.    . Calcium Citrate-Vitamin D (CITRACAL + D PO) Take 3 tablets by mouth every morning.    . ferrous sulfate 325 (65 FE) MG tablet Take 325 mg by mouth daily with breakfast.    . lidocaine-prilocaine (EMLA) cream Apply 1 application topically as needed. 30 g 2  . mirtazapine (REMERON) 15 MG tablet Take 15 mg by mouth at bedtime.    . pantoprazole (PROTONIX) 40 MG tablet Take 1 tablet (40 mg total) by mouth 2 (two) times daily before a meal. 60 tablet 0  . prochlorperazine (COMPAZINE) 10 MG tablet Take 1 tablet (10 mg total) by mouth every 6 (six) hours as needed for nausea or  vomiting. 30 tablet 0  . simvastatin (ZOCOR) 80 MG tablet Take 80 mg by mouth at bedtime.       No current facility-administered medications for this visit.   Facility-Administered Medications Ordered in Other Visits  Medication Dose Route Frequency Provider Last Rate Last Dose  . sodium chloride 0.9 % injection 10 mL  10 mL Intracatheter PRN Wyatt Portela, MD   10 mL at 06/13/14 1341     Allergies:  Allergies  Allergen Reactions   . Oxycodone Nausea And Vomiting  . Penicillins Itching and Rash    Past Medical History, Surgical history, Social history, and Family History were reviewed and updated.   Physical Exam: Blood pressure 126/58, pulse 94, temperature 97.9 F (36.6 C), temperature source Oral, resp. rate 18, height 5\' 4"  (1.626 m), weight 146 lb 9.6 oz (66.497 kg), SpO2 96 %. ECOG:  1 General appearance: alert and cooperative not in any distress. Head: Normocephalic, without obvious abnormality Neck: no adenopathy Lymph nodes: Cervical, supraclavicular, and axillary nodes normal. Heart:regular rate and rhythm, S1, S2 normal, no murmur, click, rub or gallop Lung:chest clear, no wheezing, rales, normal symmetric air entry Abdomin: soft, non-tender, without masses or organomegaly EXT: trace edema bilaterally at the level of the ankle. Neurological: No deficits noted. Rigjt anterior chest Port a Cath in place. Incisions healing well, no evidence of infection. Nontender.  Lab Results: Lab Results  Component Value Date   WBC 5.6 06/13/2014   HGB 11.5* 06/13/2014   HCT 36.3* 06/13/2014   MCV 94.0 06/13/2014   PLT 335 06/13/2014     Chemistry      Component Value Date/Time   NA 141 06/13/2014 0917   NA 140 03/15/2014 0553   K 4.2 06/13/2014 0917   K 3.5* 03/15/2014 0553   CL 107 03/15/2014 0553   CO2 30* 06/13/2014 0917   CO2 23 03/15/2014 0553   BUN 13.8 06/13/2014 0917   BUN 30* 03/15/2014 0553   CREATININE 0.7 06/13/2014 0917   CREATININE 0.69 03/15/2014 0553      Component Value Date/Time   CALCIUM 9.4 06/13/2014 0917   CALCIUM 8.1* 03/15/2014 0553   ALKPHOS 107 06/13/2014 0917   ALKPHOS 82 03/15/2014 0553   AST 26 06/13/2014 0917   AST 16 03/15/2014 0553   ALT 13 06/13/2014 0917   ALT 8 03/15/2014 0553   BILITOT 0.38 06/13/2014 0917   BILITOT 0.6 03/15/2014 0553        Results for [name], [name] (MRN [id number]) as of 05/23/2014 09:04  Ref. Range 03/01/2014 09:42 03/22/2014  11:15 04/11/2014 10:05 05/02/2014 13:20  PSA Latest Range: <=4.00 ng/mL 0.98 0.73 0.66 0.71       Impression and Plan:  74 year old gentleman with the following issues:  1. 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 He is currently receiving Taxotere chemotherapy and ready to proceed with cycle 12. He tolerated chemotherapy well so far with excellent response in his PSA. His PSA is now down to 0.57 from 292. Dr. Alen Blew discussed the risks and benefits of continuing chemotherapy versus discontinuation. At this time, he does not need a treatment break and would like to continue. We see no cumulative side effects at this time that requires interruptions. We will continue with the current dose and schedule and we'll assess on a regular basis.  2. Bone pain: Improved with systemic chemotherapy.  3. Recurrent cough: Seems to be improving with Carafate and reflux medication.  4. Nausea prophylaxis:  He has antiemetics prescribed and has not needed it at this time.  5. IV access: Status post right anterior chest Port-A-Cath placement by interventional radiology on 06/08/2014. Further laboratory studies be drawn from the patient's Port-A-Cath.   6. Neutropenia prophylaxis: He will continue Neulasta after each chemotherapy cycle  7. Followup: Will be in 3 weeks before the next chemotherapy cycle.   Carlton Adam, [place]  2/24/20164:40 PM

## 2014-06-13 NOTE — Telephone Encounter (Signed)
Pt confirmed labs/ov per 02/24 POF, gave pt AVS.... KJ, added a flush

## 2014-06-14 ENCOUNTER — Ambulatory Visit (HOSPITAL_BASED_OUTPATIENT_CLINIC_OR_DEPARTMENT_OTHER): Payer: Medicare Other

## 2014-06-14 DIAGNOSIS — C61 Malignant neoplasm of prostate: Secondary | ICD-10-CM

## 2014-06-14 LAB — PSA: PSA: 0.57 ng/mL (ref <=4.00)

## 2014-06-14 MED ORDER — PEGFILGRASTIM INJECTION 6 MG/0.6ML ~~LOC~~
6.0000 mg | PREFILLED_SYRINGE | Freq: Once | SUBCUTANEOUS | Status: AC
  Administered 2014-06-14: 6 mg via SUBCUTANEOUS
  Filled 2014-06-14: qty 0.6

## 2014-06-16 NOTE — Patient Instructions (Signed)
Follow-up in 3 weeks

## 2014-06-20 ENCOUNTER — Encounter: Payer: Self-pay | Admitting: *Deleted

## 2014-06-28 ENCOUNTER — Encounter (INDEPENDENT_AMBULATORY_CARE_PROVIDER_SITE_OTHER): Payer: Self-pay | Admitting: Internal Medicine

## 2014-06-28 ENCOUNTER — Ambulatory Visit (INDEPENDENT_AMBULATORY_CARE_PROVIDER_SITE_OTHER): Payer: BLUE CROSS/BLUE SHIELD | Admitting: Internal Medicine

## 2014-06-28 VITALS — BP 98/50 | HR 64 | Temp 97.4°F | Ht 64.0 in | Wt 148.4 lb

## 2014-06-28 DIAGNOSIS — K922 Gastrointestinal hemorrhage, unspecified: Secondary | ICD-10-CM

## 2014-06-28 NOTE — Progress Notes (Signed)
Subjective:    Patient ID: [name] [name], male    DOB: 02-17-1941, 74 y.o.   MRN: [id number]  HPI Here today for f/u. Last seen in December. Hx of GI bleed in November. Admission hemoglobin 7.6 in November.  Taking Protonix BID. Low dose ASA. He is doing good for the most part. No GI problems. He has weakness.  No nausea or vomiting. He has a BM daily.  No melena or BRRB Sleeps in a recliner to prevent acid reducer. Takes Protonix BID Appetite is good. No weight loss. He has actually gained 4 pounds since his last visit.   History of metastatic prostate carcinoma who is receiving chemotherapy every 3 weeks .    He received two unit of PRBCs.while in the hospital.   He underwent an EGD (see below).   Last colonoscopy over 20 yrs ago in [place].   CBC    Component Value Date/Time   WBC 5.6 06/13/2014 0916   WBC 7.9 06/08/2014 0930   RBC 3.86* 06/13/2014 0916   RBC 3.87* 06/08/2014 0930   HGB 11.5* 06/13/2014 0916   HGB 11.7* 06/08/2014 0930   HCT 36.3* 06/13/2014 0916   HCT 36.5* 06/08/2014 0930   PLT 335 06/13/2014 0916   PLT 296 06/08/2014 0930   MCV 94.0 06/13/2014 0916   MCV 94.3 06/08/2014 0930   MCH 29.8 06/13/2014 0916   MCH 30.2 06/08/2014 0930   MCHC 31.7* 06/13/2014 0916   MCHC 32.1 06/08/2014 0930   RDW 17.5* 06/13/2014 0916   RDW 17.4* 06/08/2014 0930   LYMPHSABS 1.2 06/13/2014 0916   LYMPHSABS 1.2 06/08/2014 0930   MONOABS 0.7 06/13/2014 0916   MONOABS 0.6 06/08/2014 0930   EOSABS 0.1 06/13/2014 0916   EOSABS 0.1 06/08/2014 0930   BASOSABS 0.1 06/13/2014 0916   BASOSABS 0.1 06/08/2014 0930   CMP Latest Ref Rng 06/13/2014 05/23/2014 05/02/2014  Glucose 70 - 140 mg/dl 79 88 69(L)  BUN 7.0 - 26.0 mg/dL 13.8 11.7 13.7  Creatinine 0.7 - 1.3 mg/dL 0.7 0.7 0.8  Sodium 136 - 145 mEq/L 141 139 138  Potassium 3.5 - 5.1 mEq/L 4.2 4.1 3.6  Chloride 96 - 112 mEq/L - - -  CO2 22 - 29 mEq/L 30(H) 26 25  Calcium 8.4 - 10.4 mg/dL 9.4 8.5 8.5  Total Protein 6.4  - 8.3 g/dL 6.1(L) 6.0(L) 6.5  Total Bilirubin 0.20 - 1.20 mg/dL 0.38 0.43 0.50  Alkaline Phos 40 - 150 U/L 107 97 112  AST 5 - 34 U/L 26 25 28   ALT 0 - 55 U/L 13 14 14         Procedure Date: 03/14/2014 EGD Indications: Patient is 74 year old Caucasian male with history of metastatic prostate carcinoma who is undergoing chemotherapy every 3 weeks now presents with upper GI bleed and anemia. Hemoglobin has dropped from 11.6 g 2 weeks ago to 7.6. Platelet count and INR are normal. He has cough of several weeks duration but he denies heartburn dysphagia regurgitation or abdominal pain.  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 No active bleeding noted. Moderate-sized sliding hiatal hernia.   Review of Systems Past Medical History  Diagnosis Date  . Hyperlipidemia   . Prostate carcinoma   . 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 BURNEY  . Anxiety   . GERD (gastroesophageal  reflux disease)   . Tachycardia     history of  . Fatigue   . Hypertension     denies, has had hypotension    Past Surgical History  Procedure Laterality Date  . Resection bone tumor rib  [id number]    RT VAT , RESECTION OF 3rd,4th ,5th ribs with reconstruction with allograft and titanium platesDR. BURNEY  . Dental surgery    . Esophagogastroduodenoscopy N/A 03/14/2014    Procedure: ESOPHAGOGASTRODUODENOSCOPY (EGD);  Surgeon: Rogene Houston, MD;  Location: [facility];  Service: Endoscopy;  Laterality: N/A;    Allergies  Allergen Reactions  . Oxycodone Nausea And Vomiting  . Penicillins Itching and Rash    Current Outpatient Prescriptions on File Prior to Visit  Medication Sig Dispense Refill  . acetaminophen (TYLENOL) 325 MG tablet Take 650 mg by mouth every 6 (six) hours as needed for moderate pain.     Marland Kitchen  aspirin EC 81 MG tablet Take 81 mg by mouth at bedtime.    . Calcium Citrate-Vitamin D (CITRACAL + D PO) Take 3 tablets by mouth every morning.    . ferrous sulfate 325 (65 FE) MG tablet Take 325 mg by mouth daily with breakfast.    . lidocaine-prilocaine (EMLA) cream Apply 1 application topically as needed. 30 g 2  . mirtazapine (REMERON) 15 MG tablet Take 15 mg by mouth at bedtime.    . pantoprazole (PROTONIX) 40 MG tablet Take 1 tablet (40 mg total) by mouth 2 (two) times daily before a meal. 60 tablet 0  . prochlorperazine (COMPAZINE) 10 MG tablet Take 1 tablet (10 mg total) by mouth every 6 (six) hours as needed for nausea or vomiting. 30 tablet 0  . simvastatin (ZOCOR) 80 MG tablet Take 80 mg by mouth at bedtime.       Current Facility-Administered Medications on File Prior to Visit  Medication Dose Route Frequency Provider Last Rate Last Dose  . sodium chloride 0.9 % injection 10 mL  10 mL Intracatheter PRN Wyatt Portela, MD   10 mL at 06/13/14 1341        Objective:   Physical ExamBlood pressure 98/50, pulse 64, temperature 97.4 F (36.3 C), height 5\' 4"  (1.626 m), weight 148 lb 6.4 oz (67.314 kg).  Alert and oriented. Skin warm and dry. Oral mucosa is moist.   . Sclera anicteric, conjunctivae is pink. Thyroid not enlarged. No cervical lymphadenopathy. Lungs clear. Heart regular rate and rhythm.  Abdomen is soft. Bowel sounds are positive. No hepatomegaly. No abdominal masses felt. No tenderness.  No edema to lower extremities.         Assessment & Plan:  GI bleed. Doing much better. Hemoglobin is stable. Continue the Protonix BID OV in 6 months.

## 2014-06-28 NOTE — Patient Instructions (Signed)
OV in 6 months. 

## 2014-07-04 ENCOUNTER — Ambulatory Visit (HOSPITAL_BASED_OUTPATIENT_CLINIC_OR_DEPARTMENT_OTHER): Payer: Medicare Other | Admitting: Oncology

## 2014-07-04 ENCOUNTER — Telehealth: Payer: Self-pay | Admitting: *Deleted

## 2014-07-04 ENCOUNTER — Ambulatory Visit: Payer: Medicare Other

## 2014-07-04 ENCOUNTER — Telehealth: Payer: Self-pay | Admitting: Oncology

## 2014-07-04 ENCOUNTER — Ambulatory Visit (HOSPITAL_BASED_OUTPATIENT_CLINIC_OR_DEPARTMENT_OTHER): Payer: Medicare Other

## 2014-07-04 ENCOUNTER — Other Ambulatory Visit (HOSPITAL_BASED_OUTPATIENT_CLINIC_OR_DEPARTMENT_OTHER): Payer: Medicare Other

## 2014-07-04 VITALS — BP 107/62 | HR 92 | Temp 98.1°F | Resp 17 | Ht 64.0 in | Wt 145.7 lb

## 2014-07-04 DIAGNOSIS — Z5111 Encounter for antineoplastic chemotherapy: Secondary | ICD-10-CM

## 2014-07-04 DIAGNOSIS — C61 Malignant neoplasm of prostate: Secondary | ICD-10-CM

## 2014-07-04 DIAGNOSIS — Z95828 Presence of other vascular implants and grafts: Secondary | ICD-10-CM

## 2014-07-04 DIAGNOSIS — C7951 Secondary malignant neoplasm of bone: Secondary | ICD-10-CM

## 2014-07-04 LAB — COMPREHENSIVE METABOLIC PANEL (CC13)
ALT: 15 U/L (ref 0–55)
ANION GAP: 7 meq/L (ref 3–11)
AST: 26 U/L (ref 5–34)
Albumin: 2.9 g/dL — ABNORMAL LOW (ref 3.5–5.0)
Alkaline Phosphatase: 97 U/L (ref 40–150)
BUN: 12.6 mg/dL (ref 7.0–26.0)
CALCIUM: 8.4 mg/dL (ref 8.4–10.4)
CHLORIDE: 104 meq/L (ref 98–109)
CO2: 29 meq/L (ref 22–29)
CREATININE: 0.8 mg/dL (ref 0.7–1.3)
EGFR: >90 mL/min/{1.73_m2} (ref >90)
GLUCOSE: 102 mg/dL (ref 70–140)
Potassium: 4.1 mEq/L (ref 3.5–5.1)
Sodium: 139 mEq/L (ref 136–145)
Total Bilirubin: 0.4 mg/dL (ref 0.20–1.20)
Total Protein: 5.8 g/dL — ABNORMAL LOW (ref 6.4–8.3)

## 2014-07-04 LAB — CBC WITH DIFFERENTIAL/PLATELET
BASO%: 0.2 % (ref 0.0–2.0)
Basophils Absolute: 0 10*3/uL (ref 0.0–0.1)
EOS ABS: 0.1 10*3/uL (ref 0.0–0.5)
EOS%: 1.3 % (ref 0.0–7.0)
HCT: 34.3 % — ABNORMAL LOW (ref 38.4–49.9)
HGB: 10.9 g/dL — ABNORMAL LOW (ref 13.0–17.1)
LYMPH#: 1.2 10*3/uL (ref 0.9–3.3)
LYMPH%: 16.7 % (ref 14.0–49.0)
MCH: 29.6 pg (ref 27.2–33.4)
MCHC: 31.8 g/dL — ABNORMAL LOW (ref 32.0–36.0)
MCV: 93.1 fL (ref 79.3–98.0)
MONO#: 0.9 10*3/uL (ref 0.1–0.9)
MONO%: 13 % (ref 0.0–14.0)
NEUT#: 4.8 10*3/uL (ref 1.5–6.5)
NEUT%: 68.8 % (ref 39.0–75.0)
Platelets: 348 10*3/uL (ref 140–400)
RBC: 3.69 10*6/uL — AB (ref 4.20–5.82)
RDW: 17.8 % — AB (ref 11.0–14.6)
WBC: 6.9 10*3/uL (ref 4.0–10.3)

## 2014-07-04 MED ORDER — HEPARIN SOD (PORK) LOCK FLUSH 100 UNIT/ML IV SOLN
500.0000 [IU] | Freq: Once | INTRAVENOUS | Status: AC | PRN
  Administered 2014-07-04: 500 [IU]
  Filled 2014-07-04: qty 5

## 2014-07-04 MED ORDER — SODIUM CHLORIDE 0.9 % IJ SOLN
10.0000 mL | INTRAMUSCULAR | Status: DC | PRN
  Administered 2014-07-04: 10 mL via INTRAVENOUS
  Filled 2014-07-04: qty 10

## 2014-07-04 MED ORDER — DOCETAXEL CHEMO INJECTION 160 MG/16ML
75.0000 mg/m2 | Freq: Once | INTRAVENOUS | Status: AC
  Administered 2014-07-04: 130 mg via INTRAVENOUS
  Filled 2014-07-04: qty 13

## 2014-07-04 MED ORDER — SODIUM CHLORIDE 0.9 % IV SOLN
Freq: Once | INTRAVENOUS | Status: AC
  Administered 2014-07-04: 13:00:00 via INTRAVENOUS
  Filled 2014-07-04: qty 4

## 2014-07-04 MED ORDER — SODIUM CHLORIDE 0.9 % IV SOLN
Freq: Once | INTRAVENOUS | Status: AC
  Administered 2014-07-04: 13:00:00 via INTRAVENOUS

## 2014-07-04 MED ORDER — SODIUM CHLORIDE 0.9 % IJ SOLN
10.0000 mL | INTRAMUSCULAR | Status: DC | PRN
  Administered 2014-07-04: 10 mL
  Filled 2014-07-04: qty 10

## 2014-07-04 NOTE — Telephone Encounter (Signed)
Gave avs & calendar for April. Sent message to schedule treatment. °

## 2014-07-04 NOTE — Progress Notes (Signed)
Hematology and Oncology Follow Up Visit  [name] [name] [id number] 1940/11/26 74 y.o. 07/04/2014 11:40 AM   Principle Diagnosis: 74 year old gentleman with castration resistant metastatic prostate cancer with disease to the bone. His initial diagnosis was in 2012 where he presented with metastatic disease to the rib. His PSA was 187.   Prior Therapy:  He was found to have a mass in the right axilla near the fourth rib encasing the right with some sclerosis. He underwent a right VATS procedure and resection of the third, fourth and fifth rib and reconstruction with titanium plates under the care of Dr. Arlyce [name] on November 13 2010.  He went on to receive androgen deprivation therapy under the care of Dr. Gaynelle [name]. His PSA nadir was to 0.1 back in May of 2013 and subsequently started to rise.  He was treated with Provenge in the fall of 2014.  After his PSA went up to 178.6 and Xtandi was started in February of 2015 and discontinued in 09/2013 due to progression of disease and poor tolerance.  Current therapy: Taxotere chemotherapy started on 10/05/2013. He is here for cycle 14.  Interim History:  [name] [name] presents today for a followup visit accompanied by his wife. Since his last visit, he is doing well.  He continues to tolerate his chemotherapy without any major complications. He does report some occasional fatigue and exertional dyspnea but the symptoms have been relatively stable. He is able to perform most activities of daily living. He does not report any peripheral neuropathy but does report finger and toe nail changes which is stable.  He continues to have excellent clinical benefit from systemic chemotherapy.  He did not report any nausea or vomiting. He does report some bone pain associated with Neulasta but otherwise no other complications. His pain has also improved dramatically since the start of chemotherapy. His mobility and performance status remains the same at this time. He does not  report any headaches or blurry vision or double vision. Has not reported any syncope or change in his mental status. He does have memory issues and is currently under evaluation for that. He is not reporting any chest pain or shortness of breath.  Does not report any palpitation or leg edema. Does not report any frequency urgency or hesitancy. Does not report any skeletal complications. Does not report any lymphadenopathy or petechiae. Remainder of his review of systems unremarkable.  Medications: I have reviewed the patient's current medications.  Current Outpatient Prescriptions  Medication Sig Dispense Refill  . acetaminophen (TYLENOL) 325 MG tablet Take 650 mg by mouth every 6 (six) hours as needed for moderate pain.     Marland Kitchen aspirin EC 81 MG tablet Take 81 mg by mouth at bedtime.    . Calcium Citrate-Vitamin D (CITRACAL + D PO) Take 3 tablets by mouth every morning.    . ferrous sulfate 325 (65 FE) MG tablet Take 325 mg by mouth daily with breakfast.    . lidocaine-prilocaine (EMLA) cream Apply 1 application topically as needed. 30 g 2  . mirtazapine (REMERON) 15 MG tablet Take 15 mg by mouth at bedtime.    . pantoprazole (PROTONIX) 40 MG tablet Take 1 tablet (40 mg total) by mouth 2 (two) times daily before a meal. 60 tablet 0  . prochlorperazine (COMPAZINE) 10 MG tablet Take 1 tablet (10 mg total) by mouth every 6 (six) hours as needed for nausea or vomiting. 30 tablet 0  . simvastatin (ZOCOR) 80 MG tablet Take 80 mg  by mouth at bedtime.       No current facility-administered medications for this visit.   Facility-Administered Medications Ordered in Other Visits  Medication Dose Route Frequency Provider Last Rate Last Dose  . sodium chloride 0.9 % injection 10 mL  10 mL Intracatheter PRN [name] Portela, MD   10 mL at 06/13/14 1341     Allergies:  Allergies  Allergen Reactions  . Oxycodone Nausea And Vomiting  . Penicillins Itching and Rash    Past Medical History, Surgical history,  Social history, and Family History were reviewed and updated.   Physical Exam: Blood pressure 107/62, pulse 92, temperature 98.1 F (36.7 C), temperature source Oral, resp. rate 17, height 5\' 4"  (1.626 m), weight 145 lb 11.2 oz (66.089 kg), SpO2 95 %. ECOG:  1 General appearance: alert and cooperative not in any distress. Head: Normocephalic, without obvious abnormality Neck: no adenopathy Lymph nodes: Cervical, supraclavicular, and axillary nodes normal. Heart:regular rate and rhythm, S1, S2 normal, no murmur, click, rub or gallop Lung:chest clear, no wheezing, rales, normal symmetric air entry Abdomin: soft, non-tender, without masses or organomegaly EXT: trace edema bilaterally at the level of the ankle. Neurological: No deficits noted. Rigjt anterior chest Port a Cath in place. Incisions well healed.   Lab Results: Lab Results  Component Value Date   WBC 6.9 07/04/2014   HGB 10.9* 07/04/2014   HCT 34.3* 07/04/2014   MCV 93.1 07/04/2014   PLT 348 07/04/2014     Chemistry      Component Value Date/Time   NA 139 07/04/2014 1045   NA 140 03/15/2014 0553   K 4.1 07/04/2014 1045   K 3.5* 03/15/2014 0553   CL 107 03/15/2014 0553   CO2 29 07/04/2014 1045   CO2 23 03/15/2014 0553   BUN 12.6 07/04/2014 1045   BUN 30* 03/15/2014 0553   CREATININE 0.8 07/04/2014 1045   CREATININE 0.69 03/15/2014 0553      Component Value Date/Time   CALCIUM 8.4 07/04/2014 1045   CALCIUM 8.1* 03/15/2014 0553   ALKPHOS 97 07/04/2014 1045   ALKPHOS 82 03/15/2014 0553   AST 26 07/04/2014 1045   AST 16 03/15/2014 0553   ALT 15 07/04/2014 1045   ALT 8 03/15/2014 0553   BILITOT 0.40 07/04/2014 1045   BILITOT 0.6 03/15/2014 0553       Results for [name] [name] (MRN [id number]) as of 07/04/2014 11:28  Ref. Range 05/02/2014 13:20 05/23/2014 08:42 06/13/2014 09:17  PSA Latest Range: <=4.00 ng/mL 0.71 0.58 0.57         Impression and Plan:  74 year old gentleman with the following  issues:  1. 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 He is currently receiving Taxotere chemotherapy and ready to proceed with cycle 12. He tolerated chemotherapy well so far with excellent response in his PSA. His PSA is now down to 0.57 from 292.   I continued to discuss risks and benefits of continuing chemotherapy versus discontinuation. At this time, he does not need a treatment break and would like to continue. I continued to educate him about the cumulative side effects at this time that requires interruptions. We will continue with the current dose and schedule and we'll assess on a regular basis.  2. Bone pain: Improved with systemic chemotherapy.  3. Nausea prophylaxis: He has antiemetics prescribed and has not needed it at this time.  4. IV access: Status post right anterior chest Port-A-Cath placement by interventional radiology on  06/08/2014. Further laboratory studies be drawn from the patient's Port-A-Cath. No complications.   5. Neutropenia prophylaxis: He will continue Neulasta after each chemotherapy cycle  6. Followup: Will be in 3 weeks before the next chemotherapy cycle.   [facility], MD 3/16/201611:40 AM

## 2014-07-04 NOTE — Telephone Encounter (Signed)
Per staff message and POF I have scheduled appts. Advised scheduler of appts. JMW  

## 2014-07-04 NOTE — Patient Instructions (Signed)
[facility] Discharge Instructions for Patients Receiving Chemotherapy  Today you received the following chemotherapy agents taxotere   To help prevent nausea and vomiting after your treatment, we encourage you to take your nausea medication as directed   If you develop nausea and vomiting that is not controlled by your nausea medication, call the clinic.   BELOW ARE SYMPTOMS THAT SHOULD BE REPORTED IMMEDIATELY:  *FEVER GREATER THAN 100.5 F  *CHILLS WITH OR WITHOUT FEVER  NAUSEA AND VOMITING THAT IS NOT CONTROLLED WITH YOUR NAUSEA MEDICATION  *UNUSUAL SHORTNESS OF BREATH  *UNUSUAL BRUISING OR BLEEDING  TENDERNESS IN MOUTH AND THROAT WITH OR WITHOUT PRESENCE OF ULCERS  *URINARY PROBLEMS  *BOWEL PROBLEMS  UNUSUAL RASH Items with * indicate a potential emergency and should be followed up as soon as possible.  Feel free to call the clinic you have any questions or concerns. The clinic phone number is [phone].  

## 2014-07-04 NOTE — Patient Instructions (Signed)

## 2014-07-05 ENCOUNTER — Ambulatory Visit (HOSPITAL_BASED_OUTPATIENT_CLINIC_OR_DEPARTMENT_OTHER): Payer: Medicare Other

## 2014-07-05 DIAGNOSIS — C7951 Secondary malignant neoplasm of bone: Secondary | ICD-10-CM

## 2014-07-05 DIAGNOSIS — Z5189 Encounter for other specified aftercare: Secondary | ICD-10-CM

## 2014-07-05 DIAGNOSIS — C61 Malignant neoplasm of prostate: Secondary | ICD-10-CM

## 2014-07-05 LAB — PSA: PSA: 0.48 ng/mL (ref <=4.00)

## 2014-07-05 MED ORDER — PEGFILGRASTIM INJECTION 6 MG/0.6ML ~~LOC~~
6.0000 mg | PREFILLED_SYRINGE | Freq: Once | SUBCUTANEOUS | Status: AC
  Administered 2014-07-05: 6 mg via SUBCUTANEOUS
  Filled 2014-07-05: qty 0.6

## 2014-07-25 ENCOUNTER — Encounter: Payer: Self-pay | Admitting: Physician Assistant

## 2014-07-25 ENCOUNTER — Other Ambulatory Visit (HOSPITAL_BASED_OUTPATIENT_CLINIC_OR_DEPARTMENT_OTHER): Payer: Medicare Other

## 2014-07-25 ENCOUNTER — Ambulatory Visit: Payer: Medicare Other

## 2014-07-25 ENCOUNTER — Ambulatory Visit (HOSPITAL_BASED_OUTPATIENT_CLINIC_OR_DEPARTMENT_OTHER): Payer: Medicare Other

## 2014-07-25 ENCOUNTER — Ambulatory Visit (HOSPITAL_BASED_OUTPATIENT_CLINIC_OR_DEPARTMENT_OTHER): Payer: Medicare Other | Admitting: Physician Assistant

## 2014-07-25 VITALS — BP 112/67 | HR 90 | Temp 97.4°F | Resp 19 | Ht 64.0 in | Wt 143.6 lb

## 2014-07-25 DIAGNOSIS — C61 Malignant neoplasm of prostate: Secondary | ICD-10-CM

## 2014-07-25 DIAGNOSIS — Z95828 Presence of other vascular implants and grafts: Secondary | ICD-10-CM

## 2014-07-25 DIAGNOSIS — R630 Anorexia: Secondary | ICD-10-CM | POA: Diagnosis not present

## 2014-07-25 DIAGNOSIS — F329 Major depressive disorder, single episode, unspecified: Secondary | ICD-10-CM

## 2014-07-25 DIAGNOSIS — Z5111 Encounter for antineoplastic chemotherapy: Secondary | ICD-10-CM | POA: Diagnosis not present

## 2014-07-25 DIAGNOSIS — C7951 Secondary malignant neoplasm of bone: Secondary | ICD-10-CM

## 2014-07-25 LAB — CBC WITH DIFFERENTIAL/PLATELET
BASO%: 1.3 % (ref 0.0–2.0)
Basophils Absolute: 0.1 10*3/uL (ref 0.0–0.1)
EOS%: 0.7 % (ref 0.0–7.0)
Eosinophils Absolute: 0 10*3/uL (ref 0.0–0.5)
HCT: 34.6 % — ABNORMAL LOW (ref 38.4–49.9)
HGB: 11 g/dL — ABNORMAL LOW (ref 13.0–17.1)
LYMPH#: 1.2 10*3/uL (ref 0.9–3.3)
LYMPH%: 18 % (ref 14.0–49.0)
MCH: 29.5 pg (ref 27.2–33.4)
MCHC: 31.7 g/dL — ABNORMAL LOW (ref 32.0–36.0)
MCV: 93 fL (ref 79.3–98.0)
MONO#: 0.8 10*3/uL (ref 0.1–0.9)
MONO%: 11.8 % (ref 0.0–14.0)
NEUT%: 68.2 % (ref 39.0–75.0)
NEUTROS ABS: 4.6 10*3/uL (ref 1.5–6.5)
PLATELETS: 316 10*3/uL (ref 140–400)
RBC: 3.73 10*6/uL — AB (ref 4.20–5.82)
RDW: 17 % — ABNORMAL HIGH (ref 11.0–14.6)
WBC: 6.7 10*3/uL (ref 4.0–10.3)

## 2014-07-25 LAB — COMPREHENSIVE METABOLIC PANEL (CC13)
ALT: 12 U/L (ref 0–55)
AST: 21 U/L (ref 5–34)
Albumin: 3 g/dL — ABNORMAL LOW (ref 3.5–5.0)
Alkaline Phosphatase: 103 U/L (ref 40–150)
Anion Gap: 9 mEq/L (ref 3–11)
BUN: 13.2 mg/dL (ref 7.0–26.0)
CO2: 27 mEq/L (ref 22–29)
CREATININE: 0.7 mg/dL (ref 0.7–1.3)
Calcium: 8.6 mg/dL (ref 8.4–10.4)
Chloride: 104 mEq/L (ref 98–109)
EGFR: >90 mL/min/{1.73_m2} (ref >90)
Glucose: 117 mg/dl (ref 70–140)
Potassium: 4 mEq/L (ref 3.5–5.1)
Sodium: 139 mEq/L (ref 136–145)
Total Bilirubin: 0.46 mg/dL (ref 0.20–1.20)
Total Protein: 5.9 g/dL — ABNORMAL LOW (ref 6.4–8.3)

## 2014-07-25 MED ORDER — MIRTAZAPINE 15 MG PO TABS: 15.0000 mg | ORAL_TABLET | Freq: Every day | ORAL | Status: DC

## 2014-07-25 MED ORDER — SODIUM CHLORIDE 0.9 % IV SOLN
Freq: Once | INTRAVENOUS | Status: AC
  Administered 2014-07-25: 15:00:00 via INTRAVENOUS

## 2014-07-25 MED ORDER — SODIUM CHLORIDE 0.9 % IJ SOLN
10.0000 mL | INTRAMUSCULAR | Status: DC | PRN
  Administered 2014-07-25: 10 mL
  Filled 2014-07-25: qty 10

## 2014-07-25 MED ORDER — SODIUM CHLORIDE 0.9 % IV SOLN
Freq: Once | INTRAVENOUS | Status: AC
  Administered 2014-07-25: 16:00:00 via INTRAVENOUS
  Filled 2014-07-25: qty 4

## 2014-07-25 MED ORDER — SODIUM CHLORIDE 0.9 % IJ SOLN
10.0000 mL | INTRAMUSCULAR | Status: DC | PRN
  Administered 2014-07-25: 10 mL via INTRAVENOUS
  Filled 2014-07-25: qty 10

## 2014-07-25 MED ORDER — HEPARIN SOD (PORK) LOCK FLUSH 100 UNIT/ML IV SOLN
500.0000 [IU] | Freq: Once | INTRAVENOUS | Status: AC | PRN
  Administered 2014-07-25: 500 [IU]
  Filled 2014-07-25: qty 5

## 2014-07-25 MED ORDER — DOCETAXEL CHEMO INJECTION 160 MG/16ML
75.0000 mg/m2 | Freq: Once | INTRAVENOUS | Status: AC
  Administered 2014-07-25: 130 mg via INTRAVENOUS
  Filled 2014-07-25: qty 13

## 2014-07-25 NOTE — Progress Notes (Signed)
Hematology and Oncology Follow Up Visit  [name] [name] [id number] October 23, 1940 74 y.o. 07/25/2014 3:23 PM   Principle Diagnosis: 74 year old gentleman with castration resistant metastatic prostate cancer with disease to the bone. His initial diagnosis was in 2012 where he presented with metastatic disease to the rib. His PSA was 187.   Prior Therapy:  He was found to have a mass in the right axilla near the fourth rib encasing the right with some sclerosis. He underwent a right VATS procedure and resection of the third, fourth and fifth rib and reconstruction with titanium plates under the care of Dr. Arlyce Dice on November 13 2010.  He went on to receive androgen deprivation therapy under the care of Dr. Gaynelle Arabian. His PSA nadir was to 0.1 back in May of 2013 and subsequently started to rise.  He was treated with Provenge in the fall of 2014.  After his PSA went up to 178.6 and Xtandi was started in February of 2015 and discontinued in 09/2013 due to progression of disease and poor tolerance.  Current therapy: Taxotere chemotherapy started on 10/05/2013. He is here for cycle 155.  Interim History:  [name] [name] presents today for a followup visit accompanied by his wife. Since his last visit, he is doing well.  He continues to tolerate his chemotherapy without any major complications. He does report some occasional fatigue and exertional dyspnea but the symptoms have been relatively stable. He is able to perform most activities of daily living. He requests a refill for Remeron. He was eating better when he was taking it. He does not report any peripheral neuropathy but does report finger and toe nail changes which is stable.  He continues to have excellent clinical benefit from systemic chemotherapy.  He did not report any nausea or vomiting. He does report some bone pain associated with Neulasta but otherwise no other complications. His pain has also improved dramatically since the start of chemotherapy.  His mobility and performance status remains the same at this time. He does not report any headaches or blurry vision or double vision. Has not reported any syncope or change in his mental status. He does have memory issues and is currently under evaluation for that. He is not reporting any chest pain or shortness of breath.  Does not report any palpitation or leg edema. Does not report any frequency urgency or hesitancy. Does not report any skeletal complications. Does not report any lymphadenopathy or petechiae. Remainder of his review of systems unremarkable.  Medications: I have reviewed the patient's current medications.  Current Outpatient Prescriptions  Medication Sig Dispense Refill  . acetaminophen (TYLENOL) 325 MG tablet Take 650 mg by mouth every 6 (six) hours as needed for moderate pain.     Marland Kitchen aspirin EC 81 MG tablet Take 81 mg by mouth at bedtime.    . Calcium Citrate-Vitamin D (CITRACAL + D PO) Take 3 tablets by mouth every morning.    . ferrous sulfate 325 (65 FE) MG tablet Take 325 mg by mouth daily with breakfast.    . lidocaine-prilocaine (EMLA) cream Apply 1 application topically as needed. 30 g 2  . pantoprazole (PROTONIX) 40 MG tablet Take 1 tablet (40 mg total) by mouth 2 (two) times daily before a meal. 60 tablet 0  . prochlorperazine (COMPAZINE) 10 MG tablet Take 1 tablet (10 mg total) by mouth every 6 (six) hours as needed for nausea or vomiting. 30 tablet 0  . simvastatin (ZOCOR) 80 MG tablet Take 80 mg by  mouth at bedtime.      . mirtazapine (REMERON) 15 MG tablet Take 1 tablet (15 mg total) by mouth at bedtime. 30 tablet 0   No current facility-administered medications for this visit.   Facility-Administered Medications Ordered in Other Visits  Medication Dose Route Frequency Provider Last Rate Last Dose  . sodium chloride 0.9 % injection 10 mL  10 mL Intracatheter PRN Wyatt Portela, MD   10 mL at 06/13/14 1341     Allergies:  Allergies  Allergen Reactions  .  Oxycodone Nausea And Vomiting  . Penicillins Itching and Rash    Past Medical History, Surgical history, Social history, and Family History were reviewed and updated.   Physical Exam: Blood pressure 112/67, pulse 90, temperature 97.4 F (36.3 C), temperature source Oral, resp. rate 19, height 5\' 4"  (1.626 m), weight 143 lb 9.6 oz (65.137 kg), SpO2 100 %. ECOG:  1 General appearance: alert and cooperative not in any distress. Head: Normocephalic, without obvious abnormality Neck: no adenopathy Lymph nodes: Cervical, supraclavicular, and axillary nodes normal. Heart:regular rate and rhythm, S1, S2 normal, no murmur, click, rub or gallop Lung:chest clear, no wheezing, rales, normal symmetric air entry Abdomin: soft, non-tender, without masses or organomegaly EXT: trace edema bilaterally at the level of the ankle. Neurological: No deficits noted. Rigjt anterior chest Port a Cath in place. Incisions well healed.   Lab Results: Lab Results  Component Value Date   WBC 6.7 07/25/2014   HGB 11.0* 07/25/2014   HCT 34.6* 07/25/2014   MCV 93.0 07/25/2014   PLT 316 07/25/2014     Chemistry      Component Value Date/Time   NA 139 07/25/2014 1301   NA 140 03/15/2014 0553   K 4.0 07/25/2014 1301   K 3.5* 03/15/2014 0553   CL 107 03/15/2014 0553   CO2 27 07/25/2014 1301   CO2 23 03/15/2014 0553   BUN 13.2 07/25/2014 1301   BUN 30* 03/15/2014 0553   CREATININE 0.7 07/25/2014 1301   CREATININE 0.69 03/15/2014 0553      Component Value Date/Time   CALCIUM 8.6 07/25/2014 1301   CALCIUM 8.1* 03/15/2014 0553   ALKPHOS 103 07/25/2014 1301   ALKPHOS 82 03/15/2014 0553   AST 21 07/25/2014 1301   AST 16 03/15/2014 0553   ALT 12 07/25/2014 1301   ALT 8 03/15/2014 0553   BILITOT 0.46 07/25/2014 1301   BILITOT 0.6 03/15/2014 0553       Results for [name] [name] (MRN [id number]) as of 07/04/2014 11:28  Ref. Range 05/02/2014 13:20 05/23/2014 08:42 06/13/2014 09:17  PSA Latest Range:  <=4.00 ng/mL 0.71 0.58 0.57         Impression and Plan:  74 year old gentleman with the following issues:  1. 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 He is currently receiving Taxotere chemotherapy and ready to proceed with cycle 12. He tolerated chemotherapy well so far with excellent response in his PSA. His PSA is now down to 0.47 from 292.   For now we will continue with the current dose and schedule and we'll assess on a regular basis.  2. Bone pain: Improved with systemic chemotherapy.  3. Nausea prophylaxis: He has antiemetics prescribed and has not needed it at this time.  4. IV access: Status post right anterior chest Port-A-Cath placement by interventional radiology on 06/08/2014. Further laboratory studies be drawn from the patient's Port-A-Cath. No complications.   5. Neutropenia prophylaxis: He will continue Neulasta after  each chemotherapy cycle  6. Poor appetite/Depression: Will restart Remeron 15 mg by mouth at bedtime. A prescription was sent to his pharmacy of record via [facility].  6. Followup: Will be in 3 weeks before the next chemotherapy cycle.   Carlton Adam, PA-C  4/6/20163:23 PM

## 2014-07-25 NOTE — Patient Instructions (Signed)

## 2014-07-25 NOTE — Patient Instructions (Signed)
[facility] Discharge Instructions for Patients Receiving Chemotherapy  Today you received the following chemotherapy agents Taxotere.  To help prevent nausea and vomiting after your treatment, we encourage you to take your nausea medication as prescribed.   If you develop nausea and vomiting that is not controlled by your nausea medication, call the clinic.   BELOW ARE SYMPTOMS THAT SHOULD BE REPORTED IMMEDIATELY:  *FEVER GREATER THAN 100.5 F  *CHILLS WITH OR WITHOUT FEVER  NAUSEA AND VOMITING THAT IS NOT CONTROLLED WITH YOUR NAUSEA MEDICATION  *UNUSUAL SHORTNESS OF BREATH  *UNUSUAL BRUISING OR BLEEDING  TENDERNESS IN MOUTH AND THROAT WITH OR WITHOUT PRESENCE OF ULCERS  *URINARY PROBLEMS  *BOWEL PROBLEMS  UNUSUAL RASH Items with * indicate a potential emergency and should be followed up as soon as possible.  Feel free to call the clinic you have any questions or concerns. The clinic phone number is [phone].  Please show the CHEMO ALERT CARD at check-in to the Emergency Department and triage nurse.   

## 2014-07-26 LAB — PSA: PSA: 0.47 ng/mL

## 2014-07-27 ENCOUNTER — Ambulatory Visit (HOSPITAL_BASED_OUTPATIENT_CLINIC_OR_DEPARTMENT_OTHER): Payer: Medicare Other

## 2014-07-27 DIAGNOSIS — C7951 Secondary malignant neoplasm of bone: Secondary | ICD-10-CM

## 2014-07-27 DIAGNOSIS — C61 Malignant neoplasm of prostate: Secondary | ICD-10-CM

## 2014-07-27 MED ORDER — PEGFILGRASTIM INJECTION 6 MG/0.6ML ~~LOC~~
6.0000 mg | PREFILLED_SYRINGE | Freq: Once | SUBCUTANEOUS | Status: AC
Start: 2014-07-27 — End: 2014-07-27
  Administered 2014-07-27: 6 mg via SUBCUTANEOUS
  Filled 2014-07-27: qty 0.6

## 2014-07-29 NOTE — Patient Instructions (Signed)
Take Remeron 15 mg by mouth at bedtime Follow up in 3 weeks, prior to your next scheduled cycle of chemotherapy

## 2014-08-15 ENCOUNTER — Ambulatory Visit (HOSPITAL_BASED_OUTPATIENT_CLINIC_OR_DEPARTMENT_OTHER): Payer: Medicare Other

## 2014-08-15 ENCOUNTER — Other Ambulatory Visit: Payer: Self-pay | Admitting: *Deleted

## 2014-08-15 ENCOUNTER — Encounter: Payer: Self-pay | Admitting: Skilled Nursing Facility1

## 2014-08-15 ENCOUNTER — Other Ambulatory Visit (HOSPITAL_BASED_OUTPATIENT_CLINIC_OR_DEPARTMENT_OTHER): Payer: Medicare Other

## 2014-08-15 ENCOUNTER — Telehealth: Payer: Self-pay | Admitting: Oncology

## 2014-08-15 ENCOUNTER — Ambulatory Visit (HOSPITAL_BASED_OUTPATIENT_CLINIC_OR_DEPARTMENT_OTHER): Payer: Medicare Other | Admitting: Oncology

## 2014-08-15 ENCOUNTER — Ambulatory Visit: Payer: Medicare Other

## 2014-08-15 VITALS — BP 115/67 | HR 90 | Temp 97.6°F | Resp 18 | Ht 64.0 in | Wt 144.0 lb

## 2014-08-15 DIAGNOSIS — R630 Anorexia: Secondary | ICD-10-CM

## 2014-08-15 DIAGNOSIS — M898X9 Other specified disorders of bone, unspecified site: Secondary | ICD-10-CM

## 2014-08-15 DIAGNOSIS — Z5111 Encounter for antineoplastic chemotherapy: Secondary | ICD-10-CM

## 2014-08-15 DIAGNOSIS — C61 Malignant neoplasm of prostate: Secondary | ICD-10-CM

## 2014-08-15 DIAGNOSIS — C7951 Secondary malignant neoplasm of bone: Secondary | ICD-10-CM

## 2014-08-15 DIAGNOSIS — Z95828 Presence of other vascular implants and grafts: Secondary | ICD-10-CM

## 2014-08-15 DIAGNOSIS — F329 Major depressive disorder, single episode, unspecified: Secondary | ICD-10-CM

## 2014-08-15 LAB — CBC WITH DIFFERENTIAL/PLATELET
BASO%: 1.1 % (ref 0.0–2.0)
Basophils Absolute: 0.1 10*3/uL (ref 0.0–0.1)
EOS ABS: 0.1 10*3/uL (ref 0.0–0.5)
EOS%: 1.4 % (ref 0.0–7.0)
HCT: 34.5 % — ABNORMAL LOW (ref 38.4–49.9)
HGB: 11 g/dL — ABNORMAL LOW (ref 13.0–17.1)
LYMPH#: 1.4 10*3/uL (ref 0.9–3.3)
LYMPH%: 19.4 % (ref 14.0–49.0)
MCH: 29.7 pg (ref 27.2–33.4)
MCHC: 32 g/dL (ref 32.0–36.0)
MCV: 93 fL (ref 79.3–98.0)
MONO#: 0.9 10*3/uL (ref 0.1–0.9)
MONO%: 12.7 % (ref 0.0–14.0)
NEUT%: 65.4 % (ref 39.0–75.0)
NEUTROS ABS: 4.7 10*3/uL (ref 1.5–6.5)
PLATELETS: 361 10*3/uL (ref 140–400)
RBC: 3.71 10*6/uL — AB (ref 4.20–5.82)
RDW: 16.8 % — AB (ref 11.0–14.6)
WBC: 7.2 10*3/uL (ref 4.0–10.3)

## 2014-08-15 LAB — COMPREHENSIVE METABOLIC PANEL (CC13)
ALK PHOS: 105 U/L (ref 40–150)
ALT: 16 U/L (ref 0–55)
AST: 28 U/L (ref 5–34)
Albumin: 2.9 g/dL — ABNORMAL LOW (ref 3.5–5.0)
Anion Gap: 7 mEq/L (ref 3–11)
BUN: 10.5 mg/dL (ref 7.0–26.0)
CALCIUM: 8.7 mg/dL (ref 8.4–10.4)
CHLORIDE: 104 meq/L (ref 98–109)
CO2: 28 mEq/L (ref 22–29)
CREATININE: 0.7 mg/dL (ref 0.7–1.3)
EGFR: >90 mL/min/{1.73_m2} (ref >90)
Glucose: 91 mg/dl (ref 70–140)
Potassium: 4.1 mEq/L (ref 3.5–5.1)
Sodium: 139 mEq/L (ref 136–145)
Total Bilirubin: 0.38 mg/dL (ref 0.20–1.20)
Total Protein: 5.7 g/dL — ABNORMAL LOW (ref 6.4–8.3)

## 2014-08-15 MED ORDER — SODIUM CHLORIDE 0.9 % IJ SOLN
10.0000 mL | INTRAMUSCULAR | Status: DC | PRN
  Administered 2014-08-15: 10 mL via INTRAVENOUS
  Filled 2014-08-15: qty 10

## 2014-08-15 MED ORDER — HEPARIN SOD (PORK) LOCK FLUSH 100 UNIT/ML IV SOLN
500.0000 [IU] | Freq: Once | INTRAVENOUS | Status: AC | PRN
  Administered 2014-08-15: 500 [IU]
  Filled 2014-08-15: qty 5

## 2014-08-15 MED ORDER — MIRTAZAPINE 15 MG PO TABS: 15.0000 mg | ORAL_TABLET | Freq: Every day | ORAL | Status: DC

## 2014-08-15 MED ORDER — DOCETAXEL CHEMO INJECTION 160 MG/16ML
75.0000 mg/m2 | Freq: Once | INTRAVENOUS | Status: AC
  Administered 2014-08-15: 130 mg via INTRAVENOUS
  Filled 2014-08-15: qty 13

## 2014-08-15 MED ORDER — SODIUM CHLORIDE 0.9 % IV SOLN
Freq: Once | INTRAVENOUS | Status: AC
  Administered 2014-08-15: 14:00:00 via INTRAVENOUS
  Filled 2014-08-15: qty 4

## 2014-08-15 MED ORDER — SODIUM CHLORIDE 0.9 % IJ SOLN
10.0000 mL | INTRAMUSCULAR | Status: DC | PRN
  Administered 2014-08-15: 10 mL
  Filled 2014-08-15: qty 10

## 2014-08-15 MED ORDER — SODIUM CHLORIDE 0.9 % IV SOLN
Freq: Once | INTRAVENOUS | Status: AC
  Administered 2014-08-15: 14:00:00 via INTRAVENOUS

## 2014-08-15 NOTE — Patient Instructions (Signed)

## 2014-08-15 NOTE — Telephone Encounter (Signed)
Pt confirmed labs/ov per 04/27 POF, gave pt AVS and Calendar..... KJ  °

## 2014-08-15 NOTE — Progress Notes (Signed)
Subjective:     Patient ID: [name] [name], male   DOB: [birth year], 74 y.o.   MRN: [id number]  HPI   Review of Systems     Objective:   Physical Exam To the pt in identifing some dietary strategies for gaining some wt that has been lost.    Assessment:     Pt contacted via telephone [phone]) but was in chemo. An unidentified male answered the phone and stated the pt was in chemo and did not want to take down the dietitians contact information for the pt to contact at a later date.    Plan:     No plan identified at this time.

## 2014-08-15 NOTE — Progress Notes (Signed)
Hematology and Oncology Follow Up Visit  [name] [name] [id number] [birth year] 74 y.o. 08/15/2014 1:04 PM   Principle Diagnosis: 74 year old gentleman with castration resistant metastatic prostate cancer with disease to the bone. His initial diagnosis was in 2012 where he presented with metastatic disease to the rib. His PSA was 187.   Prior Therapy:  He was found to have a mass in the right axilla near the fourth rib encasing the right with some sclerosis. He underwent a right VATS procedure and resection of the third, fourth and fifth rib and reconstruction with titanium plates under the care of Dr. Arlyce [name] on November 13 2010.  He went on to receive androgen deprivation therapy under the care of Dr. Gaynelle [name]. His PSA nadir was to 0.1 back in May of 2013 and subsequently started to rise.  He was treated with Provenge in the fall of 2014.  After his PSA went up to 178.6 and Xtandi was started in February of 2015 and discontinued in 09/2013 due to progression of disease and poor tolerance.  Current therapy: Taxotere chemotherapy started on 10/05/2013. He is here for cycle 16.  Interim History:  [name] [name] presents today for a followup visit accompanied by his wife. Since his last visit, he has reported more fatigue and tiredness.  He continues to tolerate his chemotherapy without any major complications. He has reported slight decline in his exercise tolerance which is relatively new to have He is able to perform most activities of daily living but he has noticed he has to stop to rest more often. He is eating better and his weight is stable.     He did not report any nausea or vomiting. He does report some bone pain associated with Neulasta but otherwise no other complications. His pain has also improved dramatically since the start of chemotherapy. His mobility and performance status remains the same at this time. He does not report any headaches or blurry vision or double vision. Has not reported  any syncope or change in his mental status. He does have memory issues and is currently under evaluation for that. He is not reporting any chest pain or shortness of breath.  Does not report any palpitation or leg edema. Does not report any frequency urgency or hesitancy. Does not report any skeletal complications. Does not report any lymphadenopathy or petechiae. Remainder of his review of systems unremarkable.  Medications: I have reviewed the patient's current medications.  Current Outpatient Prescriptions  Medication Sig Dispense Refill  . acetaminophen (TYLENOL) 325 MG tablet Take 650 mg by mouth every 6 (six) hours as needed for moderate pain.     Marland Kitchen aspirin EC 81 MG tablet Take 81 mg by mouth at bedtime.    . Calcium Citrate-Vitamin D (CITRACAL + D PO) Take 3 tablets by mouth every morning.    . ferrous sulfate 325 (65 FE) MG tablet Take 325 mg by mouth daily with breakfast.    . lidocaine-prilocaine (EMLA) cream Apply 1 application topically as needed. 30 g 2  . pantoprazole (PROTONIX) 40 MG tablet Take 1 tablet (40 mg total) by mouth 2 (two) times daily before a meal. 60 tablet 0  . prochlorperazine (COMPAZINE) 10 MG tablet Take 1 tablet (10 mg total) by mouth every 6 (six) hours as needed for nausea or vomiting. 30 tablet 0  . simvastatin (ZOCOR) 80 MG tablet Take 80 mg by mouth at bedtime.      . mirtazapine (REMERON) 15 MG tablet Take 1 tablet (15  mg total) by mouth at bedtime. 30 tablet 1   No current facility-administered medications for this visit.   Facility-Administered Medications Ordered in Other Visits  Medication Dose Route Frequency Provider Last Rate Last Dose  . sodium chloride 0.9 % injection 10 mL  10 mL Intracatheter PRN [name] Portela, MD   10 mL at 06/13/14 1341     Allergies:  Allergies  Allergen Reactions  . Oxycodone Nausea And Vomiting  . Penicillins Itching and Rash    Past Medical History, Surgical history, Social history, and Family History were  reviewed and updated.   Physical Exam: Blood pressure 115/67, pulse 90, temperature 97.6 F (36.4 C), temperature source Oral, resp. rate 18, height 5\' 4"  (1.626 m), weight 144 lb (65.318 kg), SpO2 97 %. ECOG:  1 General appearance: alert and cooperative not in any distress. Head: Normocephalic, without obvious abnormality Neck: no adenopathy Lymph nodes: Cervical, supraclavicular, and axillary nodes normal. Heart:regular rate and rhythm, S1, S2 normal, no murmur, click, rub or gallop Lung:chest clear, no wheezing, rales, normal symmetric air entry Abdomin: soft, non-tender, without masses or organomegaly EXT: trace edema bilaterally at the level of the ankle. Neurological: No deficits noted. Rigjt anterior chest Port a Cath in place. Incisions well healed.   Lab Results: Lab Results  Component Value Date   WBC 7.2 08/15/2014   HGB 11.0* 08/15/2014   HCT 34.5* 08/15/2014   MCV 93.0 08/15/2014   PLT 361 08/15/2014     Chemistry      Component Value Date/Time   NA 139 07/25/2014 1301   NA 140 03/15/2014 0553   K 4.0 07/25/2014 1301   K 3.5* 03/15/2014 0553   CL 107 03/15/2014 0553   CO2 27 07/25/2014 1301   CO2 23 03/15/2014 0553   BUN 13.2 07/25/2014 1301   BUN 30* 03/15/2014 0553   CREATININE 0.7 07/25/2014 1301   CREATININE 0.69 03/15/2014 0553      Component Value Date/Time   CALCIUM 8.6 07/25/2014 1301   CALCIUM 8.1* 03/15/2014 0553   ALKPHOS 103 07/25/2014 1301   ALKPHOS 82 03/15/2014 0553   AST 21 07/25/2014 1301   AST 16 03/15/2014 0553   ALT 12 07/25/2014 1301   ALT 8 03/15/2014 0553   BILITOT 0.46 07/25/2014 1301   BILITOT 0.6 03/15/2014 0553         Results for [name] [name] (MRN [id number]) as of 08/15/2014 12:49  Ref. Range 06/13/2014 09:17 07/04/2014 10:45 07/25/2014 13:02  PSA Latest Ref Range: <=4.00 ng/mL 0.57 0.48 0.47        Impression and Plan:  74 year old gentleman with the following issues:  1. Castration resistant metastatic  prostate cancer with metastatic disease to the bone. He is currently receiving Taxotere chemotherapy and have tolerated it well with an excellent response with the PSA dropping down to 0.47 from 292.  He is experiencing more complications predominantly increased fatigue and tiredness and decline in his performance status. For this reason, we'll proceed with chemotherapy today and will give him a treatment holiday at least for the next 6-9 weeks. We will consider restarting chemotherapy if his symptoms improve or his PSA starts to rise rapidly.  2. Bone pain: Improved with systemic chemotherapy.  3. Nausea prophylaxis: He has antiemetics prescribed and has not needed it at this time.  4. IV access: Status post right anterior chest Port-A-Cath placement by interventional radiology on 06/08/2014. No complications at this time.  5. Neutropenia prophylaxis: He will continue Neulasta after  each chemotherapy cycle  6. Poor appetite/Depression: Will restart Remeron 15 mg by mouth at bedtime. She is refilled for him today.  7. Followup: Will be in 6 weeks for a follow-up.   [facility], MD  4/27/20161:04 PM

## 2014-08-15 NOTE — Patient Instructions (Signed)
[facility] Discharge Instructions for Patients Receiving Chemotherapy  Today you received the following chemotherapy agents:  Taxotere.  To help prevent nausea and vomiting after your treatment, we encourage you to take your nausea medication as directed.   If you develop nausea and vomiting that is not controlled by your nausea medication, call the clinic.   BELOW ARE SYMPTOMS THAT SHOULD BE REPORTED IMMEDIATELY:  *FEVER GREATER THAN 100.5 F  *CHILLS WITH OR WITHOUT FEVER  NAUSEA AND VOMITING THAT IS NOT CONTROLLED WITH YOUR NAUSEA MEDICATION  *UNUSUAL SHORTNESS OF BREATH  *UNUSUAL BRUISING OR BLEEDING  TENDERNESS IN MOUTH AND THROAT WITH OR WITHOUT PRESENCE OF ULCERS  *URINARY PROBLEMS  *BOWEL PROBLEMS  UNUSUAL RASH Items with * indicate a potential emergency and should be followed up as soon as possible.  Feel free to call the clinic you have any questions or concerns. The clinic phone number is [phone].  Please show the CHEMO ALERT CARD at check-in to the Emergency Department and triage nurse.  

## 2014-08-16 LAB — PSA: PSA: 0.44 ng/mL (ref <=4.00)

## 2014-08-17 ENCOUNTER — Ambulatory Visit (HOSPITAL_BASED_OUTPATIENT_CLINIC_OR_DEPARTMENT_OTHER): Payer: Medicare Other

## 2014-08-17 VITALS — BP 115/76 | HR 102 | Temp 97.9°F

## 2014-08-17 DIAGNOSIS — Z5189 Encounter for other specified aftercare: Secondary | ICD-10-CM

## 2014-08-17 DIAGNOSIS — C61 Malignant neoplasm of prostate: Secondary | ICD-10-CM

## 2014-08-17 DIAGNOSIS — C7951 Secondary malignant neoplasm of bone: Secondary | ICD-10-CM

## 2014-08-17 MED ORDER — PEGFILGRASTIM INJECTION 6 MG/0.6ML ~~LOC~~
6.0000 mg | PREFILLED_SYRINGE | Freq: Once | SUBCUTANEOUS | Status: AC
  Administered 2014-08-17: 6 mg via SUBCUTANEOUS
  Filled 2014-08-17: qty 0.6

## 2014-08-21 ENCOUNTER — Telehealth (INDEPENDENT_AMBULATORY_CARE_PROVIDER_SITE_OTHER): Payer: Self-pay | Admitting: *Deleted

## 2014-08-21 MED ORDER — PANTOPRAZOLE SODIUM 40 MG PO TBEC: 40.0000 mg | DELAYED_RELEASE_TABLET | Freq: Two times a day (BID) | ORAL | Status: DC

## 2014-08-21 NOTE — Telephone Encounter (Signed)
[name] [name] is needing a refill on his PROTONIX. Please send a Rx to [facility].

## 2014-08-21 NOTE — Telephone Encounter (Signed)
Rx sent 

## 2014-09-27 ENCOUNTER — Ambulatory Visit (HOSPITAL_BASED_OUTPATIENT_CLINIC_OR_DEPARTMENT_OTHER): Payer: Medicare Other | Admitting: Oncology

## 2014-09-27 ENCOUNTER — Other Ambulatory Visit (HOSPITAL_BASED_OUTPATIENT_CLINIC_OR_DEPARTMENT_OTHER): Payer: Medicare Other

## 2014-09-27 ENCOUNTER — Ambulatory Visit (HOSPITAL_BASED_OUTPATIENT_CLINIC_OR_DEPARTMENT_OTHER): Payer: Medicare Other

## 2014-09-27 VITALS — BP 104/63 | HR 96 | Temp 98.0°F | Resp 18 | Ht 64.0 in | Wt 141.3 lb

## 2014-09-27 DIAGNOSIS — C7951 Secondary malignant neoplasm of bone: Secondary | ICD-10-CM

## 2014-09-27 DIAGNOSIS — C61 Malignant neoplasm of prostate: Secondary | ICD-10-CM

## 2014-09-27 DIAGNOSIS — Z95828 Presence of other vascular implants and grafts: Secondary | ICD-10-CM

## 2014-09-27 LAB — CBC WITH DIFFERENTIAL/PLATELET
BASO%: 2 % (ref 0.0–2.0)
Basophils Absolute: 0.1 10*3/uL (ref 0.0–0.1)
EOS%: 6.7 % (ref 0.0–7.0)
Eosinophils Absolute: 0.4 10*3/uL (ref 0.0–0.5)
HCT: 33.9 % — ABNORMAL LOW (ref 38.4–49.9)
HGB: 11.2 g/dL — ABNORMAL LOW (ref 13.0–17.1)
LYMPH%: 24 % (ref 14.0–49.0)
MCH: 30.8 pg (ref 27.2–33.4)
MCHC: 33 g/dL (ref 32.0–36.0)
MCV: 93.4 fL (ref 79.3–98.0)
MONO#: 0.5 10*3/uL (ref 0.1–0.9)
MONO%: 10 % (ref 0.0–14.0)
NEUT#: 3.1 10*3/uL (ref 1.5–6.5)
NEUT%: 57.3 % (ref 39.0–75.0)
Platelets: 219 10*3/uL (ref 140–400)
RBC: 3.63 10*6/uL — AB (ref 4.20–5.82)
RDW: 15.4 % — AB (ref 11.0–14.6)
WBC: 5.4 10*3/uL (ref 4.0–10.3)
lymph#: 1.3 10*3/uL (ref 0.9–3.3)

## 2014-09-27 LAB — COMPREHENSIVE METABOLIC PANEL (CC13)
ALK PHOS: 111 U/L (ref 40–150)
ALT: 16 U/L (ref 0–55)
AST: 30 U/L (ref 5–34)
Albumin: 3 g/dL — ABNORMAL LOW (ref 3.5–5.0)
Anion Gap: 6 mEq/L (ref 3–11)
BUN: 17.3 mg/dL (ref 7.0–26.0)
CO2: 28 mEq/L (ref 22–29)
CREATININE: 0.8 mg/dL (ref 0.7–1.3)
Calcium: 8.6 mg/dL (ref 8.4–10.4)
Chloride: 103 mEq/L (ref 98–109)
Glucose: 87 mg/dl (ref 70–140)
Potassium: 4.4 mEq/L (ref 3.5–5.1)
Sodium: 136 mEq/L (ref 136–145)
Total Bilirubin: 0.59 mg/dL (ref 0.20–1.20)
Total Protein: 6 g/dL — ABNORMAL LOW (ref 6.4–8.3)

## 2014-09-27 MED ORDER — HEPARIN SOD (PORK) LOCK FLUSH 100 UNIT/ML IV SOLN
500.0000 [IU] | Freq: Once | INTRAVENOUS | Status: AC
  Administered 2014-09-27: 500 [IU] via INTRAVENOUS
  Filled 2014-09-27: qty 5

## 2014-09-27 MED ORDER — SODIUM CHLORIDE 0.9 % IJ SOLN
10.0000 mL | INTRAMUSCULAR | Status: DC | PRN
  Administered 2014-09-27: 10 mL via INTRAVENOUS
  Filled 2014-09-27: qty 10

## 2014-09-27 NOTE — Progress Notes (Signed)
Hematology and Oncology Follow Up Visit  [name] [name] [id number] 1940-12-16 74 y.o. 09/27/2014 2:01 PM   Principle Diagnosis: 74 year old gentleman with castration resistant metastatic prostate cancer with disease to the bone. His initial diagnosis was in 2012 where he presented with metastatic disease to the rib. His PSA was 187.   Prior Therapy:  He was found to have a mass in the right axilla near the fourth rib encasing the right with some sclerosis. He underwent a right VATS procedure and resection of the third, fourth and fifth rib and reconstruction with titanium plates under the care of Dr. Arlyce Dice on November 13 2010.  He went on to receive androgen deprivation therapy under the care of Dr. Gaynelle Arabian. His PSA nadir was to 0.1 back in May of 2013 and subsequently started to rise.  He was treated with Provenge in the fall of 2014.  After his PSA went up to 178.6 and Xtandi was started in February of 2015 and discontinued in 09/2013 due to progression of disease and poor tolerance. Taxotere chemotherapy started on 10/05/2013. He is status post 16 cycles of therapy last one given in April 2016 with a PSA reduction down to 0.44.   Current therapy: Taxotere treatment holiday with observation and surveillance.  Interim History:  [name] [name] presents today for a followup visit accompanied by his wife. Since his last visit, he has been off chemotherapy for 6 weeks and have done well. He does not report increased in his back pain or any bone pain. His energy slightly improved and his appetite is stable. He does not report any worsening neuropathy or any deterioration in his health. He continues to enjoy a reasonable performance status at this time.    He did not report any nausea or vomiting. His mobility and performance status remains the same at this time. He does not report any headaches or blurry vision or double vision. Has not reported any syncope or change in his mental status. He is not  reporting any chest pain or shortness of breath.  Does not report any palpitation or leg edema. Does not report any frequency urgency or hesitancy. Does not report any skeletal complications. Does not report any lymphadenopathy or petechiae. Remainder of his review of systems unremarkable.  Medications: I have reviewed the patient's current medications.  Current Outpatient Prescriptions  Medication Sig Dispense Refill  . acetaminophen (TYLENOL) 325 MG tablet Take 650 mg by mouth every 6 (six) hours as needed for moderate pain.     Marland Kitchen aspirin EC 81 MG tablet Take 81 mg by mouth at bedtime.    . Calcium Citrate-Vitamin D (CITRACAL + D PO) Take 3 tablets by mouth every morning.    . ferrous sulfate 325 (65 FE) MG tablet Take 325 mg by mouth daily with breakfast.    . lidocaine-prilocaine (EMLA) cream Apply 1 application topically as needed. 30 g 2  . mirtazapine (REMERON) 15 MG tablet Take 1 tablet (15 mg total) by mouth at bedtime. 30 tablet 1  . pantoprazole (PROTONIX) 40 MG tablet Take 1 tablet (40 mg total) by mouth 2 (two) times daily before a meal. 60 tablet 5  . prochlorperazine (COMPAZINE) 10 MG tablet Take 1 tablet (10 mg total) by mouth every 6 (six) hours as needed for nausea or vomiting. 30 tablet 0  . simvastatin (ZOCOR) 40 MG tablet Take 40 mg by mouth daily.     No current facility-administered medications for this visit.   Facility-Administered Medications Ordered in  Other Visits  Medication Dose Route Frequency Provider Last Rate Last Dose  . sodium chloride 0.9 % injection 10 mL  10 mL Intracatheter PRN Wyatt Portela, MD   10 mL at 06/13/14 1341     Allergies:  Allergies  Allergen Reactions  . Oxycodone Nausea And Vomiting  . Penicillins Itching and Rash    Past Medical History, Surgical history, Social history, and Family History were reviewed and updated.   Physical Exam: Blood pressure 104/63, pulse 96, temperature 98 F (36.7 C), resp. rate 18, height 5\' 4"   (1.626 m), weight 141 lb 4.8 oz (64.093 kg), SpO2 98 %. ECOG:  1 General appearance: alert and cooperative not in any distress. Head: Normocephalic, without obvious abnormality Neck: no adenopathy Lymph nodes: Cervical, supraclavicular, and axillary nodes normal. Heart:regular rate and rhythm, S1, S2 normal, no murmur, click, rub or gallop Lung:chest clear, no wheezing, rales, normal symmetric air entry Abdomin: soft, non-tender, without masses or organomegaly EXT: trace edema bilaterally at the level of the ankle. Neurological: No deficits noted. Right anterior chest Port a Cath in place. Incisions well healed.   Lab Results: Lab Results  Component Value Date   WBC 5.4 09/27/2014   HGB 11.2* 09/27/2014   HCT 33.9* 09/27/2014   MCV 93.4 09/27/2014   PLT 219 09/27/2014     Chemistry      Component Value Date/Time   NA 139 08/15/2014 1222   NA 140 03/15/2014 0553   K 4.1 08/15/2014 1222   K 3.5* 03/15/2014 0553   CL 107 03/15/2014 0553   CO2 28 08/15/2014 1222   CO2 23 03/15/2014 0553   BUN 10.5 08/15/2014 1222   BUN 30* 03/15/2014 0553   CREATININE 0.7 08/15/2014 1222   CREATININE 0.69 03/15/2014 0553      Component Value Date/Time   CALCIUM 8.7 08/15/2014 1222   CALCIUM 8.1* 03/15/2014 0553   ALKPHOS 105 08/15/2014 1222   ALKPHOS 82 03/15/2014 0553   AST 28 08/15/2014 1222   AST 16 03/15/2014 0553   ALT 16 08/15/2014 1222   ALT 8 03/15/2014 0553   BILITOT 0.38 08/15/2014 1222   BILITOT 0.6 03/15/2014 0553        Results for [name] [name] (MRN [id number]) as of 09/27/2014 13:49  Ref. Range 07/25/2014 13:02 08/15/2014 12:22  PSA Latest Ref Range: <=4.00 ng/mL 0.47 0.44          Impression and Plan:  74 year old gentleman with the following issues:  1. 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 After progressing on Xtandi, he received systemic chemotherapy in the form of Taxotere between June 2015 till April  2016 for a total of 16 cycles. His PSA dropped down to 0.44 from 176.  He is currently on treatment holiday due to the accumulation of Taxotere side effects. He is feeling better at this time and the plan is to continue with the treatment holiday and monitor his PSA periodically.  We plan to resume his chemotherapy as soon as he develops progression of disease with a rapidly rising PSA or symptomatology.  2. Bone pain: Improved with systemic chemotherapy. No relapse of his pain at this time.  3. Nausea prophylaxis: He has antiemetics prescribed and has not needed it at this time.  4. IV access: Status post right anterior chest Port-A-Cath placement. This will be flushed every 6 weeks of chemotherapy.  5. Neutropenia prophylaxis: Neulasta will be on hold for the restart of chemotherapy.  6.  Poor appetite/Depression: This has improved off chemotherapy.  7. Followup: Will be in 6 weeks for a follow-up.   [facility], MD  6/9/20162:01 PM

## 2014-09-27 NOTE — Patient Instructions (Signed)

## 2014-09-28 LAB — PSA: PSA: 0.46 ng/mL (ref <=4.00)

## 2014-09-28 NOTE — Progress Notes (Signed)
Spoke with patient's wife. Gave results of PSA.

## 2014-10-30 ENCOUNTER — Other Ambulatory Visit: Payer: Self-pay | Admitting: Oncology

## 2014-11-02 NOTE — Telephone Encounter (Signed)
This request for remeron refill  is on dr shadad's desk for his approval.

## 2014-11-06 ENCOUNTER — Other Ambulatory Visit: Payer: Self-pay | Admitting: *Deleted

## 2014-11-06 DIAGNOSIS — C61 Malignant neoplasm of prostate: Secondary | ICD-10-CM

## 2014-11-06 DIAGNOSIS — C7951 Secondary malignant neoplasm of bone: Secondary | ICD-10-CM

## 2014-11-06 MED ORDER — MIRTAZAPINE 15 MG PO TABS: 15.0000 mg | ORAL_TABLET | Freq: Every day | ORAL | Status: DC

## 2014-11-13 ENCOUNTER — Other Ambulatory Visit (HOSPITAL_BASED_OUTPATIENT_CLINIC_OR_DEPARTMENT_OTHER): Payer: Medicare Other

## 2014-11-13 ENCOUNTER — Ambulatory Visit (HOSPITAL_BASED_OUTPATIENT_CLINIC_OR_DEPARTMENT_OTHER): Payer: Medicare Other | Admitting: Oncology

## 2014-11-13 ENCOUNTER — Ambulatory Visit (HOSPITAL_BASED_OUTPATIENT_CLINIC_OR_DEPARTMENT_OTHER): Payer: Medicare Other

## 2014-11-13 ENCOUNTER — Telehealth: Payer: Self-pay | Admitting: Oncology

## 2014-11-13 VITALS — BP 102/73 | HR 86 | Temp 98.0°F | Resp 18 | Ht 64.0 in | Wt 135.0 lb

## 2014-11-13 DIAGNOSIS — C61 Malignant neoplasm of prostate: Secondary | ICD-10-CM

## 2014-11-13 DIAGNOSIS — Z95828 Presence of other vascular implants and grafts: Secondary | ICD-10-CM

## 2014-11-13 DIAGNOSIS — C7951 Secondary malignant neoplasm of bone: Secondary | ICD-10-CM

## 2014-11-13 DIAGNOSIS — F329 Major depressive disorder, single episode, unspecified: Secondary | ICD-10-CM | POA: Diagnosis not present

## 2014-11-13 DIAGNOSIS — R630 Anorexia: Secondary | ICD-10-CM | POA: Diagnosis not present

## 2014-11-13 LAB — COMPREHENSIVE METABOLIC PANEL (CC13)
ALBUMIN: 3.2 g/dL — AB (ref 3.5–5.0)
ALT: 17 U/L (ref 0–55)
ANION GAP: 6 meq/L (ref 3–11)
AST: 31 U/L (ref 5–34)
Alkaline Phosphatase: 120 U/L (ref 40–150)
BUN: 18.8 mg/dL (ref 7.0–26.0)
CALCIUM: 9.4 mg/dL (ref 8.4–10.4)
CO2: 28 mEq/L (ref 22–29)
Chloride: 102 mEq/L (ref 98–109)
Creatinine: 0.9 mg/dL (ref 0.7–1.3)
EGFR: 83 mL/min/{1.73_m2} — ABNORMAL LOW (ref >90)
Glucose: 89 mg/dl (ref 70–140)
Potassium: 4.6 mEq/L (ref 3.5–5.1)
Sodium: 136 mEq/L (ref 136–145)
TOTAL PROTEIN: 6.8 g/dL (ref 6.4–8.3)
Total Bilirubin: 0.48 mg/dL (ref 0.20–1.20)

## 2014-11-13 LAB — CBC WITH DIFFERENTIAL/PLATELET
BASO%: 1.2 % (ref 0.0–2.0)
Basophils Absolute: 0.1 10*3/uL (ref 0.0–0.1)
EOS%: 4.1 % (ref 0.0–7.0)
Eosinophils Absolute: 0.3 10*3/uL (ref 0.0–0.5)
HCT: 36.7 % — ABNORMAL LOW (ref 38.4–49.9)
HGB: 12.2 g/dL — ABNORMAL LOW (ref 13.0–17.1)
LYMPH%: 58.9 % — ABNORMAL HIGH (ref 14.0–49.0)
MCH: 30.5 pg (ref 27.2–33.4)
MCHC: 33.3 g/dL (ref 32.0–36.0)
MCV: 91.6 fL (ref 79.3–98.0)
MONO#: 0.5 10*3/uL (ref 0.1–0.9)
MONO%: 6.9 % (ref 0.0–14.0)
NEUT#: 2.2 10*3/uL (ref 1.5–6.5)
NEUT%: 28.9 % — ABNORMAL LOW (ref 39.0–75.0)
Platelets: 247 10*3/uL (ref 140–400)
RBC: 4.01 10*6/uL — AB (ref 4.20–5.82)
RDW: 13.8 % (ref 11.0–14.6)
WBC: 7.6 10*3/uL (ref 4.0–10.3)
lymph#: 4.5 10*3/uL — ABNORMAL HIGH (ref 0.9–3.3)

## 2014-11-13 MED ORDER — HEPARIN SOD (PORK) LOCK FLUSH 100 UNIT/ML IV SOLN
500.0000 [IU] | Freq: Once | INTRAVENOUS | Status: AC
  Administered 2014-11-13: 500 [IU] via INTRAVENOUS
  Filled 2014-11-13: qty 5

## 2014-11-13 MED ORDER — SODIUM CHLORIDE 0.9 % IJ SOLN
10.0000 mL | INTRAMUSCULAR | Status: DC | PRN
  Administered 2014-11-13: 10 mL via INTRAVENOUS
  Filled 2014-11-13: qty 10

## 2014-11-13 NOTE — Patient Instructions (Signed)

## 2014-11-13 NOTE — Telephone Encounter (Signed)
Gave and printed appt sched and avs for pt for Sept °

## 2014-11-13 NOTE — Progress Notes (Signed)
Hematology and Oncology Follow Up Visit  Fountain Derusha [id number] [birth year] 74 y.o. 11/13/2014 11:31 AM   Principle Diagnosis: 74 year old gentleman with castration resistant metastatic prostate cancer with disease to the bone. His initial diagnosis was in 2012 where he presented with metastatic disease to the rib. His PSA was 187.   Prior Therapy:  He was found to have a mass in the right axilla near the fourth rib encasing the right with some sclerosis. He underwent a right VATS procedure and resection of the third, fourth and fifth rib and reconstruction with titanium plates under the care of Dr. Arlyce Dice on November 13 2010.  He went on to receive androgen deprivation therapy under the care of Dr. Gaynelle Arabian. His PSA nadir was to 0.1 back in May of 2013 and subsequently started to rise.  He was treated with Provenge in the fall of 2014.  After his PSA went up to 178.6 and Xtandi was started in February of 2015 and discontinued in 09/2013 due to progression of disease and poor tolerance. Taxotere chemotherapy started on 10/05/2013. He is status post 16 cycles of therapy last one given in April 2016 with a PSA reduction down to 0.44.   Current therapy: Treatment holiday with observation and surveillance.  Interim History:  Mr. Arts presents today for a followup visit accompanied by his wife. Since his last visit, he continues to do very well.Marland Kitchen He does not report increased pain. He reports taking Tylenol or ibuprofen once to twice per week. His energy slightly improved and his appetite is stable. He does not report any worsening neuropathy or any deterioration in his health. He continues to enjoy a reasonable performance status at this time. His hair is growing back and is enjoying in good quality of life.    He did not report any nausea or vomiting. His mobility and performance status remains the same at this time. He does not report any headaches or blurry vision or double vision. Has not  reported any syncope or change in his mental status. He is not reporting any chest pain or shortness of breath.  Does not report any palpitation or leg edema. Does not report any frequency urgency or hesitancy. Does not report any skeletal complications. Does not report any lymphadenopathy or petechiae. Remainder of his review of systems unremarkable.  Medications: I have reviewed the patient's current medications.  Current Outpatient Prescriptions  Medication Sig Dispense Refill  . acetaminophen (TYLENOL) 325 MG tablet Take 650 mg by mouth every 6 (six) hours as needed for moderate pain.     Marland Kitchen aspirin EC 81 MG tablet Take 81 mg by mouth at bedtime.    . Calcium Citrate-Vitamin D (CITRACAL + D PO) Take 3 tablets by mouth every morning.    . ferrous sulfate 325 (65 FE) MG tablet Take 325 mg by mouth daily with breakfast.    . ibuprofen (ADVIL,MOTRIN) 200 MG tablet Take 200 mg by mouth every 4 (four) hours as needed for moderate pain.    Marland Kitchen lidocaine-prilocaine (EMLA) cream Apply 1 application topically as needed. 30 g 2  . mirtazapine (REMERON) 15 MG tablet Take 1 tablet (15 mg total) by mouth at bedtime. 30 tablet 1  . pantoprazole (PROTONIX) 40 MG tablet Take 1 tablet (40 mg total) by mouth 2 (two) times daily before a meal. 60 tablet 5  . prochlorperazine (COMPAZINE) 10 MG tablet Take 1 tablet (10 mg total) by mouth every 6 (six) hours as needed for nausea or vomiting.  30 tablet 0  . simvastatin (ZOCOR) 40 MG tablet Take 40 mg by mouth daily.     No current facility-administered medications for this visit.   Facility-Administered Medications Ordered in Other Visits  Medication Dose Route Frequency Provider Last Rate Last Dose  . sodium chloride 0.9 % injection 10 mL  10 mL Intracatheter PRN Wyatt Portela, MD   10 mL at 06/13/14 1341     Allergies:  Allergies  Allergen Reactions  . Oxycodone Nausea And Vomiting  . Penicillins Itching and Rash    Past Medical History, Surgical history,  Social history, and Family History were reviewed and updated.   Physical Exam: Blood pressure 102/73, pulse 86, temperature 98 F (36.7 C), temperature source Oral, resp. rate 18, height 5\' 4"  (1.626 m), weight 135 lb (61.236 kg), SpO2 98 %. ECOG:  1 General appearance: alert and cooperative chronically ill-appearing but appeared well today. Head: Normocephalic, without obvious abnormality Neck: no adenopathy Lymph nodes: Cervical, supraclavicular, and axillary nodes normal. Heart:regular rate and rhythm, S1, S2 normal, no murmur, click, rub or gallop Lung:chest clear, no wheezing, rales, normal symmetric air entry Abdomin: soft, non-tender, without masses or organomegaly EXT: trace edema bilaterally at the level of the ankle. Neurological: No deficits noted. Right anterior chest Port a Cath in place. No erythema or induration.  Lab Results: Lab Results  Component Value Date   WBC 7.6 11/13/2014   HGB 12.2* 11/13/2014   HCT 36.7* 11/13/2014   MCV 91.6 11/13/2014   PLT 247 11/13/2014     Chemistry      Component Value Date/Time   NA 136 09/27/2014 1321   NA 140 03/15/2014 0553   K 4.4 09/27/2014 1321   K 3.5* 03/15/2014 0553   CL 107 03/15/2014 0553   CO2 28 09/27/2014 1321   CO2 23 03/15/2014 0553   BUN 17.3 09/27/2014 1321   BUN 30* 03/15/2014 0553   CREATININE 0.8 09/27/2014 1321   CREATININE 0.69 03/15/2014 0553      Component Value Date/Time   CALCIUM 8.6 09/27/2014 1321   CALCIUM 8.1* 03/15/2014 0553   ALKPHOS 111 09/27/2014 1321   ALKPHOS 82 03/15/2014 0553   AST 30 09/27/2014 1321   AST 16 03/15/2014 0553   ALT 16 09/27/2014 1321   ALT 8 03/15/2014 0553   BILITOT 0.59 09/27/2014 1321   BILITOT 0.6 03/15/2014 0553        Results for EHTAN, DELFAVERO (MRN [id number]) as of 11/13/2014 11:22  Ref. Range 07/04/2014 10:45 07/25/2014 13:02 08/15/2014 12:22 09/27/2014 13:21  PSA Latest Ref Range: <=4.00 ng/mL 0.48 0.47 0.44 0.46           Impression and  Plan:  74 year old gentleman with the following issues:  1. 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 After progressing on Xtandi, he received systemic chemotherapy in the form of Taxotere between June 2015 till April 2016 for a total of 16 cycles. His PSA dropped down to 0.44 from 176.  He is currently on treatment holiday due to the accumulation of Taxotere side effects. He is recovering well from side effects associated with chemotherapy and his PSA continues to be very low. The plan is to continue on treatment holiday and reinstitute treatment upon relapse or rapid rise in his PSA.  Treatment options were discussed today including systemic chemotherapy or possibly Zytiga which I do not think he has had before.  2. Bone pain: Improved with systemic chemotherapy. No relapse of  his pain at this time. He takes very little pain medication at this time.  3. Nausea prophylaxis: He has antiemetics prescribed and has not needed it at this time.  4. IV access: Status post right anterior chest Port-A-Cath placement. This will be flushed every 6 weeks of chemotherapy.  5. Poor appetite/Depression: This has improved off chemotherapy.  6. Followup: Will be in 6 weeks for a follow-up.   [facility], MD  7/26/201611:31 AM

## 2014-11-14 ENCOUNTER — Telehealth: Payer: Self-pay | Admitting: *Deleted

## 2014-11-14 LAB — PSA: PSA: 0.51 ng/mL

## 2014-11-14 NOTE — Telephone Encounter (Signed)
-----   Message from Wyatt Portela, MD sent at 11/14/2014  9:02 AM EDT ----- Please call his PSA

## 2014-11-14 NOTE — Telephone Encounter (Signed)
Per Dr. Alen Blew, I informed wife that his PSA level was 0.5. Wife verbalized understanding.

## 2014-12-31 ENCOUNTER — Ambulatory Visit (INDEPENDENT_AMBULATORY_CARE_PROVIDER_SITE_OTHER): Payer: Medicare Other | Admitting: Internal Medicine

## 2015-01-01 ENCOUNTER — Ambulatory Visit (HOSPITAL_BASED_OUTPATIENT_CLINIC_OR_DEPARTMENT_OTHER): Payer: Medicare Other | Admitting: Oncology

## 2015-01-01 ENCOUNTER — Ambulatory Visit (INDEPENDENT_AMBULATORY_CARE_PROVIDER_SITE_OTHER): Payer: Medicare Other | Admitting: Internal Medicine

## 2015-01-01 ENCOUNTER — Telehealth: Payer: Self-pay | Admitting: Oncology

## 2015-01-01 ENCOUNTER — Other Ambulatory Visit (HOSPITAL_BASED_OUTPATIENT_CLINIC_OR_DEPARTMENT_OTHER): Payer: Medicare Other

## 2015-01-01 ENCOUNTER — Ambulatory Visit (HOSPITAL_BASED_OUTPATIENT_CLINIC_OR_DEPARTMENT_OTHER): Payer: Medicare Other

## 2015-01-01 VITALS — BP 135/74 | HR 88 | Temp 97.5°F | Resp 17 | Ht 64.0 in | Wt 139.6 lb

## 2015-01-01 DIAGNOSIS — C7951 Secondary malignant neoplasm of bone: Secondary | ICD-10-CM

## 2015-01-01 DIAGNOSIS — C61 Malignant neoplasm of prostate: Secondary | ICD-10-CM

## 2015-01-01 DIAGNOSIS — F329 Major depressive disorder, single episode, unspecified: Secondary | ICD-10-CM | POA: Diagnosis not present

## 2015-01-01 DIAGNOSIS — R630 Anorexia: Secondary | ICD-10-CM

## 2015-01-01 DIAGNOSIS — Z95828 Presence of other vascular implants and grafts: Secondary | ICD-10-CM

## 2015-01-01 LAB — CBC WITH DIFFERENTIAL/PLATELET
BASO%: 0.9 % (ref 0.0–2.0)
BASOS ABS: 0.1 10*3/uL (ref 0.0–0.1)
EOS ABS: 0.4 10*3/uL (ref 0.0–0.5)
EOS%: 6.6 % (ref 0.0–7.0)
HCT: 38.6 % (ref 38.4–49.9)
HGB: 12.5 g/dL — ABNORMAL LOW (ref 13.0–17.1)
LYMPH%: 41.7 % (ref 14.0–49.0)
MCH: 29.8 pg (ref 27.2–33.4)
MCHC: 32.4 g/dL (ref 32.0–36.0)
MCV: 91.9 fL (ref 79.3–98.0)
MONO#: 0.5 10*3/uL (ref 0.1–0.9)
MONO%: 7.3 % (ref 0.0–14.0)
NEUT#: 2.7 10*3/uL (ref 1.5–6.5)
NEUT%: 43.5 % (ref 39.0–75.0)
Platelets: 225 10*3/uL (ref 140–400)
RBC: 4.2 10*6/uL (ref 4.20–5.82)
RDW: 14.4 % (ref 11.0–14.6)
WBC: 6.3 10*3/uL (ref 4.0–10.3)
lymph#: 2.6 10*3/uL (ref 0.9–3.3)

## 2015-01-01 LAB — COMPREHENSIVE METABOLIC PANEL (CC13)
ALBUMIN: 3.4 g/dL — AB (ref 3.5–5.0)
ALK PHOS: 117 U/L (ref 40–150)
ALT: 22 U/L (ref 0–55)
AST: 35 U/L — AB (ref 5–34)
Anion Gap: 4 mEq/L (ref 3–11)
BUN: 14.7 mg/dL (ref 7.0–26.0)
CHLORIDE: 107 meq/L (ref 98–109)
CO2: 29 meq/L (ref 22–29)
Calcium: 9.4 mg/dL (ref 8.4–10.4)
Creatinine: 0.8 mg/dL (ref 0.7–1.3)
EGFR: 88 mL/min/{1.73_m2} — AB (ref >90)
GLUCOSE: 92 mg/dL (ref 70–140)
POTASSIUM: 4 meq/L (ref 3.5–5.1)
SODIUM: 140 meq/L (ref 136–145)
Total Bilirubin: 0.57 mg/dL (ref 0.20–1.20)
Total Protein: 6.7 g/dL (ref 6.4–8.3)

## 2015-01-01 MED ORDER — HEPARIN SOD (PORK) LOCK FLUSH 100 UNIT/ML IV SOLN
500.0000 [IU] | Freq: Once | INTRAVENOUS | Status: AC
  Administered 2015-01-01: 500 [IU] via INTRAVENOUS
  Filled 2015-01-01: qty 5

## 2015-01-01 MED ORDER — SODIUM CHLORIDE 0.9 % IJ SOLN
10.0000 mL | INTRAMUSCULAR | Status: DC | PRN
  Administered 2015-01-01: 10 mL via INTRAVENOUS
  Filled 2015-01-01: qty 10

## 2015-01-01 NOTE — Patient Instructions (Signed)

## 2015-01-01 NOTE — Telephone Encounter (Signed)
Gave and printed appt sched and avs for pt for Sept and OCT °

## 2015-01-01 NOTE — Progress Notes (Signed)
Hematology and Oncology Follow Up Visit  [name] [name] [id number] June 04, 1940 74 y.o. 01/01/2015 9:49 AM   Principle Diagnosis: 74 year old gentleman with castration resistant metastatic prostate cancer with disease to the bone. His initial diagnosis was in 2012 where he presented with metastatic disease to the rib. His PSA was 187.   Prior Therapy:  He was found to have a mass in the right axilla near the fourth rib encasing the right with some sclerosis. He underwent a right VATS procedure and resection of the third, fourth and fifth rib and reconstruction with titanium plates under the care of Dr. Arlyce Dice on November 13 2010.  He went on to receive androgen deprivation therapy under the care of Dr. Gaynelle Arabian. His PSA nadir was to 0.1 back in May of 2013 and subsequently started to rise.  He was treated with Provenge in the fall of 2014.  After his PSA went up to 178.6 and Xtandi was started in February of 2015 and discontinued in 09/2013 due to progression of disease and poor tolerance. Taxotere chemotherapy started on 10/05/2013. He is status post 16 cycles of therapy last one given in April 2016 with a PSA reduction down to 0.44.   Current therapy: Treatment holiday with observation and surveillance.  Interim History:  [name] [name] presents today for a followup visit accompanied by his wife. Since his last visit, he reports no new complaints. He continues to recover well from chemotherapy and have gained more weight. His appetite is reasonably better and his pain continues to be under excellent control. He does not report increased pain. He reports taking Tylenol or ibuprofen once to twice per week. His energy slightly improved. He does not report any worsening neuropathy or any deterioration in his health. He continues to enjoy a reasonable performance status at this time. His quality of life is improving.    He did not report any nausea or vomiting. His mobility and performance status remains  the same at this time. He does not report any headaches or blurry vision or double vision. Has not reported any syncope or change in his mental status. He is not reporting any chest pain or shortness of breath.  Does not report any palpitation or leg edema. Does not report any frequency urgency or hesitancy. Does not report any skeletal complications. Does not report any lymphadenopathy or petechiae. Remainder of his review of systems unremarkable.  Medications: I have reviewed the patient's current medications.  Current Outpatient Prescriptions  Medication Sig Dispense Refill  . acetaminophen (TYLENOL) 325 MG tablet Take 650 mg by mouth every 6 (six) hours as needed for moderate pain.     Marland Kitchen aspirin EC 81 MG tablet Take 81 mg by mouth at bedtime.    . Calcium Citrate-Vitamin D (CITRACAL + D PO) Take 3 tablets by mouth every morning.    . ferrous sulfate 325 (65 FE) MG tablet Take 325 mg by mouth daily with breakfast.    . ibuprofen (ADVIL,MOTRIN) 200 MG tablet Take 200 mg by mouth every 4 (four) hours as needed for moderate pain.    Marland Kitchen lidocaine-prilocaine (EMLA) cream Apply 1 application topically as needed. 30 g 2  . mirtazapine (REMERON) 15 MG tablet Take 1 tablet (15 mg total) by mouth at bedtime. 30 tablet 1  . pantoprazole (PROTONIX) 40 MG tablet Take 1 tablet (40 mg total) by mouth 2 (two) times daily before a meal. 60 tablet 5  . prochlorperazine (COMPAZINE) 10 MG tablet Take 1 tablet (10 mg  total) by mouth every 6 (six) hours as needed for nausea or vomiting. 30 tablet 0  . simvastatin (ZOCOR) 40 MG tablet Take 40 mg by mouth daily.     No current facility-administered medications for this visit.   Facility-Administered Medications Ordered in Other Visits  Medication Dose Route Frequency Provider Last Rate Last Dose  . sodium chloride 0.9 % injection 10 mL  10 mL Intracatheter PRN Wyatt Portela, MD   10 mL at 06/13/14 1341     Allergies:  Allergies  Allergen Reactions  . Oxycodone  Nausea And Vomiting  . Penicillins Itching and Rash    Past Medical History, Surgical history, Social history, and Family History were reviewed and updated.   Physical Exam: Blood pressure 135/74, pulse 88, temperature 97.5 F (36.4 C), temperature source Oral, resp. rate 17, height 5\' 4"  (1.626 m), weight 139 lb 9.6 oz (63.322 kg), SpO2 97 %. ECOG:  1 General appearance: alert and cooperative chronically ill-appearing without any distress today. Head: Normocephalic, without obvious abnormality Neck: no adenopathy Lymph nodes: Cervical, supraclavicular, and axillary nodes normal. Heart:regular rate and rhythm, S1, S2 normal, no murmur, click, rub or gallop Lung:chest clear, no wheezing, rales, normal symmetric air entry no dullness to percussion. Abdomin: soft, non-tender, without masses or organomegaly EXT: trace edema bilaterally at the level of the ankle. Neurological: No deficits noted. Right anterior chest Port a Cath in place. No erythema or induration.  Lab Results: Lab Results  Component Value Date   WBC 6.3 01/01/2015   HGB 12.5* 01/01/2015   HCT 38.6 01/01/2015   MCV 91.9 01/01/2015   PLT 225 01/01/2015     Chemistry      Component Value Date/Time   NA 136 11/13/2014 1058   NA 140 03/15/2014 0553   K 4.6 11/13/2014 1058   K 3.5* 03/15/2014 0553   CL 107 03/15/2014 0553   CO2 28 11/13/2014 1058   CO2 23 03/15/2014 0553   BUN 18.8 11/13/2014 1058   BUN 30* 03/15/2014 0553   CREATININE 0.9 11/13/2014 1058   CREATININE 0.69 03/15/2014 0553      Component Value Date/Time   CALCIUM 9.4 11/13/2014 1058   CALCIUM 8.1* 03/15/2014 0553   ALKPHOS 120 11/13/2014 1058   ALKPHOS 82 03/15/2014 0553   AST 31 11/13/2014 1058   AST 16 03/15/2014 0553   ALT 17 11/13/2014 1058   ALT 8 03/15/2014 0553   BILITOT 0.48 11/13/2014 1058   BILITOT 0.6 03/15/2014 0553          Results for TINSLEY, LOMAS (MRN [id number]) as of 01/01/2015 09:41  Ref. Range 08/15/2014  12:22 09/27/2014 13:21 11/13/2014 10:58  PSA Latest Ref Range: <=4.00 ng/mL 0.44 0.46 0.51         Impression and Plan:  73 year old gentleman with the following issues:  1. 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 After progressing on Xtandi, he received systemic chemotherapy in the form of Taxotere between June 2015 till April 2016 for a total of 16 cycles. His PSA dropped down to 0.44 from 176.  He is currently on treatment holiday due to the accumulation of Taxotere side effects. He is recovering well from side effects associated with chemotherapy and his PSA continues to be very low at 0.51.   The plan is to continue on treatment holiday and reinstitute treatment upon relapse or rapid rise in his PSA.  Treatment options were discussed today including systemic chemotherapy or possibly second  line hormonal therapy.  2. Bone pain: Improved with systemic chemotherapy. No relapse of his pain at this time. He takes very little pain medication at this time.  3. Nausea prophylaxis: He has antiemetics prescribed and has not needed it at this time.  4. IV access: Status post right anterior chest Port-A-Cath placement. This will be flushed every 6 weeks of chemotherapy.  5. Poor appetite/Depression: This has improved off chemotherapy. He has gained 4 pounds since the last visit.  6. Followup: Will be in 6 weeks for a follow-up.   [facility], MD  9/13/20169:49 AM

## 2015-01-02 ENCOUNTER — Telehealth: Payer: Self-pay | Admitting: *Deleted

## 2015-01-02 LAB — PSA: PSA: 0.64 ng/mL (ref <=4.00)

## 2015-01-02 NOTE — Telephone Encounter (Signed)
-----   Message from Wyatt Portela, MD sent at 01/02/2015  9:19 AM EDT ----- Please call his PSA. Not changed much.

## 2015-01-02 NOTE — Telephone Encounter (Signed)
As noted below by Dr. Alen Blew, I informed patient's wife PSA result. She verbalized understanding.

## 2015-01-07 ENCOUNTER — Other Ambulatory Visit: Payer: Self-pay | Admitting: Oncology

## 2015-01-14 ENCOUNTER — Ambulatory Visit (INDEPENDENT_AMBULATORY_CARE_PROVIDER_SITE_OTHER): Payer: Medicare Other | Admitting: Internal Medicine

## 2015-02-05 ENCOUNTER — Ambulatory Visit (INDEPENDENT_AMBULATORY_CARE_PROVIDER_SITE_OTHER): Payer: Medicare Other | Admitting: Internal Medicine

## 2015-02-05 ENCOUNTER — Encounter (INDEPENDENT_AMBULATORY_CARE_PROVIDER_SITE_OTHER): Payer: Self-pay | Admitting: Internal Medicine

## 2015-02-05 VITALS — BP 104/62 | HR 76 | Temp 98.6°F | Ht 66.0 in | Wt 144.7 lb

## 2015-02-05 DIAGNOSIS — K922 Gastrointestinal hemorrhage, unspecified: Secondary | ICD-10-CM

## 2015-02-05 LAB — CBC WITH DIFFERENTIAL/PLATELET
BASOS PCT: 1 % (ref 0–1)
Basophils Absolute: 0.1 10*3/uL (ref 0.0–0.1)
EOS ABS: 0.4 10*3/uL (ref 0.0–0.7)
Eosinophils Relative: 5 % (ref 0–5)
HCT: 36.3 % — ABNORMAL LOW (ref 39.0–52.0)
Hemoglobin: 12 g/dL — ABNORMAL LOW (ref 13.0–17.0)
LYMPHS ABS: 3.4 10*3/uL (ref 0.7–4.0)
Lymphocytes Relative: 39 % (ref 12–46)
MCH: 30.7 pg (ref 26.0–34.0)
MCHC: 33.1 g/dL (ref 30.0–36.0)
MCV: 92.8 fL (ref 78.0–100.0)
MONO ABS: 0.7 10*3/uL (ref 0.1–1.0)
MONOS PCT: 8 % (ref 3–12)
MPV: 9.3 fL (ref 8.6–12.4)
Neutro Abs: 4.1 10*3/uL (ref 1.7–7.7)
Neutrophils Relative %: 47 % (ref 43–77)
PLATELETS: 257 10*3/uL (ref 150–400)
RBC: 3.91 MIL/uL — ABNORMAL LOW (ref 4.22–5.81)
RDW: 14.3 % (ref 11.5–15.5)
WBC: 8.7 10*3/uL (ref 4.0–10.5)

## 2015-02-05 NOTE — Progress Notes (Signed)
Subjective:    Patient ID: [name] [name], male    DOB: December 14, 1940, 74 y.o.   MRN: [id number]  HPI Here today for f/u. Last seen in March of this year. Hx of GI bleed in November of 2015. Admission hemoglobin was 7.6. He received two units of PRBCs while in the hospital. He underwent an EGD which revealed  Impression: 5 cm long tubular Barrett's segment with multiple benign appearing ulcers felt to be source of patient's upper GI bleed. No active bleeding noted. Moderate-sized sliding hiatal hernia. Hx of prostate cancer with metastasis. He is on a break from chemo x 21 weeks. He sees Dr. Alen Blew at [facility]. Last colonoscopy over 23yrs ago in [place] Last hemoglobin in September was 12.5.  Last colonoscopy over 20 yrs ago in [place].  Appetite is good. He has lost 3 pounds since his visit. No GERD. There is no abdominal pain. He has a BM daily. No melena or BRRB.  CBC    Component Value Date/Time   WBC 6.3 01/01/2015 0915   WBC 7.9 06/08/2014 0930   RBC 4.20 01/01/2015 0915   RBC 3.87* 06/08/2014 0930   HGB 12.5* 01/01/2015 0915   HGB 11.7* 06/08/2014 0930   HCT 38.6 01/01/2015 0915   HCT 36.5* 06/08/2014 0930   PLT 225 01/01/2015 0915   PLT 296 06/08/2014 0930   MCV 91.9 01/01/2015 0915   MCV 94.3 06/08/2014 0930   MCH 29.8 01/01/2015 0915   MCH 30.2 06/08/2014 0930   MCHC 32.4 01/01/2015 0915   MCHC 32.1 06/08/2014 0930   RDW 14.4 01/01/2015 0915   RDW 17.4* 06/08/2014 0930   LYMPHSABS 2.6 01/01/2015 0915   LYMPHSABS 1.2 06/08/2014 0930   MONOABS 0.5 01/01/2015 0915   MONOABS 0.6 06/08/2014 0930   EOSABS 0.4 01/01/2015 0915   EOSABS 0.1 06/08/2014 0930   BASOSABS 0.1 01/01/2015 0915   BASOSABS 0.1 06/08/2014 0930      Review of Systems Past Medical History  Diagnosis Date  . Hyperlipidemia   . Prostate carcinoma ([place])   . Metastatic bone tumor ([place]) 11/13/2010    RT VAT , RESECTION OF 3rd,4th ,5th ribs with reconstruction with allograft and titanium  platesDR. BURNEY  . Anxiety   . GERD (gastroesophageal reflux disease)   . Tachycardia     history of  . Fatigue   . Hypertension     denies, has had hypotension    Past Surgical History  Procedure Laterality Date  . Resection bone tumor rib  [id number]    RT VAT , RESECTION OF 3rd,4th ,5th ribs with reconstruction with allograft and titanium platesDR. BURNEY  . Dental surgery    . Esophagogastroduodenoscopy N/A 03/14/2014    Procedure: ESOPHAGOGASTRODUODENOSCOPY (EGD);  Surgeon: Rogene Houston, MD;  Location: [facility];  Service: Endoscopy;  Laterality: N/A;    Allergies  Allergen Reactions  . Oxycodone Nausea And Vomiting  . Penicillins Itching and Rash    Current Outpatient Prescriptions on File Prior to Visit  Medication Sig Dispense Refill  . aspirin EC 81 MG tablet Take 81 mg by mouth at bedtime.    . Calcium Citrate-Vitamin D (CITRACAL + D PO) Take 3 tablets by mouth every morning.    . ferrous sulfate 325 (65 FE) MG tablet Take 325 mg by mouth daily with breakfast.    . ibuprofen (ADVIL,MOTRIN) 200 MG tablet Take 200 mg by mouth every 4 (four) hours as needed for moderate pain.    Marland Kitchen  lidocaine-prilocaine (EMLA) cream Apply 1 application topically as needed. 30 g 2  . mirtazapine (REMERON) 15 MG tablet TAKE 1 TABLET BY MOUTH AT BEDTIME 30 tablet 1  . pantoprazole (PROTONIX) 40 MG tablet Take 1 tablet (40 mg total) by mouth 2 (two) times daily before a meal. 60 tablet 5  . simvastatin (ZOCOR) 40 MG tablet Take 40 mg by mouth daily.    Marland Kitchen acetaminophen (TYLENOL) 325 MG tablet Take 650 mg by mouth every 6 (six) hours as needed for moderate pain.     Marland Kitchen prochlorperazine (COMPAZINE) 10 MG tablet Take 1 tablet (10 mg total) by mouth every 6 (six) hours as needed for nausea or vomiting. (Patient not taking: Reported on 02/05/2015) 30 tablet 0   Current Facility-Administered Medications on File Prior to Visit  Medication Dose Route Frequency Provider Last Rate Last Dose  .  sodium chloride 0.9 % injection 10 mL  10 mL Intracatheter PRN Wyatt Portela, MD   10 mL at 06/13/14 1341        Objective:   Physical Exam Blood pressure 104/62, pulse 76, temperature 98.6 F (37 C), height 5\' 6"  (1.676 m), weight 144 lb 11.2 oz (65.635 kg). Alert and oriented. Skin warm and dry. Oral mucosa is moist.   . Sclera anicteric, conjunctivae is pink. Thyroid not enlarged. No cervical lymphadenopathy. Lungs clear. Heart regular rate and rhythm.  Abdomen is soft. Bowel sounds are positive. No hepatomegaly. No abdominal masses felt. No tenderness.  No edema to lower extremities.          Assessment & Plan:  Upper GI bleed. He is doing well. His hemoglobin looks good. He feels better. No NSAIDS. OV in 6 month with a CBC. Patient will let me know when he is ready for a colonoscopy.

## 2015-02-05 NOTE — Patient Instructions (Signed)
OV in 6 months with a CBC 

## 2015-02-12 ENCOUNTER — Ambulatory Visit (HOSPITAL_BASED_OUTPATIENT_CLINIC_OR_DEPARTMENT_OTHER): Payer: Medicare Other | Admitting: Oncology

## 2015-02-12 ENCOUNTER — Ambulatory Visit (HOSPITAL_BASED_OUTPATIENT_CLINIC_OR_DEPARTMENT_OTHER): Payer: Medicare Other

## 2015-02-12 ENCOUNTER — Other Ambulatory Visit (HOSPITAL_BASED_OUTPATIENT_CLINIC_OR_DEPARTMENT_OTHER): Payer: Medicare Other

## 2015-02-12 ENCOUNTER — Telehealth: Payer: Self-pay | Admitting: Oncology

## 2015-02-12 VITALS — BP 127/73 | HR 81 | Temp 98.1°F | Resp 20 | Ht 66.0 in | Wt 144.3 lb

## 2015-02-12 DIAGNOSIS — C61 Malignant neoplasm of prostate: Secondary | ICD-10-CM

## 2015-02-12 DIAGNOSIS — Z95828 Presence of other vascular implants and grafts: Secondary | ICD-10-CM

## 2015-02-12 DIAGNOSIS — C7951 Secondary malignant neoplasm of bone: Secondary | ICD-10-CM

## 2015-02-12 LAB — CBC WITH DIFFERENTIAL/PLATELET
BASO%: 0.8 % (ref 0.0–2.0)
Basophils Absolute: 0.1 10*3/uL (ref 0.0–0.1)
EOS ABS: 0.4 10*3/uL (ref 0.0–0.5)
EOS%: 5.4 % (ref 0.0–7.0)
HCT: 39.3 % (ref 38.4–49.9)
HGB: 13 g/dL (ref 13.0–17.1)
LYMPH%: 38.9 % (ref 14.0–49.0)
MCH: 30.6 pg (ref 27.2–33.4)
MCHC: 33 g/dL (ref 32.0–36.0)
MCV: 92.5 fL (ref 79.3–98.0)
MONO#: 0.5 10*3/uL (ref 0.1–0.9)
MONO%: 6.9 % (ref 0.0–14.0)
NEUT%: 48 % (ref 39.0–75.0)
NEUTROS ABS: 3.6 10*3/uL (ref 1.5–6.5)
PLATELETS: 236 10*3/uL (ref 140–400)
RBC: 4.25 10*6/uL (ref 4.20–5.82)
RDW: 14.3 % (ref 11.0–14.6)
WBC: 7.6 10*3/uL (ref 4.0–10.3)
lymph#: 3 10*3/uL (ref 0.9–3.3)

## 2015-02-12 LAB — COMPREHENSIVE METABOLIC PANEL (CC13)
ALBUMIN: 3.6 g/dL (ref 3.5–5.0)
ALK PHOS: 120 U/L (ref 40–150)
ALT: 20 U/L (ref 0–55)
AST: 29 U/L (ref 5–34)
Anion Gap: 6 mEq/L (ref 3–11)
BILIRUBIN TOTAL: 0.68 mg/dL (ref 0.20–1.20)
BUN: 16.3 mg/dL (ref 7.0–26.0)
CO2: 28 mEq/L (ref 22–29)
CREATININE: 0.9 mg/dL (ref 0.7–1.3)
Calcium: 9.8 mg/dL (ref 8.4–10.4)
Chloride: 104 mEq/L (ref 98–109)
EGFR: 86 mL/min/{1.73_m2} — AB (ref >90)
GLUCOSE: 114 mg/dL (ref 70–140)
Potassium: 4.4 mEq/L (ref 3.5–5.1)
SODIUM: 138 meq/L (ref 136–145)
TOTAL PROTEIN: 6.9 g/dL (ref 6.4–8.3)

## 2015-02-12 MED ORDER — HEPARIN SOD (PORK) LOCK FLUSH 100 UNIT/ML IV SOLN
500.0000 [IU] | Freq: Once | INTRAVENOUS | Status: AC
  Administered 2015-02-12: 500 [IU] via INTRAVENOUS
  Filled 2015-02-12: qty 5

## 2015-02-12 MED ORDER — SODIUM CHLORIDE 0.9 % IJ SOLN
10.0000 mL | INTRAMUSCULAR | Status: DC | PRN
  Administered 2015-02-12: 10 mL via INTRAVENOUS
  Filled 2015-02-12: qty 10

## 2015-02-12 NOTE — Progress Notes (Signed)
Hematology and Oncology Follow Up Visit  Greig Altergott [id number] 1940-06-25 74 y.o. 02/12/2015 9:14 AM   Principle Diagnosis: 74 year old gentleman with castration resistant metastatic prostate cancer with disease to the bone. His initial diagnosis was in 2012 where he presented with metastatic disease to the rib. His PSA was 187.   Prior Therapy:  He was found to have a mass in the right axilla near the fourth rib encasing the right with some sclerosis. He underwent a right VATS procedure and resection of the third, fourth and fifth rib and reconstruction with titanium plates under the care of Dr. Arlyce Dice on November 13 2010.  He went on to receive androgen deprivation therapy under the care of Dr. Gaynelle Arabian. His PSA nadir was to 0.1 back in May of 2013 and subsequently started to rise.  He was treated with Provenge in the fall of 2014.  After his PSA went up to 178.6 and Xtandi was started in February of 2015 and discontinued in 09/2013 due to progression of disease and poor tolerance. Taxotere chemotherapy started on 10/05/2013. He is status post 16 cycles of therapy last one given in April 2016 with a PSA reduction down to 0.44.   Current therapy: Treatment holiday with observation and surveillance.  Interim History:  Mr. Lavell presents today for a followup visit accompanied by his wife. Since his last visit, he continues to do very well without any issues. He has not reported worsening back pain or arthralgias. His energy continues to improve off systemic chemotherapy at this time. His appetite is reasonably better and his pain continues to be under excellent control. He does not report any worsening neuropathy or any deterioration in his health. He continues to enjoy a reasonable performance status at this time.    He did not report any nausea or vomiting. His mobility and performance status remains the same at this time. He does not report any headaches or blurry vision or double vision.  Has not reported any syncope or change in his mental status. He is not reporting any chest pain or shortness of breath.  Does not report any palpitation or leg edema. Does not report any frequency urgency or hesitancy. Does not report any skeletal complications. Does not report any lymphadenopathy or petechiae. Remainder of his review of systems unremarkable.  Medications: I have reviewed the patient's current medications.  Current Outpatient Prescriptions  Medication Sig Dispense Refill  . acetaminophen (TYLENOL) 325 MG tablet Take 650 mg by mouth every 6 (six) hours as needed for moderate pain.     Marland Kitchen aspirin EC 81 MG tablet Take 81 mg by mouth at bedtime.    . Calcium Citrate-Vitamin D (CITRACAL + D PO) Take 3 tablets by mouth every morning.    . cholecalciferol (VITAMIN D) 400 UNITS TABS tablet Take 1,500 Units by mouth.    . ferrous sulfate 325 (65 FE) MG tablet Take 325 mg by mouth daily with breakfast.    . ibuprofen (ADVIL,MOTRIN) 200 MG tablet Take 200 mg by mouth every 4 (four) hours as needed for moderate pain.    Marland Kitchen lidocaine-prilocaine (EMLA) cream Apply 1 application topically as needed. 30 g 2  . mirtazapine (REMERON) 15 MG tablet TAKE 1 TABLET BY MOUTH AT BEDTIME 30 tablet 1  . pantoprazole (PROTONIX) 40 MG tablet Take 1 tablet (40 mg total) by mouth 2 (two) times daily before a meal. 60 tablet 5  . prochlorperazine (COMPAZINE) 10 MG tablet Take 1 tablet (10 mg total) by  mouth every 6 (six) hours as needed for nausea or vomiting. 30 tablet 0  . simvastatin (ZOCOR) 40 MG tablet Take 40 mg by mouth daily.     No current facility-administered medications for this visit.   Facility-Administered Medications Ordered in Other Visits  Medication Dose Route Frequency Provider Last Rate Last Dose  . sodium chloride 0.9 % injection 10 mL  10 mL Intracatheter PRN Wyatt Portela, MD   10 mL at 06/13/14 1341  . sodium chloride 0.9 % injection 10 mL  10 mL Intravenous PRN Wyatt Portela, MD   10  mL at 02/12/15 0820     Allergies:  Allergies  Allergen Reactions  . Oxycodone Nausea And Vomiting  . Penicillins Itching and Rash    Past Medical History, Surgical history, Social history, and Family History were reviewed and updated.   Physical Exam: Blood pressure 127/73, pulse 81, temperature 98.1 F (36.7 C), temperature source Oral, resp. rate 20, height 5\' 6"  (1.676 m), weight 144 lb 4.8 oz (65.454 kg), SpO2 99 %. ECOG:  1 General appearance: alert and cooperative well-appearing gentleman without distress. Head: Normocephalic, without obvious abnormality Neck: no adenopathy Lymph nodes: Cervical, supraclavicular, and axillary nodes normal. Heart:regular rate and rhythm, S1, S2 normal, no murmur, click, rub or gallop  Chest wall examination revealed Port-A-Cath site to be intact without erythema or induration. Lung:chest clear, no wheezing, rales, normal symmetric air entry no dullness to percussion. Abdomin: soft, non-tender, without masses or organomegaly EXT: trace edema bilaterally at the level of the ankle. Neurological: No deficits noted.   Lab Results: Lab Results  Component Value Date   WBC 8.7 02/05/2015   HGB 12.0* 02/05/2015   HCT 36.3* 02/05/2015   MCV 92.8 02/05/2015   PLT 257 02/05/2015     Chemistry      Component Value Date/Time   NA 140 01/01/2015 0915   NA 140 03/15/2014 0553   K 4.0 01/01/2015 0915   K 3.5* 03/15/2014 0553   CL 107 03/15/2014 0553   CO2 29 01/01/2015 0915   CO2 23 03/15/2014 0553   BUN 14.7 01/01/2015 0915   BUN 30* 03/15/2014 0553   CREATININE 0.8 01/01/2015 0915   CREATININE 0.69 03/15/2014 0553      Component Value Date/Time   CALCIUM 9.4 01/01/2015 0915   CALCIUM 8.1* 03/15/2014 0553   ALKPHOS 117 01/01/2015 0915   ALKPHOS 82 03/15/2014 0553   AST 35* 01/01/2015 0915   AST 16 03/15/2014 0553   ALT 22 01/01/2015 0915   ALT 8 03/15/2014 0553   BILITOT 0.57 01/01/2015 0915   BILITOT 0.6 03/15/2014 0553         Results for TORRIE, LAFAVOR (MRN [id number]) as of 02/12/2015 08:46  Ref. Range 11/13/2014 10:58 01/01/2015 09:15  PSA Latest Ref Range: <=4.00 ng/mL 0.51 0.64          Impression and Plan:  74 year old gentleman with the following issues:  1. 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 After progressing on Xtandi, he received systemic chemotherapy in the form of Taxotere between June 2015 till April 2016 for a total of 16 cycles. His PSA dropped down to 0.44 from 176.  He is currently on treatment holiday due to the accumulation of Taxotere side effects. His PSA continues to be under excellent control at 0.64. Risks and benefits of starting chemotherapy versus continuing treatment holiday were reviewed today and he opted to continue to hold off on any  treatment. His quality of life is excellent off treatment and we'll continue to monitor him closely and institute salvage therapy upon symptomatic progression.    2. Bone pain: Improved with systemic chemotherapy. No relapse of his pain at this time. He does have scoliosis and arthritic pain which have not changed at this time.  3. Nausea prophylaxis: He has antiemetics prescribed and has not needed it at this time.  4. IV access: Status post right anterior chest Port-A-Cath placement. This will be flushed every 6 weeks.   5. Poor appetite/Depression: Appetite appeared to be improved at this time.  6. Followup: Will be in 6 weeks for a follow-up.   [facility], MD  10/25/20169:14 AM

## 2015-02-12 NOTE — Patient Instructions (Signed)

## 2015-02-12 NOTE — Telephone Encounter (Signed)
Gave adn printed appt sched adn avs for pt for DEC °

## 2015-02-13 ENCOUNTER — Encounter: Payer: Self-pay | Admitting: *Deleted

## 2015-02-13 ENCOUNTER — Telehealth: Payer: Self-pay | Admitting: *Deleted

## 2015-02-13 LAB — PSA: PSA: 0.86 ng/mL (ref <=4.00)

## 2015-02-13 NOTE — Telephone Encounter (Signed)
Spoke with patient's wife ellen, gave results of last PSA

## 2015-02-13 NOTE — Progress Notes (Signed)
On 02/12/15 unable to get enough blood from port, for labs. Lab drawn  peripherally. Port flushed per protocol.

## 2015-02-13 NOTE — Telephone Encounter (Signed)
-----   Message from Wyatt Portela, MD sent at 02/13/2015  8:15 AM EDT ----- Please call his PSA. Very little change.

## 2015-03-11 ENCOUNTER — Other Ambulatory Visit: Payer: Self-pay | Admitting: Oncology

## 2015-03-12 ENCOUNTER — Encounter: Payer: Self-pay | Admitting: *Deleted

## 2015-03-28 ENCOUNTER — Other Ambulatory Visit (HOSPITAL_BASED_OUTPATIENT_CLINIC_OR_DEPARTMENT_OTHER): Payer: Medicare Other

## 2015-03-28 ENCOUNTER — Ambulatory Visit (HOSPITAL_BASED_OUTPATIENT_CLINIC_OR_DEPARTMENT_OTHER): Payer: Medicare Other | Admitting: Oncology

## 2015-03-28 ENCOUNTER — Ambulatory Visit (HOSPITAL_BASED_OUTPATIENT_CLINIC_OR_DEPARTMENT_OTHER): Payer: Medicare Other

## 2015-03-28 ENCOUNTER — Telehealth: Payer: Self-pay | Admitting: Oncology

## 2015-03-28 ENCOUNTER — Other Ambulatory Visit: Payer: Self-pay | Admitting: *Deleted

## 2015-03-28 VITALS — BP 119/78 | HR 76 | Temp 97.7°F | Resp 18 | Ht 66.0 in | Wt 145.5 lb

## 2015-03-28 DIAGNOSIS — E291 Testicular hypofunction: Secondary | ICD-10-CM | POA: Diagnosis not present

## 2015-03-28 DIAGNOSIS — C7951 Secondary malignant neoplasm of bone: Secondary | ICD-10-CM | POA: Diagnosis not present

## 2015-03-28 DIAGNOSIS — C61 Malignant neoplasm of prostate: Secondary | ICD-10-CM

## 2015-03-28 DIAGNOSIS — Z95828 Presence of other vascular implants and grafts: Secondary | ICD-10-CM

## 2015-03-28 LAB — COMPREHENSIVE METABOLIC PANEL
ALT: 16 U/L (ref 0–55)
ANION GAP: 10 meq/L (ref 3–11)
AST: 27 U/L (ref 5–34)
Albumin: 3.6 g/dL (ref 3.5–5.0)
Alkaline Phosphatase: 138 U/L (ref 40–150)
BUN: 17 mg/dL (ref 7.0–26.0)
CALCIUM: 9.7 mg/dL (ref 8.4–10.4)
CHLORIDE: 105 meq/L (ref 98–109)
CO2: 24 meq/L (ref 22–29)
Creatinine: 0.9 mg/dL (ref 0.7–1.3)
EGFR: 86 mL/min/{1.73_m2} — AB (ref >90)
Glucose: 82 mg/dl (ref 70–140)
Potassium: 4.4 mEq/L (ref 3.5–5.1)
Sodium: 139 mEq/L (ref 136–145)
Total Bilirubin: 0.49 mg/dL (ref 0.20–1.20)
Total Protein: 7 g/dL (ref 6.4–8.3)

## 2015-03-28 LAB — CBC WITH DIFFERENTIAL/PLATELET
BASO%: 0.8 % (ref 0.0–2.0)
BASOS ABS: 0.1 10*3/uL (ref 0.0–0.1)
EOS%: 5.2 % (ref 0.0–7.0)
Eosinophils Absolute: 0.5 10*3/uL (ref 0.0–0.5)
HEMATOCRIT: 39.9 % (ref 38.4–49.9)
HEMOGLOBIN: 13.2 g/dL (ref 13.0–17.1)
LYMPH#: 3.1 10*3/uL (ref 0.9–3.3)
LYMPH%: 34.8 % (ref 14.0–49.0)
MCH: 30.8 pg (ref 27.2–33.4)
MCHC: 33 g/dL (ref 32.0–36.0)
MCV: 93.3 fL (ref 79.3–98.0)
MONO#: 0.6 10*3/uL (ref 0.1–0.9)
MONO%: 7.1 % (ref 0.0–14.0)
NEUT#: 4.6 10*3/uL (ref 1.5–6.5)
NEUT%: 52.1 % (ref 39.0–75.0)
PLATELETS: 236 10*3/uL (ref 140–400)
RBC: 4.28 10*6/uL (ref 4.20–5.82)
RDW: 13.4 % (ref 11.0–14.6)
WBC: 8.9 10*3/uL (ref 4.0–10.3)

## 2015-03-28 MED ORDER — LIDOCAINE-PRILOCAINE 2.5-2.5 % EX CREA: 1.0000 "application " | TOPICAL_CREAM | CUTANEOUS | Status: DC | PRN

## 2015-03-28 MED ORDER — HEPARIN SOD (PORK) LOCK FLUSH 100 UNIT/ML IV SOLN
500.0000 [IU] | Freq: Once | INTRAVENOUS | Status: AC
  Administered 2015-03-28: 500 [IU] via INTRAVENOUS
  Filled 2015-03-28: qty 5

## 2015-03-28 MED ORDER — SODIUM CHLORIDE 0.9 % IJ SOLN
10.0000 mL | INTRAMUSCULAR | Status: DC | PRN
  Administered 2015-03-28: 10 mL via INTRAVENOUS
  Filled 2015-03-28: qty 10

## 2015-03-28 NOTE — Telephone Encounter (Signed)
Gave and printed appt sched and avs fo rpt for Jan °

## 2015-03-28 NOTE — Patient Instructions (Signed)

## 2015-03-28 NOTE — Progress Notes (Signed)
Hematology and Oncology Follow Up Visit  Jeremaih Tilley [id number] 1941/03/22 74 y.o. 03/28/2015 11:59 AM   Principle Diagnosis: 74 year old gentleman with castration resistant metastatic prostate cancer with disease to the bone. His initial diagnosis was in 2012 where he presented with metastatic disease to the rib. His PSA was 187.   Prior Therapy:  He was found to have a mass in the right axilla near the fourth rib encasing the right with some sclerosis. He underwent a right VATS procedure and resection of the third, fourth and fifth rib and reconstruction with titanium plates under the care of Dr. Arlyce Dice on November 13 2010.  He went on to receive androgen deprivation therapy under the care of Dr. Gaynelle Arabian. His PSA nadir was to 0.1 back in May of 2013 and subsequently started to rise.  He was treated with Provenge in the fall of 2014.  After his PSA went up to 178.6 and Xtandi was started in February of 2015 and discontinued in 09/2013 due to progression of disease and poor tolerance. Taxotere chemotherapy started on 10/05/2013. He is status post 16 cycles of therapy last one given in April 2016 with a PSA reduction down to 0.44.   Current therapy: Treatment holiday with observation and surveillance.  Interim History:  Mr. Christakos presents today for a followup visit with his wife. Since his last visit, he reports more dental issues. He continues to lose his teeth and was evaluated by dental surgeon. No dental surgery is planned given his history of exposure to [place]. However well have caps placed on his current teeth. Despite all that, he does not report any pain or discomfort. He is able to eat very well and have gained more weight.   He has not reported worsening back pain or arthralgias. His energy continues to improve off systemic chemotherapy at this time. He has not reported any worsening back pain or signs of cancer progression. His quality of life is excellent. His mobility have  returned to baseline.    He does not report any headaches or blurry vision or double vision. Has not reported any syncope or change in his mental status. He is not reporting any chest pain or shortness of breath.  Does not report any palpitation or leg edema. Does not report any cough, hemoptysis or wheezing. Does not report any frequency urgency or hesitancy. Does not report any skeletal complications. Does not report any lymphadenopathy or petechiae. Remainder of his review of systems unremarkable.  Medications: I have reviewed the patient's current medications.  Current Outpatient Prescriptions  Medication Sig Dispense Refill  . acetaminophen (TYLENOL) 325 MG tablet Take 650 mg by mouth every 6 (six) hours as needed for moderate pain.     Marland Kitchen aspirin EC 81 MG tablet Take 81 mg by mouth at bedtime.    . Calcium Citrate-Vitamin D (CITRACAL + D PO) Take 3 tablets by mouth every morning.    . cholecalciferol (VITAMIN D) 400 UNITS TABS tablet Take 1,500 Units by mouth.    . ferrous sulfate 325 (65 FE) MG tablet Take 325 mg by mouth daily with breakfast.    . ibuprofen (ADVIL,MOTRIN) 200 MG tablet Take 200 mg by mouth every 4 (four) hours as needed for moderate pain.    Marland Kitchen lidocaine-prilocaine (EMLA) cream Apply 1 application topically as needed. 30 g 2  . mirtazapine (REMERON) 15 MG tablet TAKE 1 TABLET BY MOUTH AT BEDTIME 30 tablet 1  . pantoprazole (PROTONIX) 40 MG tablet Take 1 tablet (  40 mg total) by mouth 2 (two) times daily before a meal. 60 tablet 5  . prochlorperazine (COMPAZINE) 10 MG tablet Take 1 tablet (10 mg total) by mouth every 6 (six) hours as needed for nausea or vomiting. 30 tablet 0  . simvastatin (ZOCOR) 40 MG tablet Take 40 mg by mouth daily.     No current facility-administered medications for this visit.   Facility-Administered Medications Ordered in Other Visits  Medication Dose Route Frequency Provider Last Rate Last Dose  . heparin lock flush 100 unit/mL  500 Units  Intravenous Once Wyatt Portela, MD      . sodium chloride 0.9 % injection 10 mL  10 mL Intracatheter PRN Wyatt Portela, MD   10 mL at 06/13/14 1341  . sodium chloride 0.9 % injection 10 mL  10 mL Intravenous PRN Wyatt Portela, MD         Allergies:  Allergies  Allergen Reactions  . Oxycodone Nausea And Vomiting  . Penicillins Itching and Rash    Past Medical History, Surgical history, Social history, and Family History were reviewed and updated.   Physical Exam: Blood pressure 119/78, pulse 76, temperature 97.7 F (36.5 C), temperature source Oral, resp. rate 18, height 5\' 6"  (1.676 m), weight 145 lb 8 oz (65.998 kg), SpO2 98 %. ECOG:  1 General appearance: alert and cooperative elderly gentleman without distress. Head: Normocephalic, without obvious abnormality ulcers or lesions. Neck: no adenopathy no thyroid masses. Lymph nodes: Cervical, supraclavicular, and axillary nodes normal. Heart:regular rate and rhythm, S1, S2 normal, no murmur, click, rub or gallop  Chest wall examination revealed Port-A-Cath site without complications or abnormalities. Lung:chest clear, no wheezing, rales, normal symmetric air entry no dullness to percussion. Abdomin: soft, non-tender, without masses or organomegaly no rebound or guarding. EXT: trace edema bilaterally and she is on today's examination. Neurological: No deficits noted.   Lab Results: Lab Results  Component Value Date   WBC 7.6 02/12/2015   HGB 13.0 02/12/2015   HCT 39.3 02/12/2015   MCV 92.5 02/12/2015   PLT 236 02/12/2015     Chemistry      Component Value Date/Time   NA 138 02/12/2015 0813   NA 140 03/15/2014 0553   K 4.4 02/12/2015 0813   K 3.5* 03/15/2014 0553   CL 107 03/15/2014 0553   CO2 28 02/12/2015 0813   CO2 23 03/15/2014 0553   BUN 16.3 02/12/2015 0813   BUN 30* 03/15/2014 0553   CREATININE 0.9 02/12/2015 0813   CREATININE 0.69 03/15/2014 0553      Component Value Date/Time   CALCIUM 9.8 02/12/2015  0813   CALCIUM 8.1* 03/15/2014 0553   ALKPHOS 120 02/12/2015 0813   ALKPHOS 82 03/15/2014 0553   AST 29 02/12/2015 0813   AST 16 03/15/2014 0553   ALT 20 02/12/2015 0813   ALT 8 03/15/2014 0553   BILITOT 0.68 02/12/2015 0813   BILITOT 0.6 03/15/2014 0553      Results for MASAHIRO, NISSIM (MRN [id number]) as of 03/28/2015 11:49  Ref. Range 01/01/2015 09:15 02/12/2015 08:13  PSA Latest Ref Range: <=4.00 ng/mL 0.64 0.86       Impression and Plan:  74 year old gentleman with the following issues:  1. 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 After progressing on Xtandi, he received systemic chemotherapy in the form of Taxotere between June 2015 till April 2016 for a total of 16 cycles. His PSA dropped down to 0.44 from 176.  He is currently on treatment holiday due to the accumulation of Taxotere side effects.   His PSA continues to rise very slowly but remains under control. His clinical status continues to improve of chemotherapy. I see no indications to start therapy again. This was discussed with the patient and his wife today. His PSA starts to rise rapidly or he develops worsening symptoms related to his cancer progression, we will institute therapy at that time. I will be in the form of systemic chemotherapy or Zytiga.    2. Bone pain: Improved with systemic chemotherapy. No relapse of his pain at this time. He does not require taking any pain medication.  3. Dental issues: No dental surgery is planned he is off Xgeva in any case. He is able to eat and maintain his weight despite that.  4. IV access: Status post right anterior chest Port-A-Cath placement. This will be flushed every 6 weeks.   5. Androgen depravation: This will be continued indefinitely.  6. Followup: Will be in 6 weeks for a follow-up.   [facility], MD  12/8/201611:59 AM

## 2015-03-29 ENCOUNTER — Telehealth: Payer: Self-pay | Admitting: *Deleted

## 2015-03-29 LAB — PSA: PSA: 1.25 ng/mL (ref <=4.00)

## 2015-03-29 NOTE — Telephone Encounter (Signed)
Spoke with wife, gave results of last PSA 

## 2015-03-29 NOTE — Telephone Encounter (Signed)
-----   Message from Wyatt Portela, MD sent at 03/29/2015  9:14 AM EST ----- Please let him know that his PSA. Slightly up but no change for now.

## 2015-04-24 DIAGNOSIS — R05 Cough: Secondary | ICD-10-CM | POA: Diagnosis not present

## 2015-04-24 DIAGNOSIS — J209 Acute bronchitis, unspecified: Secondary | ICD-10-CM | POA: Diagnosis not present

## 2015-05-02 ENCOUNTER — Telehealth (INDEPENDENT_AMBULATORY_CARE_PROVIDER_SITE_OTHER): Payer: Self-pay | Admitting: Internal Medicine

## 2015-05-02 NOTE — Telephone Encounter (Signed)
[name], the pt's wife called saying the Rx you prescribed in October and reduced to one pill a day, she thinks needs to be increased to two pills a day again. The return of a consistent cough has taken place and she said it's like the same cough he had when he initially saw you. She'd like a phone call regarding this.  Pt's ph# [phone] Thank you.

## 2015-05-02 NOTE — Telephone Encounter (Signed)
I told patient's wife they could increase to 2 a day and see if his symptoms improve. She will let me know.

## 2015-05-07 ENCOUNTER — Telehealth (INDEPENDENT_AMBULATORY_CARE_PROVIDER_SITE_OTHER): Payer: Self-pay | Admitting: Internal Medicine

## 2015-05-07 NOTE — Telephone Encounter (Signed)
[name] called saying [name] told her they need a new Rx saying the pt is to take the medication twice per day before they can fill it. She'd like a phone call regarding this if needed.  Pt's ph# [phone] Thank you.

## 2015-05-08 DIAGNOSIS — L989 Disorder of the skin and subcutaneous tissue, unspecified: Secondary | ICD-10-CM | POA: Diagnosis not present

## 2015-05-09 ENCOUNTER — Telehealth (INDEPENDENT_AMBULATORY_CARE_PROVIDER_SITE_OTHER): Payer: Self-pay | Admitting: Internal Medicine

## 2015-05-09 DIAGNOSIS — K219 Gastro-esophageal reflux disease without esophagitis: Secondary | ICD-10-CM

## 2015-05-09 MED ORDER — PANTOPRAZOLE SODIUM 40 MG PO TBEC: 40.0000 mg | DELAYED_RELEASE_TABLET | Freq: Two times a day (BID) | ORAL | Status: DC

## 2015-05-09 NOTE — Telephone Encounter (Signed)
rx sent to his pharmacy 

## 2015-05-09 NOTE — Telephone Encounter (Signed)
Forwarded to Terri to address. 

## 2015-05-09 NOTE — Telephone Encounter (Signed)
Pt's wife [name] called back saying the pharmacy hasn't received needed information from [place] and they're still unable to fill his Rx of Protonix. They need a new Rx saying it's ok for him to take two pills per day. The pt is out of medication and usually takes one in the morning but has none. His wife is wondering if there's something over the counter he can take until the Rx for the Protonix is taken care of. She'd like a phone call regarding this.   Pt's ph# [phone] Thank you.

## 2015-05-14 NOTE — Telephone Encounter (Signed)
This has been addressed.

## 2015-05-15 ENCOUNTER — Ambulatory Visit (HOSPITAL_BASED_OUTPATIENT_CLINIC_OR_DEPARTMENT_OTHER): Payer: Medicare Other | Admitting: Oncology

## 2015-05-15 ENCOUNTER — Other Ambulatory Visit (HOSPITAL_BASED_OUTPATIENT_CLINIC_OR_DEPARTMENT_OTHER): Payer: Medicare Other

## 2015-05-15 ENCOUNTER — Telehealth: Payer: Self-pay | Admitting: Oncology

## 2015-05-15 ENCOUNTER — Ambulatory Visit (HOSPITAL_BASED_OUTPATIENT_CLINIC_OR_DEPARTMENT_OTHER): Payer: Medicare Other

## 2015-05-15 VITALS — BP 137/66 | HR 82 | Temp 97.6°F | Resp 18 | Wt 149.9 lb

## 2015-05-15 DIAGNOSIS — E291 Testicular hypofunction: Secondary | ICD-10-CM

## 2015-05-15 DIAGNOSIS — C61 Malignant neoplasm of prostate: Secondary | ICD-10-CM

## 2015-05-15 DIAGNOSIS — C7951 Secondary malignant neoplasm of bone: Secondary | ICD-10-CM

## 2015-05-15 DIAGNOSIS — Z95828 Presence of other vascular implants and grafts: Secondary | ICD-10-CM

## 2015-05-15 LAB — CBC WITH DIFFERENTIAL/PLATELET
BASO%: 0.7 % (ref 0.0–2.0)
Basophils Absolute: 0.1 10*3/uL (ref 0.0–0.1)
EOS ABS: 0.4 10*3/uL (ref 0.0–0.5)
EOS%: 5.5 % (ref 0.0–7.0)
HCT: 40.1 % (ref 38.4–49.9)
HGB: 13.1 g/dL (ref 13.0–17.1)
LYMPH%: 29.7 % (ref 14.0–49.0)
MCH: 30.3 pg (ref 27.2–33.4)
MCHC: 32.7 g/dL (ref 32.0–36.0)
MCV: 92.6 fL (ref 79.3–98.0)
MONO#: 0.6 10*3/uL (ref 0.1–0.9)
MONO%: 7.5 % (ref 0.0–14.0)
NEUT#: 4.1 10*3/uL (ref 1.5–6.5)
NEUT%: 56.6 % (ref 39.0–75.0)
PLATELETS: 225 10*3/uL (ref 140–400)
RBC: 4.33 10*6/uL (ref 4.20–5.82)
RDW: 13.5 % (ref 11.0–14.6)
WBC: 7.3 10*3/uL (ref 4.0–10.3)
lymph#: 2.2 10*3/uL (ref 0.9–3.3)

## 2015-05-15 LAB — COMPREHENSIVE METABOLIC PANEL
ALT: 18 U/L (ref 0–55)
ANION GAP: 8 meq/L (ref 3–11)
AST: 26 U/L (ref 5–34)
Albumin: 3.5 g/dL (ref 3.5–5.0)
Alkaline Phosphatase: 120 U/L (ref 40–150)
BILIRUBIN TOTAL: 0.66 mg/dL (ref 0.20–1.20)
BUN: 16.6 mg/dL (ref 7.0–26.0)
CALCIUM: 9.4 mg/dL (ref 8.4–10.4)
CO2: 27 mEq/L (ref 22–29)
CREATININE: 0.9 mg/dL (ref 0.7–1.3)
Chloride: 104 mEq/L (ref 98–109)
EGFR: 82 mL/min/{1.73_m2} — ABNORMAL LOW (ref >90)
Glucose: 96 mg/dl (ref 70–140)
Potassium: 4.2 mEq/L (ref 3.5–5.1)
Sodium: 139 mEq/L (ref 136–145)
TOTAL PROTEIN: 6.9 g/dL (ref 6.4–8.3)

## 2015-05-15 MED ORDER — HEPARIN SOD (PORK) LOCK FLUSH 100 UNIT/ML IV SOLN
500.0000 [IU] | Freq: Once | INTRAVENOUS | Status: AC
  Administered 2015-05-15: 500 [IU] via INTRAVENOUS
  Filled 2015-05-15: qty 5

## 2015-05-15 MED ORDER — SODIUM CHLORIDE 0.9% FLUSH
10.0000 mL | INTRAVENOUS | Status: DC | PRN
  Administered 2015-05-15: 10 mL via INTRAVENOUS
  Filled 2015-05-15: qty 10

## 2015-05-15 NOTE — Telephone Encounter (Signed)
Gv pt appt for 3/15.

## 2015-05-15 NOTE — Progress Notes (Signed)
Hematology and Oncology Follow Up Visit  [name] [name] [id number] 05-Mar-1941 75 y.o. 05/15/2015 10:09 AM   Principle Diagnosis: 75 year old gentleman with castration resistant metastatic prostate cancer with disease to the bone. His initial diagnosis was in 2012 where he presented with metastatic disease to the rib. His PSA was 187.   Prior Therapy:  He was found to have a mass in the right axilla near the fourth rib encasing the right with some sclerosis. He underwent a right VATS procedure and resection of the third, fourth and fifth rib and reconstruction with titanium plates under the care of Dr. Arlyce Dice on November 13 2010.  He went on to receive androgen deprivation therapy under the care of Dr. Gaynelle Arabian. His PSA nadir was to 0.1 back in May of 2013 and subsequently started to rise.  He was treated with Provenge in the fall of 2014.  After his PSA went up to 178.6 and Xtandi was started in February of 2015 and discontinued in 09/2013 due to progression of disease and poor tolerance. Taxotere chemotherapy started on 10/05/2013. He is status post 16 cycles of therapy last one given in April 2016 with a PSA reduction down to 0.44.   Current therapy: Observation and surveillance.  Interim History:  [name] [name] presents today for a followup visit with his wife. Since his last visit, he reports no recent complaints. He continues to have excellent quality of life with improvement in his appetite and weight. He does report some occasional low back pain but has been postural in nature. He continues to ambulate without any major difficulties and has not reported any falls or syncope.     He does not report any headaches or blurry vision or double vision. Has not reported any syncope or change in his mental status. He is not reporting any chest pain or shortness of breath.  Does not report any palpitation or leg edema. Does not report any cough, hemoptysis or wheezing. Does not report any frequency  urgency or hesitancy. Does not report any skeletal complications. Does not report any lymphadenopathy or petechiae. Remainder of his review of systems unremarkable.  Medications: I have reviewed the patient's current medications.  Current Outpatient Prescriptions  Medication Sig Dispense Refill  . acetaminophen (TYLENOL) 325 MG tablet Take 650 mg by mouth every 6 (six) hours as needed for moderate pain.     Marland Kitchen aspirin EC 81 MG tablet Take 81 mg by mouth at bedtime.    . Calcium Citrate-Vitamin D (CITRACAL + D PO) Take 3 tablets by mouth every morning.    . cholecalciferol (VITAMIN D) 400 UNITS TABS tablet Take 1,500 Units by mouth.    . ferrous sulfate 325 (65 FE) MG tablet Take 325 mg by mouth daily with breakfast.    . ibuprofen (ADVIL,MOTRIN) 200 MG tablet Take 200 mg by mouth every 4 (four) hours as needed for moderate pain.    Marland Kitchen lidocaine-prilocaine (EMLA) cream Apply 1 application topically as needed. 30 g 2  . mirtazapine (REMERON) 15 MG tablet TAKE 1 TABLET BY MOUTH AT BEDTIME 30 tablet 1  . pantoprazole (PROTONIX) 40 MG tablet Take 1 tablet (40 mg total) by mouth 2 (two) times daily before a meal. 60 tablet 5  . prochlorperazine (COMPAZINE) 10 MG tablet Take 1 tablet (10 mg total) by mouth every 6 (six) hours as needed for nausea or vomiting. 30 tablet 0  . simvastatin (ZOCOR) 40 MG tablet Take 40 mg by mouth daily.  No current facility-administered medications for this visit.   Facility-Administered Medications Ordered in Other Visits  Medication Dose Route Frequency Provider Last Rate Last Dose  . sodium chloride 0.9 % injection 10 mL  10 mL Intracatheter PRN Wyatt Portela, MD   10 mL at 06/13/14 1341     Allergies:  Allergies  Allergen Reactions  . Oxycodone Nausea And Vomiting  . Penicillins Itching and Rash    Past Medical History, Surgical history, Social history, and Family History were reviewed and updated.   Physical Exam: Blood pressure 137/66, pulse 82,  temperature 97.6 F (36.4 C), temperature source Oral, resp. rate 18, weight 149 lb 14.4 oz (67.994 kg), SpO2 98 %. ECOG:  1 General appearance: alert and cooperative appeared without distress.  Head: Normocephalic, without obvious abnormality no oral thrush noted.  Neck: no adenopathy or tenderness. Lymph nodes: Cervical, supraclavicular, and axillary nodes normal. Heart:regular rate and rhythm, S1, S2 normal, no murmur, click, rub or gallop  Chest wall examination revealed Port-A-Cath site without erythema or induration.  Lung:chest clear, no wheezing, rales, normal symmetric air entry no dullness to percussion. Abdomin: soft, non-tender, without masses or organomegaly no shifting dullness or ascites.  EXT: trace edema bilaterally and she is on today's examination. Neurological: No deficits noted.   Lab Results: Lab Results  Component Value Date   WBC 7.3 05/15/2015   HGB 13.1 05/15/2015   HCT 40.1 05/15/2015   MCV 92.6 05/15/2015   PLT 225 05/15/2015     Chemistry      Component Value Date/Time   NA 139 05/15/2015 0911   NA 140 03/15/2014 0553   K 4.2 05/15/2015 0911   K 3.5* 03/15/2014 0553   CL 107 03/15/2014 0553   CO2 27 05/15/2015 0911   CO2 23 03/15/2014 0553   BUN 16.6 05/15/2015 0911   BUN 30* 03/15/2014 0553   CREATININE 0.9 05/15/2015 0911   CREATININE 0.69 03/15/2014 0553      Component Value Date/Time   CALCIUM 9.4 05/15/2015 0911   CALCIUM 8.1* 03/15/2014 0553   ALKPHOS 120 05/15/2015 0911   ALKPHOS 82 03/15/2014 0553   AST 26 05/15/2015 0911   AST 16 03/15/2014 0553   ALT 18 05/15/2015 0911   ALT 8 03/15/2014 0553   BILITOT 0.66 05/15/2015 0911   BILITOT 0.6 03/15/2014 0553       Results for [name] [name], [name] [name] (MRN [id number]) as of 05/15/2015 09:34  Ref. Range 01/01/2015 09:15 02/12/2015 08:13 03/28/2015 11:45  PSA Latest Ref Range: <=4.00 ng/mL 0.64 0.86 1.25       Impression and Plan:  75 year old gentleman with the following  issues:  1. 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 After progressing on Xtandi, he received systemic chemotherapy in the form of Taxotere between June 2015 till April 2016 for a total of 16 cycles. His PSA dropped down to 0.44 from 176.  He is currently on observation and surveillance. His PSA is climbing up slowly most recently was up to 1.25. The plan is to continue on observation and surveillance for the time being and reinstitute treatment upon symptomatic progression. His quality of life is very good at this time and would like to avoid interfering with it.     2. Bone pain: Improved with systemic chemotherapy.   3. Dental issues: Will continue to hold off Xgeva at this time.   4. IV access: Status post right anterior chest Port-A-Cath placement. This will be flushed every  6 weeks.   5. Androgen depravation: This will be continued indefinitely.  6. Followup: Will be in 6 weeks for a follow-up.   [facility], MD  1/25/201710:09 AM

## 2015-05-15 NOTE — Patient Instructions (Signed)

## 2015-05-16 LAB — PSA: PROSTATE SPECIFIC AG, SERUM: 2.1 ng/mL (ref 0.0–4.0)

## 2015-05-16 LAB — PSA (PARALLEL TESTING): PSA: 2.01 ng/mL (ref <=4.00)

## 2015-06-11 ENCOUNTER — Telehealth: Payer: Self-pay | Admitting: Oncology

## 2015-06-11 NOTE — Telephone Encounter (Signed)
Faxed labs to Dr. Sabra Heck [phone] release id)

## 2015-07-03 ENCOUNTER — Ambulatory Visit (HOSPITAL_BASED_OUTPATIENT_CLINIC_OR_DEPARTMENT_OTHER): Payer: Medicare Other | Admitting: Oncology

## 2015-07-03 ENCOUNTER — Other Ambulatory Visit (HOSPITAL_BASED_OUTPATIENT_CLINIC_OR_DEPARTMENT_OTHER): Payer: Medicare Other

## 2015-07-03 ENCOUNTER — Telehealth: Payer: Self-pay | Admitting: Oncology

## 2015-07-03 ENCOUNTER — Ambulatory Visit (HOSPITAL_BASED_OUTPATIENT_CLINIC_OR_DEPARTMENT_OTHER): Payer: Medicare Other

## 2015-07-03 VITALS — BP 141/78 | HR 78 | Temp 97.6°F | Resp 18 | Wt 149.4 lb

## 2015-07-03 DIAGNOSIS — C61 Malignant neoplasm of prostate: Secondary | ICD-10-CM | POA: Diagnosis not present

## 2015-07-03 DIAGNOSIS — C7951 Secondary malignant neoplasm of bone: Secondary | ICD-10-CM

## 2015-07-03 DIAGNOSIS — E291 Testicular hypofunction: Secondary | ICD-10-CM | POA: Diagnosis not present

## 2015-07-03 DIAGNOSIS — Z95828 Presence of other vascular implants and grafts: Secondary | ICD-10-CM

## 2015-07-03 LAB — COMPREHENSIVE METABOLIC PANEL
ALT: 17 U/L (ref 0–55)
ANION GAP: 8 meq/L (ref 3–11)
AST: 24 U/L (ref 5–34)
Albumin: 3.5 g/dL (ref 3.5–5.0)
Alkaline Phosphatase: 99 U/L (ref 40–150)
BILIRUBIN TOTAL: 0.6 mg/dL (ref 0.20–1.20)
BUN: 19.8 mg/dL (ref 7.0–26.0)
CHLORIDE: 105 meq/L (ref 98–109)
CO2: 26 meq/L (ref 22–29)
Calcium: 9.2 mg/dL (ref 8.4–10.4)
Creatinine: 1 mg/dL (ref 0.7–1.3)
EGFR: 71 mL/min/{1.73_m2} — AB (ref >90)
Glucose: 127 mg/dl (ref 70–140)
POTASSIUM: 3.5 meq/L (ref 3.5–5.1)
Sodium: 139 mEq/L (ref 136–145)
TOTAL PROTEIN: 6.5 g/dL (ref 6.4–8.3)

## 2015-07-03 LAB — CBC WITH DIFFERENTIAL/PLATELET
BASO%: 1.2 % (ref 0.0–2.0)
BASOS ABS: 0.1 10*3/uL (ref 0.0–0.1)
EOS ABS: 0.3 10*3/uL (ref 0.0–0.5)
EOS%: 4.8 % (ref 0.0–7.0)
HCT: 38.2 % — ABNORMAL LOW (ref 38.4–49.9)
HGB: 12.5 g/dL — ABNORMAL LOW (ref 13.0–17.1)
LYMPH%: 32.4 % (ref 14.0–49.0)
MCH: 30.1 pg (ref 27.2–33.4)
MCHC: 32.6 g/dL (ref 32.0–36.0)
MCV: 92.4 fL (ref 79.3–98.0)
MONO#: 0.5 10*3/uL (ref 0.1–0.9)
MONO%: 7.7 % (ref 0.0–14.0)
NEUT#: 3.4 10*3/uL (ref 1.5–6.5)
NEUT%: 53.9 % (ref 39.0–75.0)
PLATELETS: 222 10*3/uL (ref 140–400)
RBC: 4.14 10*6/uL — AB (ref 4.20–5.82)
RDW: 13.8 % (ref 11.0–14.6)
WBC: 6.3 10*3/uL (ref 4.0–10.3)
lymph#: 2 10*3/uL (ref 0.9–3.3)

## 2015-07-03 MED ORDER — HEPARIN SOD (PORK) LOCK FLUSH 100 UNIT/ML IV SOLN
500.0000 [IU] | Freq: Once | INTRAVENOUS | Status: AC
  Administered 2015-07-03: 500 [IU] via INTRAVENOUS
  Filled 2015-07-03: qty 5

## 2015-07-03 MED ORDER — SODIUM CHLORIDE 0.9% FLUSH
10.0000 mL | INTRAVENOUS | Status: DC | PRN
  Administered 2015-07-03: 10 mL via INTRAVENOUS
  Filled 2015-07-03: qty 10

## 2015-07-03 NOTE — Patient Instructions (Signed)

## 2015-07-03 NOTE — Telephone Encounter (Signed)
per pof to sch pt appt-gave pt copy of avs °

## 2015-07-03 NOTE — Progress Notes (Signed)
Hematology and Oncology Follow Up Visit  Brom [place] [id number] 05-27-1940 75 y.o. 07/03/2015 9:27 AM   Principle Diagnosis: 75 year old gentleman with castration resistant metastatic prostate cancer with disease to the bone. His initial diagnosis was in 2012 where he presented with metastatic disease to the rib. His PSA was 187.   Prior Therapy:  He was found to have a mass in the right axilla near the fourth rib encasing the right with some sclerosis. He underwent a right VATS procedure and resection of the third, fourth and fifth rib and reconstruction with titanium plates under the care of Dr. Arlyce Dice on November 13 2010.  He went on to receive androgen deprivation therapy under the care of Dr. Gaynelle Arabian. His PSA nadir was to 0.1 back in May of 2013 and subsequently started to rise.  He was treated with Provenge in the fall of 2014.  After his PSA went up to 178.6 and Xtandi was started in February of 2015 and discontinued in 09/2013 due to progression of disease and poor tolerance. Taxotere chemotherapy started on 10/05/2013. He is status post 16 cycles of therapy last one given in April 2016 with a PSA reduction down to 0.44.   Current therapy: Observation and surveillance.  Interim History:  [name] [name] presents today for a followup visit with his wife. Since his last visit, he continues to do very well and thrive off treatment. He has gained more weight and performance status and activity level continue to improve.He does report some occasional low back pain but have not changed dramatically. He continues to ambulate without any major difficulties and has not reported any falls or syncope. He denied any recent hospitalization or illnesses. He denied any recurrent infections.    He does not report any headaches or blurry vision or double vision. Has not reported any syncope or change in his mental status. He is not reporting any chest pain or shortness of breath.  Does not report any  palpitation or leg edema. Does not report any cough, hemoptysis or wheezing. Does not report any frequency urgency or hesitancy. Does not report any skeletal complications. Does not report any lymphadenopathy or petechiae. Remainder of his review of systems unremarkable.  Medications: I have reviewed the patient's current medications.  Current Outpatient Prescriptions  Medication Sig Dispense Refill  . acetaminophen (TYLENOL) 325 MG tablet Take 650 mg by mouth every 6 (six) hours as needed for moderate pain.     Marland Kitchen aspirin EC 81 MG tablet Take 81 mg by mouth at bedtime.    . Calcium Citrate-Vitamin D (CITRACAL + D PO) Take 3 tablets by mouth every morning.    . cholecalciferol (VITAMIN D) 400 UNITS TABS tablet Take 1,500 Units by mouth.    . ferrous sulfate 325 (65 FE) MG tablet Take 325 mg by mouth daily with breakfast.    . ibuprofen (ADVIL,MOTRIN) 200 MG tablet Take 200 mg by mouth every 4 (four) hours as needed for moderate pain.    Marland Kitchen lidocaine-prilocaine (EMLA) cream Apply 1 application topically as needed. 30 g 2  . mirtazapine (REMERON) 15 MG tablet TAKE 1 TABLET BY MOUTH AT BEDTIME 30 tablet 1  . pantoprazole (PROTONIX) 40 MG tablet Take 1 tablet (40 mg total) by mouth 2 (two) times daily before a meal. 60 tablet 5  . prochlorperazine (COMPAZINE) 10 MG tablet Take 1 tablet (10 mg total) by mouth every 6 (six) hours as needed for nausea or vomiting. 30 tablet 0  . simvastatin (ZOCOR)  40 MG tablet Take 40 mg by mouth daily.     No current facility-administered medications for this visit.   Facility-Administered Medications Ordered in Other Visits  Medication Dose Route Frequency Provider Last Rate Last Dose  . sodium chloride 0.9 % injection 10 mL  10 mL Intracatheter PRN Wyatt Portela, MD   10 mL at 06/13/14 1341     Allergies:  Allergies  Allergen Reactions  . Oxycodone Nausea And Vomiting  . Penicillins Itching and Rash    Past Medical History, Surgical history, Social  history, and Family History were reviewed and updated.   Physical Exam: Blood pressure 141/78, pulse 78, temperature 97.6 F (36.4 C), temperature source Oral, resp. rate 18, weight 149 lb 6.4 oz (67.767 kg), SpO2 98 %. ECOG:  1 General appearance: alert and cooperative without distress. Head: Normocephalic, without obvious abnormality no oral ulcers or lesions. Neck: no adenopathy or tenderness. Lymph nodes: Cervical, supraclavicular, and axillary nodes normal. Heart:regular rate and rhythm, S1, S2 normal, no murmur, click, rub or gallop  Chest wall examination revealed Port-A-Cath site without erythema or induration.  Lung:chest clear, no wheezing, rales, normal symmetric air entry no dullness to percussion. Abdomin: soft, non-tender, without masses or organomegaly no rebound or guarding. EXT: 1+ edema noted in the ankles. Neurological: No deficits noted.   Lab Results: Lab Results  Component Value Date   WBC 6.3 07/03/2015   HGB 12.5* 07/03/2015   HCT 38.2* 07/03/2015   MCV 92.4 07/03/2015   PLT 222 07/03/2015     Chemistry      Component Value Date/Time   NA 139 05/15/2015 0911   NA 140 03/15/2014 0553   K 4.2 05/15/2015 0911   K 3.5* 03/15/2014 0553   CL 107 03/15/2014 0553   CO2 27 05/15/2015 0911   CO2 23 03/15/2014 0553   BUN 16.6 05/15/2015 0911   BUN 30* 03/15/2014 0553   CREATININE 0.9 05/15/2015 0911   CREATININE 0.69 03/15/2014 0553      Component Value Date/Time   CALCIUM 9.4 05/15/2015 0911   CALCIUM 8.1* 03/15/2014 0553   ALKPHOS 120 05/15/2015 0911   ALKPHOS 82 03/15/2014 0553   AST 26 05/15/2015 0911   AST 16 03/15/2014 0553   ALT 18 05/15/2015 0911   ALT 8 03/15/2014 0553   BILITOT 0.66 05/15/2015 0911   BILITOT 0.6 03/15/2014 0553         Results for [name] [name], [name] [name] (MRN [id number]) as of 07/03/2015 09:17  Ref. Range 03/28/2015 11:45 05/15/2015 09:11 05/15/2015 09:11  PSA Latest Ref Range: 0.0-4.0 ng/mL 1.25 2.01 2.1      Impression  and Plan:  75 year old gentleman with the following issues:  1. 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 After progressing on Xtandi, he received systemic chemotherapy in the form of Taxotere between June 2015 till April 2016 for a total of 16 cycles. His PSA dropped down to 0.44 from 176.  He is currently on observation and surveillance. His PSA is climbing up slowly most recently was up to 2.0. The plan is to continue on observation and surveillance for the time being and reinstitute treatment upon symptomatic progression. If his PSA starts to rise rapidly, we will stage him with a CT scan and a bone scan and consider treatment at that time. He prefers to defer treatment given his excellent quality of life at this time.    2. Bone pain: arthritic in nature without increased from his cancer  bone pain.  3. Dental issues: Will continue to hold off Xgeva at this time based on his request.  4. IV access: Status post right anterior chest Port-A-Cath placement. This will be flushed every 6 weeks.   5. Androgen depravation: This will be continued indefinitely. No issues reported with this treatment.  6. Followup: Will be in 6 weeks for a follow-up.   [facility], MD  3/15/20179:27 AM

## 2015-07-04 ENCOUNTER — Telehealth: Payer: Self-pay | Admitting: *Deleted

## 2015-07-04 LAB — PSA (PARALLEL TESTING): PSA: 3.64 ng/mL (ref <=4.00)

## 2015-07-04 LAB — PSA: Prostate Specific Ag, Serum: 4.3 ng/mL — ABNORMAL HIGH (ref 0.0–4.0)

## 2015-07-04 NOTE — Telephone Encounter (Signed)
As noted below by Dr. Shadad, I informed patient's wife of PSA level. She verbalized understanding.  

## 2015-07-04 NOTE — Telephone Encounter (Signed)
-----   Message from Wyatt Portela, MD sent at 07/04/2015 11:18 AM EDT ----- Please let him know about the PSA. Up but no need to change anything.

## 2015-08-13 ENCOUNTER — Other Ambulatory Visit: Payer: Self-pay

## 2015-08-13 ENCOUNTER — Ambulatory Visit (INDEPENDENT_AMBULATORY_CARE_PROVIDER_SITE_OTHER): Payer: Medicare Other | Admitting: Internal Medicine

## 2015-08-13 ENCOUNTER — Encounter (INDEPENDENT_AMBULATORY_CARE_PROVIDER_SITE_OTHER): Payer: Self-pay | Admitting: Internal Medicine

## 2015-08-13 VITALS — BP 92/60 | HR 70 | Temp 98.1°F | Resp 18 | Ht 66.0 in | Wt 140.8 lb

## 2015-08-13 DIAGNOSIS — K2270 Barrett's esophagus without dysplasia: Secondary | ICD-10-CM

## 2015-08-13 DIAGNOSIS — K2100 Gastro-esophageal reflux disease with esophagitis, without bleeding: Secondary | ICD-10-CM

## 2015-08-13 DIAGNOSIS — Z95828 Presence of other vascular implants and grafts: Secondary | ICD-10-CM | POA: Insufficient documentation

## 2015-08-13 NOTE — Progress Notes (Signed)
Presenting complaint;  Follow-up for complicated GERD.  Database:  Patient is 75 year old Caucasian male who is here for scheduled visit. He is accompanied by his wife [name]. He was last seen in August 2016. 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 Follow-up exam was planned but postponed because he is dealing with metastatic prostate carcinoma. PPI dose reduction was attempted last year with he began to have coughing spells. Therefore dose was doubled.  Subjective:  Patient has no complaints. He does not even remember the last time he had an episode of heartburn or regurgitation. He denies dysphagia. He has good appetite. His bowels move every day like clockwork. He denies melena or rectal bleeding. He has lost 4 pounds since his last visit. His wife believes it because he was having dental work done. He had have 7 root canals. It was hard for him to eat. Now he is able to eat without any difficulty. He sleeps in a recliner. He has no problem. He's been doing this since November 2015.    Current Medications: Outpatient Encounter Prescriptions as of 08/13/2015  Medication Sig  . acetaminophen (TYLENOL) 325 MG tablet Take 650 mg by mouth every 6 (six) hours as needed for moderate pain.   Marland Kitchen aspirin EC 81 MG tablet Take 81 mg by mouth at bedtime.  . Calcium Citrate-Vitamin D (CITRACAL + D PO) Take 3 tablets by mouth every morning.  . cholecalciferol (VITAMIN D) 400 UNITS TABS tablet Take 1,500 Units by mouth.  . ferrous sulfate 325 (65 FE) MG tablet Take 325 mg by mouth daily with breakfast.  . ibuprofen (ADVIL,MOTRIN) 200 MG tablet Take 200 mg by mouth every 4 (four) hours as needed for moderate pain.  Marland Kitchen lidocaine-prilocaine (EMLA) cream Apply 1 application topically as needed.  . pantoprazole (PROTONIX) 40 MG tablet Take 1 tablet (40 mg total) by mouth 2 (two) times daily  before a meal.  . simvastatin (ZOCOR) 40 MG tablet Take 40 mg by mouth daily.  . mirtazapine (REMERON) 15 MG tablet TAKE 1 TABLET BY MOUTH AT BEDTIME (Patient not taking: Reported on 08/13/2015)  . prochlorperazine (COMPAZINE) 10 MG tablet Take 1 tablet (10 mg total) by mouth every 6 (six) hours as needed for nausea or vomiting. (Patient not taking: Reported on 08/13/2015)   Facility-Administered Encounter Medications as of 08/13/2015  Medication  . sodium chloride 0.9 % injection 10 mL     Objective: Blood pressure 92/60, pulse 70, temperature 98.1 F (36.7 C), temperature source Oral, resp. rate 18, height 5\' 6"  (1.676 m), weight 140 lb 12.8 oz (63.866 kg). Patient is alert and in no acute distress. Conjunctiva is pink. Sclera is nonicteric Oropharyngeal mucosa is normal. No neck masses or thyromegaly noted. Cardiac exam with regular rhythm normal S1 and S2. No murmur or gallop noted. Lungs are clear to auscultation. Abdomen abdomen is flat soft and nontender without organomegaly or masses. No LE edema or clubbing noted.   Assessment:  #1. 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 Patient and his wife are comfortable with continued therapy for reflux and not pursue with follow-up EGD unless he has dysphagia or therapy becomes ineffective.   Plan:  Continue pantoprazole at 40 mg by mouth twice a day scheduled. Keep ibuprofen use to minimum. Office visit in 6 months

## 2015-08-13 NOTE — Patient Instructions (Signed)
Call if you have swallowing difficulty or medication quits working.

## 2015-08-14 ENCOUNTER — Other Ambulatory Visit (HOSPITAL_BASED_OUTPATIENT_CLINIC_OR_DEPARTMENT_OTHER): Payer: Medicare Other

## 2015-08-14 ENCOUNTER — Telehealth: Payer: Self-pay | Admitting: Oncology

## 2015-08-14 ENCOUNTER — Ambulatory Visit (HOSPITAL_BASED_OUTPATIENT_CLINIC_OR_DEPARTMENT_OTHER): Payer: Medicare Other

## 2015-08-14 ENCOUNTER — Ambulatory Visit (HOSPITAL_BASED_OUTPATIENT_CLINIC_OR_DEPARTMENT_OTHER): Payer: Medicare Other | Admitting: Oncology

## 2015-08-14 VITALS — BP 117/74 | HR 72 | Temp 97.7°F | Resp 18 | Ht 66.0 in | Wt 141.8 lb

## 2015-08-14 DIAGNOSIS — M898X9 Other specified disorders of bone, unspecified site: Secondary | ICD-10-CM

## 2015-08-14 DIAGNOSIS — C7951 Secondary malignant neoplasm of bone: Secondary | ICD-10-CM

## 2015-08-14 DIAGNOSIS — E291 Testicular hypofunction: Secondary | ICD-10-CM | POA: Diagnosis not present

## 2015-08-14 DIAGNOSIS — C61 Malignant neoplasm of prostate: Secondary | ICD-10-CM | POA: Diagnosis not present

## 2015-08-14 DIAGNOSIS — Z95828 Presence of other vascular implants and grafts: Secondary | ICD-10-CM

## 2015-08-14 LAB — COMPREHENSIVE METABOLIC PANEL
ALT: 15 U/L (ref 0–55)
ANION GAP: 6 meq/L (ref 3–11)
AST: 21 U/L (ref 5–34)
Albumin: 3.3 g/dL — ABNORMAL LOW (ref 3.5–5.0)
Alkaline Phosphatase: 105 U/L (ref 40–150)
BILIRUBIN TOTAL: 0.53 mg/dL (ref 0.20–1.20)
BUN: 15.8 mg/dL (ref 7.0–26.0)
CHLORIDE: 105 meq/L (ref 98–109)
CO2: 28 meq/L (ref 22–29)
Calcium: 9.2 mg/dL (ref 8.4–10.4)
Creatinine: 0.8 mg/dL (ref 0.7–1.3)
EGFR: 89 mL/min/{1.73_m2} — AB (ref >90)
GLUCOSE: 76 mg/dL (ref 70–140)
POTASSIUM: 4.3 meq/L (ref 3.5–5.1)
SODIUM: 138 meq/L (ref 136–145)
TOTAL PROTEIN: 6.4 g/dL (ref 6.4–8.3)

## 2015-08-14 LAB — CBC WITH DIFFERENTIAL/PLATELET
BASO%: 0.6 % (ref 0.0–2.0)
BASOS ABS: 0.1 10*3/uL (ref 0.0–0.1)
EOS ABS: 0.3 10*3/uL (ref 0.0–0.5)
EOS%: 3.2 % (ref 0.0–7.0)
HCT: 36.3 % — ABNORMAL LOW (ref 38.4–49.9)
HGB: 12 g/dL — ABNORMAL LOW (ref 13.0–17.1)
LYMPH%: 25.1 % (ref 14.0–49.0)
MCH: 30.8 pg (ref 27.2–33.4)
MCHC: 33.1 g/dL (ref 32.0–36.0)
MCV: 93.1 fL (ref 79.3–98.0)
MONO#: 0.8 10*3/uL (ref 0.1–0.9)
MONO%: 9.5 % (ref 0.0–14.0)
NEUT#: 5 10*3/uL (ref 1.5–6.5)
NEUT%: 61.6 % (ref 39.0–75.0)
PLATELETS: 238 10*3/uL (ref 140–400)
RBC: 3.9 10*6/uL — AB (ref 4.20–5.82)
RDW: 13.8 % (ref 11.0–14.6)
WBC: 8.2 10*3/uL (ref 4.0–10.3)
lymph#: 2.1 10*3/uL (ref 0.9–3.3)

## 2015-08-14 MED ORDER — HEPARIN SOD (PORK) LOCK FLUSH 100 UNIT/ML IV SOLN
500.0000 [IU] | Freq: Once | INTRAVENOUS | Status: AC | PRN
  Administered 2015-08-14: 500 [IU] via INTRAVENOUS
  Filled 2015-08-14: qty 5

## 2015-08-14 MED ORDER — SODIUM CHLORIDE 0.9 % IJ SOLN
10.0000 mL | INTRAMUSCULAR | Status: DC | PRN
  Administered 2015-08-14: 10 mL via INTRAVENOUS
  Filled 2015-08-14: qty 10

## 2015-08-14 NOTE — Telephone Encounter (Signed)
Gave pt appt & avs °

## 2015-08-14 NOTE — Patient Instructions (Signed)

## 2015-08-14 NOTE — Progress Notes (Signed)
Hematology and Oncology Follow Up Visit  [name] [name] [id number] 03-19-1941 75 y.o. 08/14/2015 12:33 PM   Principle Diagnosis: 75 year old gentleman with castration resistant metastatic prostate cancer with disease to the bone. His initial diagnosis was in 2012 where he presented with metastatic disease to the rib. His PSA was 187.   Prior Therapy:  He was found to have a mass in the right axilla near the fourth rib encasing the right with some sclerosis. He underwent a right VATS procedure and resection of the third, fourth and fifth rib and reconstruction with titanium plates under the care of Dr. Arlyce Dice on November 13 2010.  He went on to receive androgen deprivation therapy under the care of Dr. Gaynelle Arabian. His PSA nadir was to 0.1 back in May of 2013 and subsequently started to rise.  He was treated with Provenge in the fall of 2014.  After his PSA went up to 178.6 and Xtandi was started in February of 2015 and discontinued in 09/2013 due to progression of disease and poor tolerance. Taxotere chemotherapy started on 10/05/2013. He is status post 16 cycles of therapy last one given in April 2016 with a PSA reduction down to 0.44.   Current therapy: Observation and surveillance.  Interim History:  [name] [name] presents today for a followup visit with his wife. Since his last visit, he reports no major changes in his health. He did have dental surgery which have affected his ability to eat and of lost close to 5 pounds since the last visit. His surgery was 2 weeks ago and is slowly improving at this time. He continues to ambulate without any major difficulties and has not reported any falls or syncope. He denied any recent hospitalization or illnesses. He denied any increase in his pain level including no recent back pain or shoulder pain. He continues to have chronic neck pain which is unchanged. His quality of life has not changed dramatically.    He does not report any headaches or blurry  vision or double vision. Has not reported any syncope or change in his mental status. He is not reporting any chest pain or shortness of breath.  Does not report any palpitation or leg edema. Does not report any cough, hemoptysis or wheezing. Does not report any frequency urgency or hesitancy. Does not report any skeletal complications. Does not report any lymphadenopathy or petechiae. Remainder of his review of systems unremarkable.  Medications: I have reviewed the patient's current medications.  Current Outpatient Prescriptions  Medication Sig Dispense Refill  . acetaminophen (TYLENOL) 325 MG tablet Take 650 mg by mouth every 6 (six) hours as needed for moderate pain.     Marland Kitchen aspirin EC 81 MG tablet Take 81 mg by mouth at bedtime.    . Calcium Citrate-Vitamin D (CITRACAL + D PO) Take 3 tablets by mouth every morning.    . cholecalciferol (VITAMIN D) 400 UNITS TABS tablet Take 1,500 Units by mouth.    . ferrous sulfate 325 (65 FE) MG tablet Take 325 mg by mouth daily with breakfast.    . ibuprofen (ADVIL,MOTRIN) 200 MG tablet Take 200 mg by mouth every 4 (four) hours as needed for moderate pain.    Marland Kitchen lidocaine-prilocaine (EMLA) cream Apply 1 application topically as needed. 30 g 2  . pantoprazole (PROTONIX) 40 MG tablet Take 1 tablet (40 mg total) by mouth 2 (two) times daily before a meal. 60 tablet 5  . prochlorperazine (COMPAZINE) 10 MG tablet Take 1 tablet (10 mg  total) by mouth every 6 (six) hours as needed for nausea or vomiting. 30 tablet 0  . mirtazapine (REMERON) 15 MG tablet TAKE 1 TABLET BY MOUTH AT BEDTIME (Patient not taking: Reported on 08/14/2015) 30 tablet 1  . simvastatin (ZOCOR) 40 MG tablet Take 40 mg by mouth daily. Reported on 08/14/2015     No current facility-administered medications for this visit.   Facility-Administered Medications Ordered in Other Visits  Medication Dose Route Frequency Provider Last Rate Last Dose  . sodium chloride 0.9 % injection 10 mL  10 mL  Intracatheter PRN [name] Portela, MD   10 mL at 06/13/14 1341     Allergies:  Allergies  Allergen Reactions  . Oxycodone Nausea And Vomiting  . Penicillins Itching and Rash    Past Medical History, Surgical history, Social history, and Family History were reviewed and updated.   Physical Exam: Blood pressure 117/74, pulse 72, temperature 97.7 F (36.5 C), temperature source Oral, resp. rate 18, height 5\' 6"  (1.676 m), weight 141 lb 12.8 oz (64.32 kg), SpO2 98 %. ECOG:  1 General appearance: Alert, awake gentleman without distress. Head: Normocephalic, without obvious abnormality no oral thrush noted. His gums are healing properly. Neck: no adenopathy or tenderness. Lymph nodes: Cervical, supraclavicular, and axillary nodes normal. Heart:regular rate and rhythm, S1, S2 normal, no murmur, click, rub or gallop  Chest wall examination revealed Port-A-Cath site appeared normal without erythema. Lung:chest clear, no wheezing, rales, normal symmetric air entry no dullness to percussion. Abdomin: soft, non-tender, without masses or organomegaly no shifting dullness or ascites. EXT: Trace edema noted. Neurological: No deficits noted.   Lab Results: Lab Results  Component Value Date   WBC 8.2 08/14/2015   HGB 12.0* 08/14/2015   HCT 36.3* 08/14/2015   MCV 93.1 08/14/2015   PLT 238 08/14/2015     Chemistry      Component Value Date/Time   NA 139 07/03/2015 0851   NA 140 03/15/2014 0553   K 3.5 07/03/2015 0851   K 3.5* 03/15/2014 0553   CL 107 03/15/2014 0553   CO2 26 07/03/2015 0851   CO2 23 03/15/2014 0553   BUN 19.8 07/03/2015 0851   BUN 30* 03/15/2014 0553   CREATININE 1.0 07/03/2015 0851   CREATININE 0.69 03/15/2014 0553      Component Value Date/Time   CALCIUM 9.2 07/03/2015 0851   CALCIUM 8.1* 03/15/2014 0553   ALKPHOS 99 07/03/2015 0851   ALKPHOS 82 03/15/2014 0553   AST 24 07/03/2015 0851   AST 16 03/15/2014 0553   ALT 17 07/03/2015 0851   ALT 8 03/15/2014  0553   BILITOT 0.60 07/03/2015 0851   BILITOT 0.6 03/15/2014 0553      Results for [name] [name], [name] [name] (MRN [id number]) as of 08/14/2015 12:34  Ref. Range 05/15/2015 09:11 07/03/2015 08:51 07/03/2015 08:51  PSA Latest Ref Range: 0.0-4.0 ng/mL 2.1 3.64 4.3 (H)          Impression and Plan:  75 year old gentleman with the following issues:  1. 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 After progressing on Xtandi, he received systemic chemotherapy in the form of Taxotere between June 2015 till April 2016 for a total of 16 cycles. His PSA dropped down to 0.44 from 176.  He is currently on observation and surveillance. His PSA is climbing up slowly most recently was up to 4.3. The plan is to repeat staging workup including CT scan and a bone scan in 6 weeks and  recheck his PSA. If his PSA continues to rise rapidly, or if he becomes symptomatic, or his imaging studies showed progression of disease then different salvage therapy will be offered. I will be in the form of Zytiga or systemic chemotherapy.    2. Bone pain: arthritic in nature without any recent changes.  3. Dental issues: Will continue to hold off Xgeva at this time based on his request.  4. IV access: Status post right anterior chest Port-A-Cath placement. This will be flushed every 6 weeks.   5. Androgen depravation: To be continued indefinitely. He reports no complications related to that.  6. Followup: Will be in 6 weeks for a follow-up.   Zola Button, MD  4/26/201712:33 PM

## 2015-08-15 ENCOUNTER — Telehealth: Payer: Self-pay | Admitting: *Deleted

## 2015-08-15 LAB — PSA: PROSTATE SPECIFIC AG, SERUM: 5.4 ng/mL — AB (ref 0.0–4.0)

## 2015-08-15 NOTE — Telephone Encounter (Signed)
-----   Message from Wyatt Portela, MD sent at 08/15/2015  9:36 AM EDT ----- Please call his PSA. Slight change not much.

## 2015-08-15 NOTE — Telephone Encounter (Signed)
As noted below by Dr. Alen Blew, I left a message on his cell phone informing him off his PSA level. Instructed him to call [facility] if he had any questions or concerns.

## 2015-09-30 ENCOUNTER — Ambulatory Visit (HOSPITAL_BASED_OUTPATIENT_CLINIC_OR_DEPARTMENT_OTHER): Payer: Medicare Other

## 2015-09-30 ENCOUNTER — Other Ambulatory Visit (HOSPITAL_BASED_OUTPATIENT_CLINIC_OR_DEPARTMENT_OTHER): Payer: Medicare Other

## 2015-09-30 VITALS — BP 101/66 | HR 62 | Temp 97.7°F | Resp 20

## 2015-09-30 DIAGNOSIS — C61 Malignant neoplasm of prostate: Secondary | ICD-10-CM | POA: Diagnosis not present

## 2015-09-30 DIAGNOSIS — Z95828 Presence of other vascular implants and grafts: Secondary | ICD-10-CM

## 2015-09-30 LAB — CBC WITH DIFFERENTIAL/PLATELET
BASO%: 0.7 % (ref 0.0–2.0)
BASOS ABS: 0 10*3/uL (ref 0.0–0.1)
EOS ABS: 0.2 10*3/uL (ref 0.0–0.5)
EOS%: 4.3 % (ref 0.0–7.0)
HEMATOCRIT: 39 % (ref 38.4–49.9)
HGB: 12.8 g/dL — ABNORMAL LOW (ref 13.0–17.1)
LYMPH#: 1.8 10*3/uL (ref 0.9–3.3)
LYMPH%: 30.2 % (ref 14.0–49.0)
MCH: 30.4 pg (ref 27.2–33.4)
MCHC: 32.7 g/dL (ref 32.0–36.0)
MCV: 93 fL (ref 79.3–98.0)
MONO#: 0.4 10*3/uL (ref 0.1–0.9)
MONO%: 7.6 % (ref 0.0–14.0)
NEUT#: 3.3 10*3/uL (ref 1.5–6.5)
NEUT%: 57.2 % (ref 39.0–75.0)
PLATELETS: 214 10*3/uL (ref 140–400)
RBC: 4.2 10*6/uL (ref 4.20–5.82)
RDW: 13.8 % (ref 11.0–14.6)
WBC: 5.9 10*3/uL (ref 4.0–10.3)

## 2015-09-30 LAB — COMPREHENSIVE METABOLIC PANEL
ALBUMIN: 3.5 g/dL (ref 3.5–5.0)
ALK PHOS: 92 U/L (ref 40–150)
ALT: 18 U/L (ref 0–55)
AST: 25 U/L (ref 5–34)
Anion Gap: 6 mEq/L (ref 3–11)
BUN: 16.4 mg/dL (ref 7.0–26.0)
CO2: 27 mEq/L (ref 22–29)
Calcium: 9.4 mg/dL (ref 8.4–10.4)
Chloride: 106 mEq/L (ref 98–109)
Creatinine: 0.9 mg/dL (ref 0.7–1.3)
EGFR: 86 mL/min/{1.73_m2} — ABNORMAL LOW (ref >90)
Glucose: 103 mg/dl (ref 70–140)
POTASSIUM: 4 meq/L (ref 3.5–5.1)
Sodium: 139 mEq/L (ref 136–145)
Total Bilirubin: 0.56 mg/dL (ref 0.20–1.20)
Total Protein: 6.7 g/dL (ref 6.4–8.3)

## 2015-09-30 MED ORDER — HEPARIN SOD (PORK) LOCK FLUSH 100 UNIT/ML IV SOLN
500.0000 [IU] | Freq: Once | INTRAVENOUS | Status: AC
  Administered 2015-09-30: 500 [IU] via INTRAVENOUS
  Filled 2015-09-30: qty 5

## 2015-09-30 MED ORDER — SODIUM CHLORIDE 0.9% FLUSH
10.0000 mL | INTRAVENOUS | Status: DC | PRN
  Administered 2015-09-30: 10 mL via INTRAVENOUS
  Filled 2015-09-30: qty 10

## 2015-10-01 ENCOUNTER — Encounter (HOSPITAL_COMMUNITY)
Admission: RE | Admit: 2015-10-01 | Discharge: 2015-10-01 | Disposition: A | Discharge status: Home / Self Care | Payer: Medicare Other | Source: Ambulatory Visit | Attending: Oncology | Admitting: Oncology

## 2015-10-01 ENCOUNTER — Ambulatory Visit (HOSPITAL_COMMUNITY)
Admission: RE | Admit: 2015-10-01 | Discharge: 2015-10-01 | Disposition: A | Discharge status: Home / Self Care | Payer: Medicare Other | Source: Ambulatory Visit | Attending: Oncology | Admitting: Oncology

## 2015-10-01 ENCOUNTER — Encounter (HOSPITAL_COMMUNITY): Payer: Self-pay

## 2015-10-01 DIAGNOSIS — C7951 Secondary malignant neoplasm of bone: Secondary | ICD-10-CM | POA: Diagnosis not present

## 2015-10-01 DIAGNOSIS — M4856XA Collapsed vertebra, not elsewhere classified, lumbar region, initial encounter for fracture: Secondary | ICD-10-CM | POA: Diagnosis not present

## 2015-10-01 DIAGNOSIS — M4855XA Collapsed vertebra, not elsewhere classified, thoracolumbar region, initial encounter for fracture: Secondary | ICD-10-CM | POA: Insufficient documentation

## 2015-10-01 DIAGNOSIS — M899 Disorder of bone, unspecified: Secondary | ICD-10-CM | POA: Diagnosis not present

## 2015-10-01 DIAGNOSIS — C61 Malignant neoplasm of prostate: Secondary | ICD-10-CM

## 2015-10-01 LAB — PSA: PROSTATE SPECIFIC AG, SERUM: 6.8 ng/mL — AB (ref 0.0–4.0)

## 2015-10-01 MED ORDER — IOPAMIDOL (ISOVUE-300) INJECTION 61%
100.0000 mL | Freq: Once | INTRAVENOUS | Status: AC | PRN
  Administered 2015-10-01: 80 mL via INTRAVENOUS

## 2015-10-01 MED ORDER — TECHNETIUM TC 99M MEDRONATE IV KIT
27.3000 | PACK | Freq: Once | INTRAVENOUS | Status: AC | PRN
Start: 2015-10-01 — End: 2015-10-01
  Administered 2015-10-01: 27.3 via INTRAVENOUS

## 2015-10-02 ENCOUNTER — Telehealth: Payer: Self-pay | Admitting: Oncology

## 2015-10-02 ENCOUNTER — Ambulatory Visit (HOSPITAL_BASED_OUTPATIENT_CLINIC_OR_DEPARTMENT_OTHER): Payer: Medicare Other | Admitting: Oncology

## 2015-10-02 VITALS — BP 118/77 | HR 84 | Temp 98.1°F | Resp 17 | Ht 66.0 in | Wt 141.6 lb

## 2015-10-02 DIAGNOSIS — E291 Testicular hypofunction: Secondary | ICD-10-CM

## 2015-10-02 DIAGNOSIS — C7951 Secondary malignant neoplasm of bone: Secondary | ICD-10-CM | POA: Diagnosis not present

## 2015-10-02 DIAGNOSIS — M898X9 Other specified disorders of bone, unspecified site: Secondary | ICD-10-CM | POA: Diagnosis not present

## 2015-10-02 DIAGNOSIS — C61 Malignant neoplasm of prostate: Secondary | ICD-10-CM

## 2015-10-02 MED ORDER — ABIRATERONE ACETATE 250 MG PO TABS: 1000.0000 mg | ORAL_TABLET | Freq: Every day | ORAL | Status: DC

## 2015-10-02 NOTE — Telephone Encounter (Signed)
Gave and printed appt sched and avs for pt for July  °

## 2015-10-02 NOTE — Progress Notes (Signed)
Hematology and Oncology Follow Up Visit  [name] [name] [id number] 03-Feb-1941 75 y.o. 10/02/2015 3:51 PM   Principle Diagnosis: 75 year old gentleman with castration resistant metastatic prostate cancer with disease to the bone. His initial diagnosis was in 2012 where he presented with metastatic disease to the rib. His PSA was 187.   Prior Therapy:  He was found to have a mass in the right axilla near the fourth rib encasing the right with some sclerosis. He underwent a right VATS procedure and resection of the third, fourth and fifth rib and reconstruction with titanium plates under the care of Dr. Arlyce Dice on November 13 2010.  He went on to receive androgen deprivation therapy under the care of Dr. Gaynelle Arabian. His PSA nadir was to 0.1 back in May of 2013 and subsequently started to rise.  He was treated with Provenge in the fall of 2014.  After his PSA went up to 178.6 and Xtandi was started in February of 2015 and discontinued in 09/2013 due to progression of disease and poor tolerance. Taxotere chemotherapy started on 10/05/2013. He is status post 16 cycles of therapy last one given in April 2016 with a PSA reduction down to 0.44.   Current therapy: Observation and surveillance.  Interim History:  [name] [name] presents today for a followup visit with his wife and son. Since his last visit, he reports doing fairly well overall. His ability to eat have improved with healing oral surgery. His weight is improving and his appetite is reasonable. He continues to ambulate without any major difficulties and has not reported any falls or syncope.  He denied any increase in his pain level including no recent back pain or shoulder pain. He continues to have chronic neck pain which is unchanged. His quality of life has not changed dramatically. He continues to perform activities of daily living without any decline.    He does not report any headaches or blurry vision or double vision. Has not reported any  syncope or change in his mental status. He is not reporting any chest pain or shortness of breath.  Does not report any palpitation or leg edema. Does not report any cough, hemoptysis or wheezing. Does not report any frequency urgency or hesitancy. Does not report any skeletal complications. Does not report any lymphadenopathy or petechiae. Remainder of his review of systems unremarkable.  Medications: I have reviewed the patient's current medications.  Current Outpatient Prescriptions  Medication Sig Dispense Refill  . acetaminophen (TYLENOL) 325 MG tablet Take 650 mg by mouth every 6 (six) hours as needed for moderate pain.     Marland Kitchen aspirin EC 81 MG tablet Take 81 mg by mouth at bedtime.    . Calcium Citrate-Vitamin D (CITRACAL + D PO) Take 3 tablets by mouth every morning.    . cholecalciferol (VITAMIN D) 400 UNITS TABS tablet Take 1,500 Units by mouth.    . ferrous sulfate 325 (65 FE) MG tablet Take 325 mg by mouth daily with breakfast.    . ibuprofen (ADVIL,MOTRIN) 200 MG tablet Take 200 mg by mouth every 4 (four) hours as needed for moderate pain.    Marland Kitchen lidocaine-prilocaine (EMLA) cream Apply 1 application topically as needed. 30 g 2  . mirtazapine (REMERON) 15 MG tablet TAKE 1 TABLET BY MOUTH AT BEDTIME 30 tablet 1  . pantoprazole (PROTONIX) 40 MG tablet Take 1 tablet (40 mg total) by mouth 2 (two) times daily before a meal. 60 tablet 5  . prochlorperazine (COMPAZINE) 10 MG tablet  Take 1 tablet (10 mg total) by mouth every 6 (six) hours as needed for nausea or vomiting. 30 tablet 0  . simvastatin (ZOCOR) 40 MG tablet Take 40 mg by mouth daily. Reported on 08/14/2015    . abiraterone Acetate (ZYTIGA) 250 MG tablet Take 4 tablets (1,000 mg total) by mouth daily. Take on an empty stomach 1 hour before or 2 hours after a meal 120 tablet 0   No current facility-administered medications for this visit.   Facility-Administered Medications Ordered in Other Visits  Medication Dose Route Frequency  Provider Last Rate Last Dose  . sodium chloride 0.9 % injection 10 mL  10 mL Intracatheter PRN Wyatt Portela, MD   10 mL at 06/13/14 1341     Allergies:  Allergies  Allergen Reactions  . Oxycodone Nausea And Vomiting  . Penicillins Itching and Rash    Past Medical History, Surgical history, Social history, and Family History were reviewed and updated.   Physical Exam: Blood pressure 118/77, pulse 84, temperature 98.1 F (36.7 C), temperature source Oral, resp. rate 17, height 5\' 6"  (1.676 m), weight 141 lb 9.6 oz (64.229 kg), SpO2 98 %. ECOG:  1 General appearance:  Head: Normocephalic, without obvious abnormality no oral Ulcers or lesions. Neck: no adenopathy or tenderness. Lymph nodes: Cervical, supraclavicular, and axillary nodes normal. Heart:regular rate and rhythm, S1, S2 normal, no murmur, click, rub or gallop  Chest wall examination revealed Port-A-Cath site without abnormalities. Lung:chest clear, no wheezing, rales, normal symmetric air entry no dullness to percussion. Abdomin: soft, non-tender, without masses or organomegaly no rebound or guarding. EXT: Trace edema noted. Neurological: No deficits noted.   Lab Results: Lab Results  Component Value Date   WBC 5.9 09/30/2015   HGB 12.8* 09/30/2015   HCT 39.0 09/30/2015   MCV 93.0 09/30/2015   PLT 214 09/30/2015     Chemistry      Component Value Date/Time   NA 139 09/30/2015 1012   NA 140 03/15/2014 0553   K 4.0 09/30/2015 1012   K 3.5* 03/15/2014 0553   CL 107 03/15/2014 0553   CO2 27 09/30/2015 1012   CO2 23 03/15/2014 0553   BUN 16.4 09/30/2015 1012   BUN 30* 03/15/2014 0553   CREATININE 0.9 09/30/2015 1012   CREATININE 0.69 03/15/2014 0553      Component Value Date/Time   CALCIUM 9.4 09/30/2015 1012   CALCIUM 8.1* 03/15/2014 0553   ALKPHOS 92 09/30/2015 1012   ALKPHOS 82 03/15/2014 0553   AST 25 09/30/2015 1012   AST 16 03/15/2014 0553   ALT 18 09/30/2015 1012   ALT 8 03/15/2014 0553    BILITOT 0.56 09/30/2015 1012   BILITOT 0.6 03/15/2014 0553        Results for [name], [name] (MRN [id number]) as of 10/02/2015 15:33  Ref. Range 07/03/2015 08:51 08/14/2015 11:08 09/30/2015 10:12  PSA Latest Ref Range: 0.0-4.0 ng/mL 4.3 (H) 5.4 (H) 6.8 (H)   EXAM: CT CHEST, ABDOMEN, AND PELVIS WITH CONTRAST  TECHNIQUE: Multidetector CT imaging of the chest, abdomen and pelvis was performed following the standard protocol during bolus administration of intravenous contrast.  CONTRAST: 30mL ISOVUE-300 IOPAMIDOL (ISOVUE-300) INJECTION 61%  COMPARISON: PET-CT from 08/30/2013.  FINDINGS: CT CHEST FINDINGS  Mediastinum/Lymph Nodes: There is no axillary lymphadenopathy. No mediastinal lymphadenopathy. There is no hilar lymphadenopathy. The heart size is normal. No pericardial effusion. Coronary artery calcification is noted. The esophagus has normal imaging features. Right Port-A-Cath tip is positioned at the [place]  junction.  Lungs/Pleura: Centrilobular and paraseptal emphysema. Bronchial wall thickening. Areas of architectural distortion and subpleural scarring noted bilaterally. No focal airspace consolidation. No pulmonary edema or pleural effusion. The multiple bilateral pulmonary nodules seen on the previous PET-CT are not evident on CT imaging today.  Musculoskeletal: Sclerosis in the right humeral head is stable. Patient has had fixation of multiple anterior right ribs.  CT ABDOMEN PELVIS FINDINGS  Hepatobiliary: No focal abnormality within the liver parenchyma. There is no evidence for gallstones, gallbladder wall thickening, or pericholecystic fluid. No intrahepatic or extrahepatic biliary dilation.  Pancreas: No focal mass lesion. No dilatation of the main duct. No intraparenchymal cyst. No peripancreatic edema.  Spleen: No splenomegaly. No focal mass lesion.  Adrenals/Urinary Tract: No adrenal nodule or mass. Right kidney is unremarkable.  Central sinus cysts are noted in the left kidney. No evidence for hydroureter. The urinary bladder appears normal for the degree of distention.  Stomach/Bowel: Stomach is nondistended. No gastric wall thickening. No evidence of outlet obstruction. Duodenum is normally positioned as is the ligament of Treitz. No small bowel wall thickening. No small bowel dilatation. The terminal ileum is normal. Diverticular changes are noted in the left colon without evidence of diverticulitis.  Vascular/Lymphatic: There is abdominal aortic atherosclerosis without aneurysm. There is no gastrohepatic or hepatoduodenal ligament lymphadenopathy. No intraperitoneal or retroperitoneal lymphadenopathy. No pelvic sidewall lymphadenopathy.  Reproductive: Prostate gland not well seen.  Other: No intraperitoneal free fluid.  Musculoskeletal: Stable sclerotic lesion involving the right inferior pubic ramus. Ill-defined lesion in the left iliac crest appears slightly progressed in the interval. There is a new lesion in the right iliac bone, adjacent to the SI joint, measuring 3.4 cm. Posterior left iliac lesion again noted. Anterior wedge compression deformity is seen at T12. There is compression fracture of L2.  IMPRESSION: 1. Interval resolution of multiple bilateral pulmonary nodules seen on previous study of 08/30/2013. 2. Multiple scattered sclerotic bone lesions. Many of these appear stable but there is a new lesion in the right iliac bone. 3. No evidence for soft tissue metastases in the chest, abdomen, or pelvis. 4. Compression fractures at T12 and L2.  EXAM: NUCLEAR MEDICINE WHOLE BODY BONE SCAN  TECHNIQUE: Whole body anterior and posterior images were obtained approximately 3 hours after intravenous injection of radiopharmaceutical.  RADIOPHARMACEUTICALS: 27.3 mCi Technetium-86m MDP IV  COMPARISON: Current chest, abdomen and pelvis CT scan. Bone  scan, 11/19/2010.  FINDINGS: There is abnormal radiotracer localization in multiple locations. There is abnormal uptake in the proximal right humerus, involving multiple loops, along thoracolumbar spine, in the pelvis particularly notable along the right ischium, and in right proximal femur. These areas uptake are less intense than they were on the prior bone scan, but show a similar distribution.  Uptake in the spine may be from progression of degenerative change as well as related to compression fractures. There is a moderate compression fracture of T12 and at L1 with a mild compression fracture of L4. A lateral chest CT from 08/04/2011 discovered the T12 and L1 levels showing no evidence of a fracture.  There are other areas of uptake involving multiple joints that appear degenerative in origin, similar to the prior bone scan.  IMPRESSION: 1. Metastatic disease to bone, similar in distribution to the prior bone scan, but with less intense activity. New uptake in the thoracolumbar spine may be degenerative and related to interval vertebral compression fractures. A component of new metastatic disease is possible. There is no other evidence of new metastatic  disease.    Impression and Plan:  75 year old gentleman with the following issues:  1. 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 After progressing on Xtandi, he received systemic chemotherapy in the form of Taxotere between June 2015 till April 2016 for a total of 16 cycles. His PSA dropped down to 0.44 from 176.  His most recent PSA was up to 6.8 with a doubling time of close to 3 months. CT scan and bone scan obtained on 10/01/2015 were personally reviewed and discussed with the patient and his family. He appears to have no radiographic progression at this time. He does have bony metastasis which have not changed compared to previous studies.  Options of therapy were  reviewed today including continued observation surveillance, resumption of systemic chemotherapy or using Zytiga. The rationale for all of these options were reviewed today and I feel it is reasonable to start Zytiga at this time. Risks and benefits of this medication were reviewed. Complications include hypertension, edema, electrolyte imbalance among others were discussed. The role of prednisone was also reviewed and will be admitted for better tolerance. I anticipate starting him about 1000 mg daily on an empty stomach. If he tolerates this medication poorly, dose reduction and discontinuation would be an option.  He is agreeable to proceed in the near future.    2. Bone pain: arthritic in nature without any recent changes. There is no evidence to suggest progression of disease to justify any increased bone pain.  3. Dental issues: Will continue to hold off Xgeva at this time based on his request.  4. IV access: Status post right anterior chest Port-A-Cath placement. This will be flushed every 8 weeks.   5. Androgen depravation: To be continued indefinitely. He reports no complications related to that.  6. Followup: Will be in 4 weeks to review his tolerance to this medication.   [facility], MD  6/14/20173:51 PM

## 2015-10-03 ENCOUNTER — Encounter: Payer: Self-pay | Admitting: Pharmacist

## 2015-10-03 ENCOUNTER — Telehealth: Payer: Self-pay | Admitting: Pharmacist

## 2015-10-03 ENCOUNTER — Other Ambulatory Visit: Payer: Self-pay | Admitting: *Deleted

## 2015-10-03 DIAGNOSIS — C7951 Secondary malignant neoplasm of bone: Secondary | ICD-10-CM

## 2015-10-03 DIAGNOSIS — C61 Malignant neoplasm of prostate: Secondary | ICD-10-CM

## 2015-10-03 MED ORDER — ABIRATERONE ACETATE 250 MG PO TABS: 1000.0000 mg | ORAL_TABLET | Freq: Every day | ORAL | Status: DC

## 2015-10-03 MED FILL — ZYTIGA 250 MG TABLET: 250 | 30 days supply | Qty: 120 | Fill #0

## 2015-10-03 NOTE — Telephone Encounter (Signed)
Oral Chemotherapy Pharmacist Encounter  I spoke with patients wife, [name], for overview of new oral chemotherapy medication: Zytiga. Planned start dose : 1000 mg daily on empty stomach. Planned start date : 10/04/15  The prescriptions have been sent to the [facility] and copay is $30.  They will p/u this evening. Counseled patients wife on administration, dosing, side effects, safe handling, and monitoring. Side effects include but not limited to: fatigue, hypertension, swelling/edema.    Pt is no longer on Remeron.  This has potential to interact w/ Zytiga.  Pt only used this while on chemo in past for appetite stimulation.  All questions answered.  Pts wife stated pt usually has low BP but they will monitor at home daily and record BP.  There is likelihood that pt will also start on Prednisone per Dr. Alen Blew (RX pending).  Pts wife aware.  Will follow up with patients wife in 1 week for pts adherence and toxicity management.   Thank you, Kennith Center, Pharm.D., CPP 10/03/2015@2 :39 PM Oral Chemotherapy [facility]

## 2015-10-03 NOTE — Progress Notes (Signed)
PA received from [place].  Rx for Prednisone to be used w/ Fabio Asa is pending.  Dr. Alen Blew aware & will add. PA authorization faxed back to [facility] OP Rx. Kennith Center, Pharm.D., CPP 10/03/2015@10 :39 AM Oral Chemo Clinic

## 2015-10-10 ENCOUNTER — Telehealth: Payer: Self-pay | Admitting: Pharmacist

## 2015-10-10 NOTE — Telephone Encounter (Signed)
Left vm for wife, [name] on her mobile number.  Checking in on Zytiga start. Will try again tomorrow. Kennith Center, Pharm.D., CPP 10/10/2015@3 :[facility]

## 2015-10-11 ENCOUNTER — Encounter: Payer: Self-pay | Admitting: Pharmacist

## 2015-10-11 NOTE — Progress Notes (Signed)
Oral Chemotherapy Follow-Up Form  Original Start date of oral chemotherapy: 10-04-15  Called patient's wife, [name] [name], today to follow up regarding patient's oral chemotherapy medication: Zytiga (1000mg  daily on empty stomach)  Pt is doing well on Zytiga, wife reports no side effects, no swelling, and his BP is stable which they are monitoring at home. She reports his appetite is good and hasn't changed since starting Zytiga.  Pt reports 0 tablets/doses missed in the last week. His wife administers the medication to him and uses an alarm on her phone as a reminder.  Pt reports the following side effects: none  Will follow up and call patient's wife again in ~1 month.  Thank you,  Skip Mayer, PharmD, BCPS Oral Chemotherapy Clinic

## 2015-10-15 ENCOUNTER — Telehealth: Payer: Self-pay

## 2015-10-15 NOTE — Telephone Encounter (Signed)
Received a call from [facility]

## 2015-10-16 ENCOUNTER — Telehealth: Payer: Self-pay | Admitting: Pharmacist

## 2015-10-16 NOTE — Telephone Encounter (Signed)
Pts wife, [name] left a vm on Oral Chemo Clinic phone reporting her husband's BP = 143/80 yesterday (10/15/15). Note prior to starting Temodar, BP = 118/77. Will forward to Dr. Ignacia Palma. Kennith Center, Pharm.D., CPP 10/16/2015@9 :43 AM Oral Chemo Clinic

## 2015-10-21 ENCOUNTER — Telehealth: Payer: Self-pay | Admitting: Pharmacist

## 2015-10-21 NOTE — Telephone Encounter (Signed)
Error: Pt is on ZYTIGA (not Temodar, as noted in earlier note).  Kennith Center, Pharm.D., CPP  10/21/2015@8 :27 AM

## 2015-10-21 NOTE — Telephone Encounter (Signed)
Pts wife, [name] called [facility] to report BP's, pt on Zytiga. [name] stated pts baseline BP usually ran 90/50's and that now they run "all over": 92/55 - on start date of Zytiga (10/04/15) Since last call on 10/16/15, BP's: 129/78, 110/68, 145/86, 119/64, yesterday (10/20/15) = 135/76 and today (10/21/15) = 154/88 Pts BP appears to be higher in the AM. Pt takes Zytiga daily at 7:30 am.  He is currently not taking any anti-hypertension meds.  Per Micromedex, coadministration of a corticosteroid may help to reduce incidence and severity of hypertension.  Pt is not currently on Prednisone.  Dr. Alen Blew planned to consider in the future.  Did not initiate w/ Zytiga due to tolerance concerns.  [name] reports her husband has slight puffiness in legs/feet but no more than usual. Denies dizziness.  No other issues to report.  Other than BP, wife "can't even tell he is on anything.  He is normal." I will forward to Dr. Alen Blew (on PAL this week), today's on-call MD (Dr. Burr Medico), and Dr. Hazeline Junker RN. Pts wife is aware Dr. Alen Blew is off.  She understands that someone from our office will call her if there is need for concern or intervention.  Pt has lab/MD appt w/ Dr. Alen Blew on 11/01/15.  Kennith Center, Pharm.D., CPP 10/21/2015@8 :34 AM [facility]

## 2015-10-21 NOTE — Telephone Encounter (Signed)
Error:  Pt is on ZYTIGA (not Temodar, as noted in earlier note). Kennith Center, Pharm.D., CPP 10/21/2015@8 :27 AM

## 2015-10-21 NOTE — Telephone Encounter (Signed)
Error

## 2015-10-21 NOTE — Telephone Encounter (Signed)
Please let pt monitor his BP daily at home, will consider antihypertension meds if BP persistently high (>150/90) or symptomatic.   Truitt Merle  10/21/2015

## 2015-11-01 ENCOUNTER — Other Ambulatory Visit: Payer: Self-pay | Admitting: Oncology

## 2015-11-01 ENCOUNTER — Ambulatory Visit (HOSPITAL_BASED_OUTPATIENT_CLINIC_OR_DEPARTMENT_OTHER): Payer: Medicare Other | Admitting: Oncology

## 2015-11-01 ENCOUNTER — Telehealth: Payer: Self-pay | Admitting: Oncology

## 2015-11-01 ENCOUNTER — Other Ambulatory Visit (HOSPITAL_BASED_OUTPATIENT_CLINIC_OR_DEPARTMENT_OTHER): Payer: Medicare Other

## 2015-11-01 VITALS — BP 129/72 | HR 76 | Temp 98.0°F | Resp 18 | Ht 66.0 in | Wt 143.1 lb

## 2015-11-01 DIAGNOSIS — C61 Malignant neoplasm of prostate: Secondary | ICD-10-CM

## 2015-11-01 DIAGNOSIS — C7951 Secondary malignant neoplasm of bone: Secondary | ICD-10-CM

## 2015-11-01 LAB — COMPREHENSIVE METABOLIC PANEL
ALK PHOS: 112 U/L (ref 40–150)
ALT: 12 U/L (ref 0–55)
ANION GAP: 9 meq/L (ref 3–11)
AST: 22 U/L (ref 5–34)
Albumin: 3.3 g/dL — ABNORMAL LOW (ref 3.5–5.0)
BILIRUBIN TOTAL: 0.5 mg/dL (ref 0.20–1.20)
BUN: 13 mg/dL (ref 7.0–26.0)
CHLORIDE: 105 meq/L (ref 98–109)
CO2: 24 meq/L (ref 22–29)
CREATININE: 0.8 mg/dL (ref 0.7–1.3)
Calcium: 8.9 mg/dL (ref 8.4–10.4)
EGFR: 87 mL/min/{1.73_m2} — AB (ref >90)
Glucose: 92 mg/dl (ref 70–140)
POTASSIUM: 3.5 meq/L (ref 3.5–5.1)
Sodium: 138 mEq/L (ref 136–145)
TOTAL PROTEIN: 6.3 g/dL — AB (ref 6.4–8.3)

## 2015-11-01 LAB — CBC WITH DIFFERENTIAL/PLATELET
BASO%: 1.4 % (ref 0.0–2.0)
BASOS ABS: 0.1 10*3/uL (ref 0.0–0.1)
EOS ABS: 0.3 10*3/uL (ref 0.0–0.5)
EOS%: 5.3 % (ref 0.0–7.0)
HCT: 39 % (ref 38.4–49.9)
HGB: 12.8 g/dL — ABNORMAL LOW (ref 13.0–17.1)
LYMPH%: 34.9 % (ref 14.0–49.0)
MCH: 30.3 pg (ref 27.2–33.4)
MCHC: 32.7 g/dL (ref 32.0–36.0)
MCV: 92.7 fL (ref 79.3–98.0)
MONO#: 0.6 10*3/uL (ref 0.1–0.9)
MONO%: 9.1 % (ref 0.0–14.0)
NEUT#: 3.1 10*3/uL (ref 1.5–6.5)
NEUT%: 49.3 % (ref 39.0–75.0)
PLATELETS: 206 10*3/uL (ref 140–400)
RBC: 4.21 10*6/uL (ref 4.20–5.82)
RDW: 13.3 % (ref 11.0–14.6)
WBC: 6.2 10*3/uL (ref 4.0–10.3)
lymph#: 2.2 10*3/uL (ref 0.9–3.3)

## 2015-11-01 MED FILL — ZYTIGA 250 MG TABLET: 250 | 30 days supply | Qty: 120 | Fill #0

## 2015-11-01 NOTE — Progress Notes (Signed)
Hematology and Oncology Follow Up Visit  Seraj Bourgoin [id number] 04/23/1940 75 y.o. 11/01/2015 2:40 PM   Principle Diagnosis: 75 year old gentleman with castration resistant metastatic prostate cancer with disease to the bone. His initial diagnosis was in 2012 where he presented with metastatic disease to the rib. His PSA was 187.   Prior Therapy:  He was found to have a mass in the right axilla near the fourth rib encasing the right with some sclerosis. He underwent a right VATS procedure and resection of the third, fourth and fifth rib and reconstruction with titanium plates under the care of Dr. Arlyce Dice on November 13 2010.  He went on to receive androgen deprivation therapy under the care of Dr. Gaynelle Arabian. His PSA nadir was to 0.1 back in May of 2013 and subsequently started to rise.  He was treated with Provenge in the fall of 2014.  After his PSA went up to 178.6 and Xtandi was started in February of 2015 and discontinued in 09/2013 due to progression of disease and poor tolerance. Taxotere chemotherapy started on 10/05/2013. He is status post 16 cycles of therapy last one given in April 2016 with a PSA reduction down to 0.44.   Current therapy: Zytiga 1000 mg daily started in June 2017.  Interim History:  Mr. Pavlovich presents today for a followup visit with his wife and son. Since his last visit, he started Zytiga and tolerated it well. He reports no complications at this time including no lower extremity edema or fatigue. His appetite remained excellent and gained 1 pound. His blood pressure was noted to be elevated at occasions but his ambulatory blood pressure monitoring for the most part showed systolic blood pressure in the 120s to 130s.  He denied any increase in his pain level including no recent back pain or shoulder pain. He continues to have chronic neck pain which is unchanged. His quality of life has not changed dramatically. He reports no decline in his quality of life since  the start of Zytiga.    He does not report any headaches or blurry vision or double vision. Has not reported any syncope or change in his mental status. He is not reporting any chest pain or shortness of breath.  Does not report any palpitation or leg edema. Does not report any cough, hemoptysis or wheezing. Does not report any frequency urgency or hesitancy. Does not report any skeletal complications. Does not report any lymphadenopathy or petechiae. Remainder of his review of systems unremarkable.  Medications: I have reviewed the patient's current medications.  Current Outpatient Prescriptions  Medication Sig Dispense Refill  . acetaminophen (TYLENOL) 325 MG tablet Take 650 mg by mouth every 6 (six) hours as needed for moderate pain.     Marland Kitchen aspirin EC 81 MG tablet Take 81 mg by mouth at bedtime.    . Calcium Citrate-Vitamin D (CITRACAL + D PO) Take 3 tablets by mouth every morning.    . cholecalciferol (VITAMIN D) 400 UNITS TABS tablet Take 1,500 Units by mouth.    . ferrous sulfate 325 (65 FE) MG tablet Take 325 mg by mouth daily with breakfast.    . ibuprofen (ADVIL,MOTRIN) 200 MG tablet Take 200 mg by mouth every 4 (four) hours as needed for moderate pain.    Marland Kitchen lidocaine-prilocaine (EMLA) cream Apply 1 application topically as needed. 30 g 2  . pantoprazole (PROTONIX) 40 MG tablet Take 1 tablet (40 mg total) by mouth 2 (two) times daily before a meal. 60 tablet  5  . prochlorperazine (COMPAZINE) 10 MG tablet Take 1 tablet (10 mg total) by mouth every 6 (six) hours as needed for nausea or vomiting. 30 tablet 0  . simvastatin (ZOCOR) 40 MG tablet Take 40 mg by mouth daily. Reported on 08/14/2015    . ZYTIGA 250 MG tablet TAKE 4 TABLETS BY MOUTH DAILY. TAKE ON AN EMPTY STOMACH 1 HOUR BEFORE OR 2 HOURS AFTER A MEAL 120 tablet 0   No current facility-administered medications for this visit.   Facility-Administered Medications Ordered in Other Visits  Medication Dose Route Frequency Provider Last  Rate Last Dose  . sodium chloride 0.9 % injection 10 mL  10 mL Intracatheter PRN Wyatt Portela, MD   10 mL at 06/13/14 1341     Allergies:  Allergies  Allergen Reactions  . Oxycodone Nausea And Vomiting  . Penicillins Itching and Rash    Past Medical History, Surgical history, Social history, and Family History were reviewed and updated.   Physical Exam: Blood pressure 129/72, pulse 76, temperature 98 F (36.7 C), temperature source Oral, resp. rate 18, height 5\' 6"  (1.676 m), weight 143 lb 1.6 oz (64.91 kg), SpO2 99 %. ECOG:  1 General appearance: Pleasant-appearing gentleman without distress. Head: Normocephalic, without obvious abnormality no oral thrush noted. Neck: no adenopathy or tenderness. Lymph nodes: Cervical, supraclavicular, and axillary nodes normal. Heart:regular rate and rhythm, S1, S2 normal, no murmur, click, rub or gallop  Chest wall examination revealed Port-A-Cath site without abnormalities. Lung:chest clear, no wheezing, rales, normal symmetric air entry.  Abdomin: soft, non-tender, without masses or organomegaly no shifting dullness or ascites. EXT: Trace edema noted. Neurological: No deficits noted.   Lab Results: Lab Results  Component Value Date   WBC 6.2 11/01/2015   HGB 12.8* 11/01/2015   HCT 39.0 11/01/2015   MCV 92.7 11/01/2015   PLT 206 11/01/2015     Chemistry      Component Value Date/Time   NA 139 09/30/2015 1012   NA 140 03/15/2014 0553   K 4.0 09/30/2015 1012   K 3.5* 03/15/2014 0553   CL 107 03/15/2014 0553   CO2 27 09/30/2015 1012   CO2 23 03/15/2014 0553   BUN 16.4 09/30/2015 1012   BUN 30* 03/15/2014 0553   CREATININE 0.9 09/30/2015 1012   CREATININE 0.69 03/15/2014 0553      Component Value Date/Time   CALCIUM 9.4 09/30/2015 1012   CALCIUM 8.1* 03/15/2014 0553   ALKPHOS 92 09/30/2015 1012   ALKPHOS 82 03/15/2014 0553   AST 25 09/30/2015 1012   AST 16 03/15/2014 0553   ALT 18 09/30/2015 1012   ALT 8 03/15/2014  0553   BILITOT 0.56 09/30/2015 1012   BILITOT 0.6 03/15/2014 0553        Impression and Plan:  75 year old gentleman with the following issues:  1. 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 After progressing on Xtandi, he received systemic chemotherapy in the form of Taxotere between June 2015 till April 2016 for a total of 16 cycles. His PSA dropped down to 0.44 from 176.  His PSA increased up to 6.8 in June 2017 with bony metastasis that have been relatively stable. He started on Zytiga in June 2017 and tolerated it well. He has no major complications that required dose reduction or delay. His PSA is currently pending and plan to monitored on a monthly basis.    2. Bone pain: arthritic in nature without any recent changes. No bone  pain reported since the last visit.  3. Dental issues: Will continue to hold off Xgeva at this time based on his request.  4. IV access: Status post right anterior chest Port-A-Cath placement. This will be flushed every 8 weeks. This will be due in August 2017.   5. Androgen depravation: To be continued indefinitely. He reports no complications related to that.  6. Hypertension: His blood pressure today and appears within normal range. His laboratory blood pressure monitoring have indicated for the most part normal range as well.  7. Followup: Will be in 4 weeks to review his tolerance to this medication.   Zola Button, MD  7/14/20172:40 PM

## 2015-11-01 NOTE — Telephone Encounter (Signed)
GAVE PATIENT DTR AVS REPORT AND APPOINTMENTS FOR AUGUST (12/04/15 @ 3:15PM)

## 2015-11-02 LAB — PSA: Prostate Specific Ag, Serum: 2.9 ng/mL (ref 0.0–4.0)

## 2015-11-04 ENCOUNTER — Telehealth: Payer: Self-pay | Admitting: *Deleted

## 2015-11-04 NOTE — Telephone Encounter (Signed)
Spoke with wife ellen, gave results of last PSA.

## 2015-11-04 NOTE — Telephone Encounter (Signed)
-----   Message from Wyatt Portela, MD sent at 11/04/2015  8:39 AM EDT ----- Please let him know his PSA is down.

## 2015-12-02 ENCOUNTER — Other Ambulatory Visit: Payer: Self-pay | Admitting: Oncology

## 2015-12-02 MED FILL — ZYTIGA 250 MG TABLET: 250 | 30 days supply | Qty: 120 | Fill #0

## 2015-12-04 ENCOUNTER — Other Ambulatory Visit: Payer: Self-pay | Admitting: *Deleted

## 2015-12-04 ENCOUNTER — Other Ambulatory Visit (HOSPITAL_BASED_OUTPATIENT_CLINIC_OR_DEPARTMENT_OTHER): Payer: Medicare Other

## 2015-12-04 ENCOUNTER — Ambulatory Visit (HOSPITAL_BASED_OUTPATIENT_CLINIC_OR_DEPARTMENT_OTHER): Payer: Medicare Other

## 2015-12-04 ENCOUNTER — Ambulatory Visit (HOSPITAL_BASED_OUTPATIENT_CLINIC_OR_DEPARTMENT_OTHER): Payer: Medicare Other | Admitting: Oncology

## 2015-12-04 ENCOUNTER — Ambulatory Visit: Payer: Medicare Other

## 2015-12-04 ENCOUNTER — Telehealth: Payer: Self-pay | Admitting: Oncology

## 2015-12-04 VITALS — BP 142/74 | HR 87 | Temp 97.8°F | Resp 16 | Ht 66.0 in | Wt 138.6 lb

## 2015-12-04 DIAGNOSIS — C61 Malignant neoplasm of prostate: Secondary | ICD-10-CM | POA: Diagnosis not present

## 2015-12-04 DIAGNOSIS — E291 Testicular hypofunction: Secondary | ICD-10-CM

## 2015-12-04 DIAGNOSIS — I10 Essential (primary) hypertension: Secondary | ICD-10-CM

## 2015-12-04 DIAGNOSIS — M898X9 Other specified disorders of bone, unspecified site: Secondary | ICD-10-CM | POA: Diagnosis not present

## 2015-12-04 DIAGNOSIS — C7951 Secondary malignant neoplasm of bone: Secondary | ICD-10-CM

## 2015-12-04 DIAGNOSIS — Z95828 Presence of other vascular implants and grafts: Secondary | ICD-10-CM

## 2015-12-04 LAB — COMPREHENSIVE METABOLIC PANEL
ALK PHOS: 106 U/L (ref 40–150)
ALT: 12 U/L (ref 0–55)
AST: 23 U/L (ref 5–34)
Albumin: 3.3 g/dL — ABNORMAL LOW (ref 3.5–5.0)
Anion Gap: 7 mEq/L (ref 3–11)
BILIRUBIN TOTAL: 0.8 mg/dL (ref 0.20–1.20)
BUN: 11.4 mg/dL (ref 7.0–26.0)
CHLORIDE: 103 meq/L (ref 98–109)
CO2: 28 meq/L (ref 22–29)
Calcium: 9.3 mg/dL (ref 8.4–10.4)
Creatinine: 0.8 mg/dL (ref 0.7–1.3)
EGFR: 87 mL/min/{1.73_m2} — AB (ref >90)
Glucose: 143 mg/dl — ABNORMAL HIGH (ref 70–140)
Potassium: 2.8 mEq/L — CL (ref 3.5–5.1)
SODIUM: 138 meq/L (ref 136–145)
TOTAL PROTEIN: 6.5 g/dL (ref 6.4–8.3)

## 2015-12-04 LAB — CBC WITH DIFFERENTIAL/PLATELET
BASO%: 0.9 % (ref 0.0–2.0)
Basophils Absolute: 0.1 10*3/uL (ref 0.0–0.1)
EOS%: 4.5 % (ref 0.0–7.0)
Eosinophils Absolute: 0.3 10*3/uL (ref 0.0–0.5)
HCT: 39.1 % (ref 38.4–49.9)
HGB: 12.8 g/dL — ABNORMAL LOW (ref 13.0–17.1)
LYMPH%: 30.3 % (ref 14.0–49.0)
MCH: 30.1 pg (ref 27.2–33.4)
MCHC: 32.7 g/dL (ref 32.0–36.0)
MCV: 92.1 fL (ref 79.3–98.0)
MONO#: 0.6 10*3/uL (ref 0.1–0.9)
MONO%: 8.1 % (ref 0.0–14.0)
NEUT%: 56.2 % (ref 39.0–75.0)
NEUTROS ABS: 3.9 10*3/uL (ref 1.5–6.5)
Platelets: 216 10*3/uL (ref 140–400)
RBC: 4.25 10*6/uL (ref 4.20–5.82)
RDW: 13.6 % (ref 11.0–14.6)
WBC: 6.9 10*3/uL (ref 4.0–10.3)
lymph#: 2.1 10*3/uL (ref 0.9–3.3)

## 2015-12-04 MED ORDER — SODIUM CHLORIDE 0.9 % IJ SOLN
10.0000 mL | INTRAMUSCULAR | Status: DC | PRN
  Administered 2015-12-04: 10 mL via INTRAVENOUS
  Filled 2015-12-04: qty 10

## 2015-12-04 MED ORDER — HEPARIN SOD (PORK) LOCK FLUSH 100 UNIT/ML IV SOLN
500.0000 [IU] | Freq: Once | INTRAVENOUS | Status: AC | PRN
  Administered 2015-12-04: 500 [IU] via INTRAVENOUS
  Filled 2015-12-04: qty 5

## 2015-12-04 MED ORDER — POTASSIUM CHLORIDE CRYS ER 20 MEQ PO TBCR: 20.0000 meq | EXTENDED_RELEASE_TABLET | Freq: Every day | ORAL | 0 refills | Status: DC

## 2015-12-04 NOTE — Progress Notes (Signed)
Hematology and Oncology Follow Up Visit  Zaydyn Harlin [id number] 1940/10/20 75 y.o. 12/04/2015 3:32 PM   Principle Diagnosis: 75 year old man with castration resistant metastatic prostate cancer with disease to the bone. His initial diagnosis was in 2012 where he presented with metastatic disease to the rib. His PSA was 187.   Prior Therapy:  He was found to have a mass in the right axilla near the fourth rib encasing the right with some sclerosis. He underwent a right VATS procedure and resection of the third, fourth and fifth rib and reconstruction with titanium plates under the care of Dr. Arlyce Dice on November 13 2010.  He went on to receive androgen deprivation therapy under the care of Dr. Gaynelle Arabian. His PSA nadir was to 0.1 back in May of 2013 and subsequently started to rise.  He was treated with Provenge in the fall of 2014.  After his PSA went up to 178.6 and Xtandi was started in February of 2015 and discontinued in 09/2013 due to progression of disease and poor tolerance. Taxotere chemotherapy started on 10/05/2013. He is status post 16 cycles of therapy last one given in April 2016 with a PSA reduction down to 0.44.   Current therapy: Zytiga 1000 mg daily started in June 2017.  Interim History:  Mr. Crismon presents today for a followup visit with his wife and son. Since his last visit, he reports no major changes in his health. He remains in good shape with good quality of life.He reports no complications related to Zytiga including no lower extremity edema or fatigue. His appetite did declining some and lost a few pounds since last visit. He does have lower extremity edema which is chronic in nature and have not changed. His blood pressure remain within normal range.  He continues to report chronic neck pain which have not changed. He denies any new pain issues or arthralgias. His performance status and activity level remains but same.    He does not report any headaches or blurry  vision or double vision. Has not reported any syncope or change in his mental status. He is not reporting any chest pain or shortness of breath.  Does not report any palpitation or leg edema. Does not report any cough, hemoptysis or wheezing. Does not report any frequency urgency or hesitancy. Does not report any skeletal complications. Does not report any lymphadenopathy or petechiae. Remainder of his review of systems unremarkable.  Medications: I have reviewed the patient's current medications.  Current Outpatient Prescriptions  Medication Sig Dispense Refill  . acetaminophen (TYLENOL) 325 MG tablet Take 650 mg by mouth every 6 (six) hours as needed for moderate pain.     Marland Kitchen aspirin EC 81 MG tablet Take 81 mg by mouth at bedtime.    . Calcium Citrate-Vitamin D (CITRACAL + D PO) Take 3 tablets by mouth every morning.    . cholecalciferol (VITAMIN D) 400 UNITS TABS tablet Take 1,500 Units by mouth.    . ferrous sulfate 325 (65 FE) MG tablet Take 325 mg by mouth daily with breakfast.    . ibuprofen (ADVIL,MOTRIN) 200 MG tablet Take 200 mg by mouth every 4 (four) hours as needed for moderate pain.    Marland Kitchen lidocaine-prilocaine (EMLA) cream Apply 1 application topically as needed. 30 g 2  . pantoprazole (PROTONIX) 40 MG tablet Take 1 tablet (40 mg total) by mouth 2 (two) times daily before a meal. 60 tablet 5  . prochlorperazine (COMPAZINE) 10 MG tablet Take 1 tablet (10  mg total) by mouth every 6 (six) hours as needed for nausea or vomiting. 30 tablet 0  . simvastatin (ZOCOR) 40 MG tablet Take 40 mg by mouth daily. Reported on 08/14/2015    . ZYTIGA 250 MG tablet TAKE 4 TABLETS BY MOUTH DAILY. TAKE ON AN EMPTY STOMACH 1 HOUR BEFORE OR 2 HOURS AFTER A MEAL 120 tablet 0   No current facility-administered medications for this visit.      Allergies:  Allergies  Allergen Reactions  . Oxycodone Nausea And Vomiting  . Penicillins Itching and Rash    Past Medical History, Surgical history, Social  history, and Family History were reviewed and updated.   Physical Exam: Blood pressure (!) 142/74, pulse 87, temperature 97.8 F (36.6 C), temperature source Oral, resp. rate 16, height 5\' 6"  (1.676 m), weight 138 lb 9.6 oz (62.9 kg), SpO2 99 %. ECOG:  1 General appearance: Alert, awake gentleman appeared without distress. Head: Normocephalic, without obvious abnormality no oral ulcers or lesions. Neck: no adenopathy or tenderness. Lymph nodes: Cervical, supraclavicular, and axillary nodes normal. Heart:regular rate and rhythm, S1, S2 normal, no murmur, click, rub or gallop  Chest wall examination revealed Port-A-Cath site without erythema or induration. Lung:chest clear, no wheezing, rales, normal symmetric air entry.  Abdomin: soft, non-tender, without masses or organomegaly no rebound or guarding. EXT: Trace edema noted. Neurological: No deficits noted.   Lab Results: Lab Results  Component Value Date   WBC 6.9 12/04/2015   HGB 12.8 (L) 12/04/2015   HCT 39.1 12/04/2015   MCV 92.1 12/04/2015   PLT 216 12/04/2015     Chemistry      Component Value Date/Time   NA 138 11/01/2015 1410   K 3.5 11/01/2015 1410   CL 107 03/15/2014 0553   CO2 24 11/01/2015 1410   BUN 13.0 11/01/2015 1410   CREATININE 0.8 11/01/2015 1410      Component Value Date/Time   CALCIUM 8.9 11/01/2015 1410   ALKPHOS 112 11/01/2015 1410   AST 22 11/01/2015 1410   ALT 12 11/01/2015 1410   BILITOT 0.50 11/01/2015 1410      Results for ANSON, MIRACLE (MRN [id number]) as of 12/04/2015 15:21  Ref. Range 09/30/2015 10:12 11/01/2015 14:10  PSA Latest Ref Range: 0.0 - 4.0 ng/mL 6.8 (H) 2.9    Impression and Plan:  75 year old gentleman with the following issues:  1. 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 After progressing on Xtandi, he received systemic chemotherapy in the form of Taxotere between June 2015 till April 2016 for a total of 16 cycles. His PSA  dropped down to 0.44 from 176.  His PSA increased up to 6.8 in June 2017 with bony metastasis that have been relatively stable. He started on Zytiga in June 2017 without any new complications at this time. His PSA had an excellent response with a drop 1 than 50% after one month. The plan is to continue the same dose and schedule without any dose reduction or delay.    2. Bone pain: arthritic in nature without any recent changes. Pain quality has not changed since the last visit.  3. Dental issues: He has been off because of dental related issues. We'll continue to hold this medication.  4. IV access: Status post right anterior chest Port-A-Cath placement. This will be flushed every 8 weeks. This will be flushed again in October 2017.  5. Androgen depravation: We will check his testosterone was future visits and restart androgen  deprivation therapy if his testosterone status to rise.  6. Hypertension: His blood pressure today and appears within normal range.   7. Followup: Will be in 4 weeks to review his tolerance to this medication.   Zola Button, MD  8/16/20173:32 PM

## 2015-12-04 NOTE — Telephone Encounter (Signed)
Spoke with wife let her know potassium was low and per dr Alen Blew, k-dur #30 called to [facility]. Take 20 meq daily. Wife verbalized understanding.

## 2015-12-04 NOTE — Patient Instructions (Signed)

## 2015-12-04 NOTE — Telephone Encounter (Signed)
GAVE RELATIVE AVS REPORT AND APPOINTMENTS FOR September  °

## 2015-12-05 ENCOUNTER — Telehealth: Payer: Self-pay | Admitting: *Deleted

## 2015-12-05 LAB — PSA: Prostate Specific Ag, Serum: 2.4 ng/mL (ref 0.0–4.0)

## 2015-12-05 NOTE — Telephone Encounter (Signed)
-----   Message from Wyatt Portela, MD sent at 12/05/2015 10:44 AM EDT ----- Please let him know his PSA down again to 2.4

## 2015-12-05 NOTE — Telephone Encounter (Signed)
Spoke with wife, gave results of last PSA 

## 2015-12-07 ENCOUNTER — Telehealth: Payer: Self-pay | Admitting: Oncology

## 2015-12-07 NOTE — Telephone Encounter (Signed)
Called patient to conf appt. L/M Appt letter and schd mailed. 12/07/15 °

## 2015-12-30 ENCOUNTER — Other Ambulatory Visit: Payer: Self-pay | Admitting: Oncology

## 2015-12-31 MED FILL — ZYTIGA 250 MG TABLET: 250 | 30 days supply | Qty: 120 | Fill #0

## 2016-01-03 ENCOUNTER — Ambulatory Visit (HOSPITAL_BASED_OUTPATIENT_CLINIC_OR_DEPARTMENT_OTHER): Payer: Medicare Other

## 2016-01-03 ENCOUNTER — Other Ambulatory Visit (HOSPITAL_BASED_OUTPATIENT_CLINIC_OR_DEPARTMENT_OTHER): Payer: Medicare Other

## 2016-01-03 ENCOUNTER — Telehealth: Payer: Self-pay | Admitting: Oncology

## 2016-01-03 ENCOUNTER — Ambulatory Visit (HOSPITAL_BASED_OUTPATIENT_CLINIC_OR_DEPARTMENT_OTHER): Payer: Medicare Other | Admitting: Oncology

## 2016-01-03 VITALS — BP 141/94 | HR 76 | Temp 97.9°F | Resp 17 | Ht 66.0 in | Wt 130.6 lb

## 2016-01-03 DIAGNOSIS — C61 Malignant neoplasm of prostate: Secondary | ICD-10-CM

## 2016-01-03 DIAGNOSIS — R634 Abnormal weight loss: Secondary | ICD-10-CM

## 2016-01-03 DIAGNOSIS — E291 Testicular hypofunction: Secondary | ICD-10-CM

## 2016-01-03 DIAGNOSIS — M898X9 Other specified disorders of bone, unspecified site: Secondary | ICD-10-CM

## 2016-01-03 DIAGNOSIS — C7951 Secondary malignant neoplasm of bone: Secondary | ICD-10-CM

## 2016-01-03 DIAGNOSIS — Z95828 Presence of other vascular implants and grafts: Secondary | ICD-10-CM

## 2016-01-03 LAB — CBC WITH DIFFERENTIAL/PLATELET
BASO%: 1.4 % (ref 0.0–2.0)
BASOS ABS: 0.1 10*3/uL (ref 0.0–0.1)
EOS ABS: 0.2 10*3/uL (ref 0.0–0.5)
EOS%: 4.7 % (ref 0.0–7.0)
HEMATOCRIT: 38.1 % — AB (ref 38.4–49.9)
HGB: 12.4 g/dL — ABNORMAL LOW (ref 13.0–17.1)
LYMPH#: 1.6 10*3/uL (ref 0.9–3.3)
LYMPH%: 34.1 % (ref 14.0–49.0)
MCH: 29.8 pg (ref 27.2–33.4)
MCHC: 32.7 g/dL (ref 32.0–36.0)
MCV: 91.4 fL (ref 79.3–98.0)
MONO#: 0.5 10*3/uL (ref 0.1–0.9)
MONO%: 10.5 % (ref 0.0–14.0)
NEUT#: 2.3 10*3/uL (ref 1.5–6.5)
NEUT%: 49.3 % (ref 39.0–75.0)
PLATELETS: 167 10*3/uL (ref 140–400)
RBC: 4.17 10*6/uL — AB (ref 4.20–5.82)
RDW: 13.8 % (ref 11.0–14.6)
WBC: 4.7 10*3/uL (ref 4.0–10.3)

## 2016-01-03 LAB — COMPREHENSIVE METABOLIC PANEL
ALT: 16 U/L (ref 0–55)
ANION GAP: 7 meq/L (ref 3–11)
AST: 28 U/L (ref 5–34)
Albumin: 3.3 g/dL — ABNORMAL LOW (ref 3.5–5.0)
Alkaline Phosphatase: 105 U/L (ref 40–150)
BUN: 13.3 mg/dL (ref 7.0–26.0)
CALCIUM: 9 mg/dL (ref 8.4–10.4)
CHLORIDE: 104 meq/L (ref 98–109)
CO2: 28 meq/L (ref 22–29)
CREATININE: 0.7 mg/dL (ref 0.7–1.3)
Glucose: 107 mg/dl (ref 70–140)
Potassium: 3.2 mEq/L — ABNORMAL LOW (ref 3.5–5.1)
Sodium: 139 mEq/L (ref 136–145)
Total Bilirubin: 0.64 mg/dL (ref 0.20–1.20)
Total Protein: 6.5 g/dL (ref 6.4–8.3)

## 2016-01-03 MED ORDER — SODIUM CHLORIDE 0.9 % IJ SOLN
10.0000 mL | INTRAMUSCULAR | Status: DC | PRN
  Administered 2016-01-03: 10 mL via INTRAVENOUS
  Filled 2016-01-03: qty 10

## 2016-01-03 MED ORDER — MEGESTROL ACETATE 400 MG/10ML PO SUSP: 400.0000 mg | Freq: Two times a day (BID) | ORAL | 1 refills | Status: DC

## 2016-01-03 MED ORDER — HEPARIN SOD (PORK) LOCK FLUSH 100 UNIT/ML IV SOLN
500.0000 [IU] | Freq: Once | INTRAVENOUS | Status: AC | PRN
  Administered 2016-01-03: 500 [IU] via INTRAVENOUS
  Filled 2016-01-03: qty 5

## 2016-01-03 NOTE — Telephone Encounter (Signed)
Gave patient relative and avs report for October

## 2016-01-03 NOTE — Progress Notes (Signed)
Hematology and Oncology Follow Up Visit  [name] [name] [id number] 05-26-1940 75 y.o. 01/03/2016 2:24 PM   Principle Diagnosis: 75 year old man with castration resistant metastatic prostate cancer with disease to the bone. His initial diagnosis was in 2012 where he presented with metastatic disease to the rib. His PSA was 187.   Prior Therapy:  He was found to have a mass in the right axilla near the fourth rib encasing the right with some sclerosis. He underwent a right VATS procedure and resection of the third, fourth and fifth rib and reconstruction with titanium plates under the care of Dr. Arlyce Dice on November 13 2010.  He went on to receive androgen deprivation therapy under the care of Dr. Gaynelle Arabian. His PSA nadir was to 0.1 back in May of 2013 and subsequently started to rise.  He was treated with Provenge in the fall of 2014.  After his PSA went up to 178.6 and Xtandi was started in February of 2015 and discontinued in 09/2013 due to progression of disease and poor tolerance. Taxotere chemotherapy started on 10/05/2013. He is status post 16 cycles of therapy last one given in April 2016 with a PSA reduction down to 0.44.   Current therapy: Zytiga 1000 mg daily started in June 2017.  Interim History:  [name] [name] presents today for a followup visit with his wife and son. Since his last visit, he reports doing well overall although did reports decrease in his appetite. He reports his amount of foods he is eating have decreased slowly over a period of time and of lost close to 8 pounds in the last few months. This might have been attributed to [facility] and had similar complaints while on chemotherapy.  He reports no other complications related to Zytiga including no lower extremity edema or fatigue. He does have lower extremity edema which is chronic in nature and have not changed. His blood pressure remain within normal range.  He continues to report chronic neck pain which have not changed.  He denies any new pain issues or arthralgias. He does not report any change in the quality or consistency of that pain. He has not taken any new pain medication.    He does not report any headaches or blurry vision or double vision. Has not reported any syncope or change in his mental status. He is not reporting any chest pain or shortness of breath.  Does not report any palpitation or leg edema. Does not report any cough, hemoptysis or wheezing. Does not report any frequency urgency or hesitancy. Does not report any skeletal complications. Does not report any lymphadenopathy or petechiae. Remainder of his review of systems unremarkable.  Medications: I have reviewed the patient's current medications.  Current Outpatient Prescriptions  Medication Sig Dispense Refill  . acetaminophen (TYLENOL) 325 MG tablet Take 650 mg by mouth every 6 (six) hours as needed for moderate pain.     Marland Kitchen aspirin EC 81 MG tablet Take 81 mg by mouth at bedtime.    . Calcium Citrate-Vitamin D (CITRACAL + D PO) Take 3 tablets by mouth every morning.    . cholecalciferol (VITAMIN D) 400 UNITS TABS tablet Take 1,500 Units by mouth.    . ferrous sulfate 325 (65 FE) MG tablet Take 325 mg by mouth daily with breakfast.    . ibuprofen (ADVIL,MOTRIN) 200 MG tablet Take 200 mg by mouth every 4 (four) hours as needed for moderate pain.    Marland Kitchen lidocaine-prilocaine (EMLA) cream Apply 1 application topically as  needed. 30 g 2  . pantoprazole (PROTONIX) 40 MG tablet Take 1 tablet (40 mg total) by mouth 2 (two) times daily before a meal. 60 tablet 5  . potassium chloride SA (K-DUR,KLOR-CON) 20 MEQ tablet TAKE 1 TABLET BY MOUTH DAILY 30 tablet 0  . prochlorperazine (COMPAZINE) 10 MG tablet Take 1 tablet (10 mg total) by mouth every 6 (six) hours as needed for nausea or vomiting. 30 tablet 0  . simvastatin (ZOCOR) 40 MG tablet Take 40 mg by mouth daily. Reported on 08/14/2015    . ZYTIGA 250 MG tablet TAKE 4 TABLETS BY MOUTH DAILY. TAKE ON AN  EMPTY STOMACH 1 HOUR BEFORE OR 2 HOURS AFTER A MEAL 120 tablet 0  . megestrol (MEGACE) 400 MG/10ML suspension Take 10 mLs (400 mg total) by mouth 2 (two) times daily. 240 mL 1   No current facility-administered medications for this visit.      Allergies:  Allergies  Allergen Reactions  . Oxycodone Nausea And Vomiting  . Penicillins Itching and Rash    Past Medical History, Surgical history, Social history, and Family History were reviewed and updated.   Physical Exam: Blood pressure (!) 141/94, pulse 76, temperature 97.9 F (36.6 C), temperature source Oral, resp. rate 17, height 5\' 6"  (1.676 m), weight 130 lb 9.6 oz (59.2 kg), SpO2 99 %. ECOG:  1 General appearance: Chronically ill appearing gentleman without distress. Head: Normocephalic, without obvious abnormality no oral ulcers or lesions. Neck: no adenopathy or tenderness. Lymph nodes: Cervical, supraclavicular, and axillary nodes normal. Heart:regular rate and rhythm, S1, S2 normal, no murmur, click, rub or gallop  Chest wall examination revealed Port-A-Cath site without erythema or induration. Lung:chest clear, no wheezing, rales, normal symmetric air entry.  Abdomin: soft, non-tender, without masses or organomegaly no shifting dullness or ascites. EXT: Trace edema noted. Neurological: No sensory, motor deficits noted.   Lab Results: Lab Results  Component Value Date   WBC 4.7 01/03/2016   HGB 12.4 (L) 01/03/2016   HCT 38.1 (L) 01/03/2016   MCV 91.4 01/03/2016   PLT 167 01/03/2016     Chemistry      Component Value Date/Time   NA 139 01/03/2016 1313   K 3.2 (L) 01/03/2016 1313   CL 107 03/15/2014 0553   CO2 28 01/03/2016 1313   BUN 13.3 01/03/2016 1313   CREATININE 0.7 01/03/2016 1313      Component Value Date/Time   CALCIUM 9.0 01/03/2016 1313   ALKPHOS 105 01/03/2016 1313   AST 28 01/03/2016 1313   ALT 16 01/03/2016 1313   BILITOT 0.64 01/03/2016 1313      Results for [name], [name] (MRN  [id number]) as of 01/03/2016 14:26  Ref. Range 09/30/2015 10:12 11/01/2015 14:10 12/04/2015 14:38  PSA Latest Ref Range: 0.0 - 4.0 ng/mL 6.8 (H) 2.9 2.4     Impression and Plan:  75 year old gentleman with the following issues:  1. 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 After progressing on Xtandi, he received systemic chemotherapy in the form of Taxotere between June 2015 till April 2016 for a total of 16 cycles. His PSA dropped down to 0.44 from 176.  His PSA increased up to 6.8 in June 2017 with bony metastasis that have been relatively stable. He started on Zytiga in June 2017.  His PSA have responded reasonably well down to 2.4 on the current dose of Zytiga. He is experiencing weight loss and decrease in his appetite. If he continues to have  this issue have recommended decreasing the dose to 750 mg daily.    2. Bone pain: arthritic in nature without any recent changes. Pain quality has not changed since the last visit.  3. Dental issues: He has been off because of dental related issues. We'll continue to hold this medication.  4. IV access: Status post right anterior chest Port-A-Cath placement. This will be flushed every 8 weeks. This will be flushed again in December 2017.  5. Androgen depravation: We will check his testosterone was future visits and restart androgen deprivation therapy if his testosterone status to rise.  6. Weight loss: I have recommended restarting Megace and consider Zytiga dose reduction in the future.  7. Followup: Will be in 4 weeks to review his tolerance to this medication.   [facility], MD  9/15/20172:24 PM

## 2016-01-04 LAB — TESTOSTERONE: Testosterone, Serum: <3 ng/dL — ABNORMAL LOW (ref 264–916)

## 2016-01-04 LAB — PSA: Prostate Specific Ag, Serum: 2.3 ng/mL (ref 0.0–4.0)

## 2016-01-15 ENCOUNTER — Encounter: Payer: Self-pay | Admitting: *Deleted

## 2016-01-28 ENCOUNTER — Other Ambulatory Visit: Payer: Self-pay | Admitting: Oncology

## 2016-01-29 ENCOUNTER — Other Ambulatory Visit: Payer: Self-pay | Admitting: Oncology

## 2016-01-29 MED FILL — ZYTIGA 250 MG TABLET: 250 | 30 days supply | Qty: 120 | Fill #0

## 2016-01-30 ENCOUNTER — Other Ambulatory Visit: Payer: Self-pay | Admitting: Oncology

## 2016-01-30 DIAGNOSIS — C61 Malignant neoplasm of prostate: Secondary | ICD-10-CM

## 2016-02-05 ENCOUNTER — Ambulatory Visit (HOSPITAL_BASED_OUTPATIENT_CLINIC_OR_DEPARTMENT_OTHER): Payer: Medicare Other

## 2016-02-05 ENCOUNTER — Ambulatory Visit (HOSPITAL_BASED_OUTPATIENT_CLINIC_OR_DEPARTMENT_OTHER): Payer: Medicare Other | Admitting: Oncology

## 2016-02-05 ENCOUNTER — Telehealth: Payer: Self-pay | Admitting: Oncology

## 2016-02-05 ENCOUNTER — Other Ambulatory Visit (HOSPITAL_BASED_OUTPATIENT_CLINIC_OR_DEPARTMENT_OTHER): Payer: Medicare Other

## 2016-02-05 VITALS — BP 114/66 | HR 100 | Temp 98.6°F | Resp 18 | Ht 66.0 in | Wt 138.3 lb

## 2016-02-05 DIAGNOSIS — C61 Malignant neoplasm of prostate: Secondary | ICD-10-CM

## 2016-02-05 DIAGNOSIS — E291 Testicular hypofunction: Secondary | ICD-10-CM

## 2016-02-05 DIAGNOSIS — M898X9 Other specified disorders of bone, unspecified site: Secondary | ICD-10-CM

## 2016-02-05 DIAGNOSIS — C7951 Secondary malignant neoplasm of bone: Secondary | ICD-10-CM

## 2016-02-05 DIAGNOSIS — I10 Essential (primary) hypertension: Secondary | ICD-10-CM

## 2016-02-05 DIAGNOSIS — Z95828 Presence of other vascular implants and grafts: Secondary | ICD-10-CM

## 2016-02-05 LAB — CBC WITH DIFFERENTIAL/PLATELET
BASO%: 0.1 % (ref 0.0–2.0)
Basophils Absolute: 0 10*3/uL (ref 0.0–0.1)
EOS ABS: 0.1 10*3/uL (ref 0.0–0.5)
EOS%: 1 % (ref 0.0–7.0)
HCT: 37.7 % — ABNORMAL LOW (ref 38.4–49.9)
HGB: 13 g/dL (ref 13.0–17.1)
LYMPH%: 25.3 % (ref 14.0–49.0)
MCH: 30.9 pg (ref 27.2–33.4)
MCHC: 34.5 g/dL (ref 32.0–36.0)
MCV: 89.5 fL (ref 79.3–98.0)
MONO#: 0.7 10*3/uL (ref 0.1–0.9)
MONO%: 6.9 % (ref 0.0–14.0)
NEUT#: 6.3 10*3/uL (ref 1.5–6.5)
NEUT%: 66.7 % (ref 39.0–75.0)
PLATELETS: 187 10*3/uL (ref 140–400)
RBC: 4.21 10*6/uL (ref 4.20–5.82)
RDW: 15.6 % — ABNORMAL HIGH (ref 11.0–14.6)
WBC: 9.5 10*3/uL (ref 4.0–10.3)
lymph#: 2.4 10*3/uL (ref 0.9–3.3)

## 2016-02-05 LAB — COMPREHENSIVE METABOLIC PANEL
ALT: 13 U/L (ref 0–55)
ANION GAP: 7 meq/L (ref 3–11)
AST: 17 U/L (ref 5–34)
Albumin: 3.4 g/dL — ABNORMAL LOW (ref 3.5–5.0)
Alkaline Phosphatase: 60 U/L (ref 40–150)
BUN: 21.7 mg/dL (ref 7.0–26.0)
CHLORIDE: 106 meq/L (ref 98–109)
CO2: 23 meq/L (ref 22–29)
Calcium: 8.9 mg/dL (ref 8.4–10.4)
Creatinine: 0.9 mg/dL (ref 0.7–1.3)
EGFR: 83 mL/min/{1.73_m2} — AB (ref >90)
Glucose: 112 mg/dl (ref 70–140)
Potassium: 4.2 mEq/L (ref 3.5–5.1)
Sodium: 136 mEq/L (ref 136–145)
Total Bilirubin: 0.78 mg/dL (ref 0.20–1.20)
Total Protein: 6.4 g/dL (ref 6.4–8.3)

## 2016-02-05 MED ORDER — SODIUM CHLORIDE 0.9 % IJ SOLN
10.0000 mL | INTRAMUSCULAR | Status: DC | PRN
  Administered 2016-02-05: 10 mL via INTRAVENOUS
  Filled 2016-02-05: qty 10

## 2016-02-05 MED ORDER — HEPARIN SOD (PORK) LOCK FLUSH 100 UNIT/ML IV SOLN
500.0000 [IU] | Freq: Once | INTRAVENOUS | Status: AC | PRN
  Administered 2016-02-05: 500 [IU] via INTRAVENOUS
  Filled 2016-02-05: qty 5

## 2016-02-05 NOTE — Telephone Encounter (Signed)
AVS report and appointment schedule given to patient, per 02/05/16 los.

## 2016-02-05 NOTE — Progress Notes (Signed)
Hematology and Oncology Follow Up Visit  [name] [name] [id number] 1941-01-25 75 y.o. 02/05/2016 3:08 PM   Principle Diagnosis: 75 year old man with castration resistant metastatic prostate cancer with disease to the bone. His initial diagnosis was in 2012 where he presented with metastatic disease to the rib. His PSA was 187.   Prior Therapy:  He was found to have a mass in the right axilla near the fourth rib encasing the right with some sclerosis. He underwent a right VATS procedure and resection of the third, fourth and fifth rib and reconstruction with titanium plates under the care of Dr. Arlyce [name] on November 13 2010.  He went on to receive androgen deprivation therapy under the care of Dr. Gaynelle [name]. His PSA nadir was to 0.1 back in May of 2013 and subsequently started to rise.  He was treated with Provenge in the fall of 2014.  After his PSA went up to 178.6 and Xtandi was started in February of 2015 and discontinued in 09/2013 due to progression of disease and poor tolerance. Taxotere chemotherapy started on 10/05/2013. He is status post 16 cycles of therapy last one given in April 2016 with a PSA reduction down to 0.44.   Current therapy: Zytiga 1000 mg daily started in June 2017.  Interim History:  [name] [name] presents today for a followup visit with his wife. Since his last visit, he reports improvement in his appetite and currently have gained 8 pounds since last visit. He feels the Megace has helped his symptoms and have been feeling hungrier than usual.  He reports no new complications related to Zytiga including no lower extremity edema or fatigue. He reports no excessive fatigue, nausea or other complaints. He remains on potassium supplements once a day without any major changes. He reports he has been taking it on a daily basis at the full dose without any dose reduction.  He continues to report chronic neck pain which have not changed. He denies any back pain or hip pain. He  denies any pathological fractures, thousand myalgias.    He does not report any headaches or blurry vision or double vision. Has not reported any syncope or change in his mental status. He is not reporting any chest pain or shortness of breath.  Does not report any palpitation or leg edema. Does not report any cough, hemoptysis or wheezing. Does not report any frequency urgency or hesitancy. Does not report any skeletal complications. Does not report any lymphadenopathy or petechiae. Remainder of his review of systems unremarkable.  Medications: I have reviewed the patient's current medications.  Current Outpatient Prescriptions  Medication Sig Dispense Refill  . acetaminophen (TYLENOL) 325 MG tablet Take 650 mg by mouth every 6 (six) hours as needed for moderate pain.     Marland Kitchen aspirin EC 81 MG tablet Take 81 mg by mouth at bedtime.    . Calcium Citrate-Vitamin D (CITRACAL + D PO) Take 3 tablets by mouth every morning.    . cholecalciferol (VITAMIN D) 400 UNITS TABS tablet Take 1,500 Units by mouth.    . ferrous sulfate 325 (65 FE) MG tablet Take 325 mg by mouth daily with breakfast.    . ibuprofen (ADVIL,MOTRIN) 200 MG tablet Take 200 mg by mouth every 4 (four) hours as needed for moderate pain.    Marland Kitchen lidocaine-prilocaine (EMLA) cream Apply 1 application topically as needed. 30 g 2  . megestrol (MEGACE) 40 MG/ML suspension TAKE 2 TEASPOONFUL (10 ML) BY MOUTH TWICE DAILY (CASH PRICE $23.00 PER  ADC) 240 mL 1  . pantoprazole (PROTONIX) 40 MG tablet Take 1 tablet (40 mg total) by mouth 2 (two) times daily before a meal. 60 tablet 5  . potassium chloride SA (K-DUR,KLOR-CON) 20 MEQ tablet TAKE 1 TABLET BY MOUTH DAILY 30 tablet 0  . prochlorperazine (COMPAZINE) 10 MG tablet Take 1 tablet (10 mg total) by mouth every 6 (six) hours as needed for nausea or vomiting. 30 tablet 0  . simvastatin (ZOCOR) 40 MG tablet Take 40 mg by mouth daily. Reported on 08/14/2015    . ZYTIGA 250 MG tablet TAKE 4 TABLETS BY MOUTH  DAILY. TAKE ON AN EMPTY STOMACH 1 HOUR BEFORE OR 2 HOURS AFTER A MEAL 120 tablet 0   No current facility-administered medications for this visit.      Allergies:  Allergies  Allergen Reactions  . Oxycodone Nausea And Vomiting  . Penicillins Itching and Rash    Past Medical History, Surgical history, Social history, and Family History were reviewed and updated.   Physical Exam: Blood pressure 114/66, pulse 100, temperature 98.6 F (37 C), temperature source Oral, resp. rate 18, height 5\' 6"  (1.676 m), weight 138 lb 4.8 oz (62.7 kg), SpO2 100 %. ECOG:  1 General appearance: Alert, awake gentleman without distress. Head: Normocephalic, without obvious abnormality no oral thrush. Neck: no adenopathy or tenderness. Lymph nodes: Cervical, supraclavicular, and axillary nodes normal. Heart:regular rate and rhythm, S1, S2 normal, no murmur, click, rub or gallop  Chest wall examination revealed Port-A-Cath site without erythema or induration. Lung:chest clear, no wheezing, rales, normal symmetric air entry.  Abdomin: soft, non-tender, without masses or organomegaly no rebound or guarding. EXT: Trace edema noted. Neurological: No deficits noted.   Lab Results: Lab Results  Component Value Date   WBC 9.5 02/05/2016   HGB 13.0 02/05/2016   HCT 37.7 (L) 02/05/2016   MCV 89.5 02/05/2016   PLT 187 02/05/2016     Chemistry      Component Value Date/Time   NA 139 01/03/2016 1313   K 3.2 (L) 01/03/2016 1313   CL 107 03/15/2014 0553   CO2 28 01/03/2016 1313   BUN 13.3 01/03/2016 1313   CREATININE 0.7 01/03/2016 1313      Component Value Date/Time   CALCIUM 9.0 01/03/2016 1313   ALKPHOS 105 01/03/2016 1313   AST 28 01/03/2016 1313   ALT 16 01/03/2016 1313   BILITOT 0.64 01/03/2016 1313       Results for [name] [name] (MRN [id number]) as of 02/05/2016 14:47  Ref. Range 12/04/2015 14:38 01/03/2016 13:13 01/03/2016 13:13  PSA Latest Ref Range: 0.0 - 4.0 ng/mL 2.4  2.3      Impression and Plan:  75 year old gentleman with the following issues:  1. 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 After progressing on Xtandi, he received systemic chemotherapy in the form of Taxotere between June 2015 till April 2016 for a total of 16 cycles. His PSA dropped down to 0.44 from 176.  His PSA increased up to 6.8 in June 2017 with bony metastasis that have been relatively stable. He started on Zytiga in June 2017.  He continues to tolerate Zytiga reasonably well and his weight loss has improved. His PSA continues to decline slowly currently at 2.3. The plan is to continue with the same dose and schedule for the time being and continue to monitor his PSA periodically.    2. Bone pain: arthritic in nature without any recent changes. Pain quality has  not changed since the last visit.  3. Hypertension: His blood pressure have been within normal range on Zytiga.  4. IV access: Port-A-Cath will be flushed with each visit. His next flush will be with his labs in December 2017.  5. 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  6. Weight loss: Weight is improved at this time with Megace without any reduction of the Zytiga dose.  7. Followup: Will be in 6 weeks for a follow-up visit.   Zola Button, MD  10/18/20173:08 PM

## 2016-02-06 ENCOUNTER — Telehealth: Payer: Self-pay | Admitting: *Deleted

## 2016-02-06 LAB — PSA: PROSTATE SPECIFIC AG, SERUM: 3.6 ng/mL (ref 0.0–4.0)

## 2016-02-06 LAB — TESTOSTERONE: Testosterone, Serum: <3 ng/dL — ABNORMAL LOW (ref 264–916)

## 2016-02-06 NOTE — Telephone Encounter (Signed)
-----   Message from Wyatt Portela, MD sent at 02/06/2016  8:30 AM EDT ----- Please let him know his PSA. Slightly up but no change.

## 2016-02-06 NOTE — Progress Notes (Signed)
Spoke with wife ellen, gave results of last PSA

## 2016-02-06 NOTE — Telephone Encounter (Signed)
LM for patient to call me.

## 2016-02-11 ENCOUNTER — Encounter (INDEPENDENT_AMBULATORY_CARE_PROVIDER_SITE_OTHER): Payer: Self-pay | Admitting: Internal Medicine

## 2016-02-11 ENCOUNTER — Ambulatory Visit (INDEPENDENT_AMBULATORY_CARE_PROVIDER_SITE_OTHER): Payer: Medicare Other | Admitting: Internal Medicine

## 2016-02-11 VITALS — BP 120/76 | HR 78 | Temp 97.6°F | Resp 18 | Ht 66.0 in | Wt 140.5 lb

## 2016-02-11 DIAGNOSIS — Z8719 Personal history of other diseases of the digestive system: Secondary | ICD-10-CM

## 2016-02-11 DIAGNOSIS — K2100 Gastro-esophageal reflux disease with esophagitis, without bleeding: Secondary | ICD-10-CM

## 2016-02-11 NOTE — Patient Instructions (Signed)
Decrease pantoprazole to 40 mg by mouth daily before breakfast. Can go back to twice daily dose of symptoms relapse. If he does well with once a day dose will change prescription at next refill.

## 2016-02-11 NOTE — Progress Notes (Signed)
Presenting complaint;  Follow-up her complicated GERD.  Database and Subjective:  Patient is 75 year old Caucasian male who is here for scheduled visit accompanied by his wife [name]. He was last seen on 08/13/2015. He was initially seen in November 2015 with upper GI bleed when he was found to have Barrett's esophagus ulcerative reflux esophagitis and a moderate sliding hiatal hernia. He has been maintained on pantoprazole twice daily. He did not undergo biopsies because of inflammation. He has no complaints. He states he does not even remember the last time he had heartburn or regurgitation. He denies dysphagia. His appetite had decreased until he was begun on Megace. He also has been started on new medication for metastatic prostate carcinoma and his PSA is coming down. His bowels move daily. He denies melena or rectal bleeding. Patient's wife states he has been sleeping in a recliner and able to sleep very well.   Current Medications: Outpatient Encounter Prescriptions as of 02/11/2016  Medication Sig  . acetaminophen (TYLENOL) 325 MG tablet Take 650 mg by mouth every 6 (six) hours as needed for moderate pain.   Marland Kitchen aspirin EC 81 MG tablet Take 81 mg by mouth at bedtime.  . Calcium Citrate-Vitamin D (CITRACAL + D PO) Take 3 tablets by mouth every morning.  . cholecalciferol (VITAMIN D) 400 UNITS TABS tablet Take 1,500 Units by mouth.  . ferrous sulfate 325 (65 FE) MG tablet Take 325 mg by mouth daily with breakfast.  . ibuprofen (ADVIL,MOTRIN) 200 MG tablet Take 200 mg by mouth every 4 (four) hours as needed for moderate pain.  Marland Kitchen lidocaine-prilocaine (EMLA) cream Apply 1 application topically as needed.  . megestrol (MEGACE) 40 MG/ML suspension TAKE 2 TEASPOONFUL (10 ML) BY MOUTH TWICE DAILY (CASH PRICE $23.00 PER ADC)  . pantoprazole (PROTONIX) 40 MG tablet Take 1 tablet (40 mg total) by mouth 2 (two) times daily before a meal.  . potassium chloride SA (K-DUR,KLOR-CON) 20 MEQ tablet TAKE 1  TABLET BY MOUTH DAILY  . prochlorperazine (COMPAZINE) 10 MG tablet Take 1 tablet (10 mg total) by mouth every 6 (six) hours as needed for nausea or vomiting.  . simvastatin (ZOCOR) 40 MG tablet Take 40 mg by mouth daily. Reported on 08/14/2015  . ZYTIGA 250 MG tablet TAKE 4 TABLETS BY MOUTH DAILY. TAKE ON AN EMPTY STOMACH 1 HOUR BEFORE OR 2 HOURS AFTER A MEAL   No facility-administered encounter medications on file as of 02/11/2016.      Objective: Blood pressure 120/76, pulse 78, temperature 97.6 F (36.4 C), temperature source Oral, resp. rate 18, height 5\' 6"  (1.676 m), weight 140 lb 8 oz (63.7 kg). Patient is alert and in no acute distress. He has flexion deformity to his neck. Conjunctiva is pink. Sclera is nonicteric Oropharyngeal mucosa is normal. No neck masses or thyromegaly noted. Cardiac exam with regular rhythm normal S1 and S2. No murmur or gallop noted. Lungs are clear to auscultation. Abdomen is soft and nontender without organomegaly or masses. No LE edema or clubbing noted.  Labs/studies Results: Lab data from 02/05/2016  WBC 9.5, H&H 13 and 37.7 and platelet count 187K.  Bilirubin 0.78, AP 60, AST 17, ALT 13 total protein 6.4 and albumin 3.4.  Calcium is 8.9.  Glucose 112  BUN 21.7 and creatinine 0.9.    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 He has done well with double dose PPI and anti-reflx measures. He may do well  with  PPI dose reduction.   Plan:  Continue anti-reflex measures as before. Decrease pantoprazole to 40 mg by mouth every morning. Can go back to double dose PPI if has frequent breakthrough symptoms particularly at night. If he does well with single dose PPI will change prescription at next refill. Office visit in 6 months.

## 2016-02-26 ENCOUNTER — Other Ambulatory Visit: Payer: Self-pay | Admitting: Oncology

## 2016-02-26 ENCOUNTER — Encounter: Payer: Self-pay | Admitting: *Deleted

## 2016-02-26 MED FILL — ZYTIGA 250 MG TABLET: 250 | 30 days supply | Qty: 120 | Fill #0

## 2016-03-02 ENCOUNTER — Other Ambulatory Visit: Payer: Self-pay | Admitting: Oncology

## 2016-03-03 ENCOUNTER — Other Ambulatory Visit: Payer: Self-pay | Admitting: Oncology

## 2016-03-03 DIAGNOSIS — C61 Malignant neoplasm of prostate: Secondary | ICD-10-CM

## 2016-03-25 ENCOUNTER — Telehealth: Payer: Self-pay | Admitting: Oncology

## 2016-03-25 ENCOUNTER — Other Ambulatory Visit (HOSPITAL_BASED_OUTPATIENT_CLINIC_OR_DEPARTMENT_OTHER): Payer: Medicare Other

## 2016-03-25 ENCOUNTER — Other Ambulatory Visit: Payer: Self-pay | Admitting: *Deleted

## 2016-03-25 ENCOUNTER — Ambulatory Visit: Payer: Medicare Other

## 2016-03-25 ENCOUNTER — Ambulatory Visit (HOSPITAL_BASED_OUTPATIENT_CLINIC_OR_DEPARTMENT_OTHER): Payer: Medicare Other

## 2016-03-25 ENCOUNTER — Ambulatory Visit (HOSPITAL_BASED_OUTPATIENT_CLINIC_OR_DEPARTMENT_OTHER): Payer: Medicare Other | Admitting: Oncology

## 2016-03-25 VITALS — BP 114/63 | HR 79 | Temp 97.8°F | Resp 16 | Ht 66.0 in | Wt 142.4 lb

## 2016-03-25 DIAGNOSIS — C61 Malignant neoplasm of prostate: Secondary | ICD-10-CM

## 2016-03-25 DIAGNOSIS — E291 Testicular hypofunction: Secondary | ICD-10-CM

## 2016-03-25 DIAGNOSIS — C7951 Secondary malignant neoplasm of bone: Secondary | ICD-10-CM

## 2016-03-25 DIAGNOSIS — Z95828 Presence of other vascular implants and grafts: Secondary | ICD-10-CM

## 2016-03-25 LAB — COMPREHENSIVE METABOLIC PANEL
ALBUMIN: 3.4 g/dL — AB (ref 3.5–5.0)
ALT: 18 U/L (ref 0–55)
AST: 19 U/L (ref 5–34)
Alkaline Phosphatase: 75 U/L (ref 40–150)
Anion Gap: 9 mEq/L (ref 3–11)
BUN: 22.5 mg/dL (ref 7.0–26.0)
CALCIUM: 9.3 mg/dL (ref 8.4–10.4)
CHLORIDE: 106 meq/L (ref 98–109)
CO2: 21 mEq/L — ABNORMAL LOW (ref 22–29)
CREATININE: 1 mg/dL (ref 0.7–1.3)
EGFR: 76 mL/min/{1.73_m2} — ABNORMAL LOW (ref >90)
Glucose: 80 mg/dl (ref 70–140)
Potassium: 3.8 mEq/L (ref 3.5–5.1)
Sodium: 136 mEq/L (ref 136–145)
TOTAL PROTEIN: 7 g/dL (ref 6.4–8.3)
Total Bilirubin: 0.62 mg/dL (ref 0.20–1.20)

## 2016-03-25 LAB — CBC WITH DIFFERENTIAL/PLATELET
BASO%: 0.5 % (ref 0.0–2.0)
Basophils Absolute: 0.1 10*3/uL (ref 0.0–0.1)
EOS%: 2.9 % (ref 0.0–7.0)
Eosinophils Absolute: 0.3 10*3/uL (ref 0.0–0.5)
HEMATOCRIT: 38.7 % (ref 38.4–49.9)
HEMOGLOBIN: 12.8 g/dL — AB (ref 13.0–17.1)
LYMPH#: 2.3 10*3/uL (ref 0.9–3.3)
LYMPH%: 22.9 % (ref 14.0–49.0)
MCH: 31.7 pg (ref 27.2–33.4)
MCHC: 33 g/dL (ref 32.0–36.0)
MCV: 95.9 fL (ref 79.3–98.0)
MONO#: 0.8 10*3/uL (ref 0.1–0.9)
MONO%: 8.5 % (ref 0.0–14.0)
NEUT%: 65.2 % (ref 39.0–75.0)
NEUTROS ABS: 6.4 10*3/uL (ref 1.5–6.5)
Platelets: 290 10*3/uL (ref 140–400)
RBC: 4.03 10*6/uL — ABNORMAL LOW (ref 4.20–5.82)
RDW: 15.6 % — AB (ref 11.0–14.6)
WBC: 9.9 10*3/uL (ref 4.0–10.3)

## 2016-03-25 MED ORDER — LIDOCAINE-PRILOCAINE 2.5-2.5 % EX CREA: 1.0000 "application " | TOPICAL_CREAM | CUTANEOUS | 2 refills | Status: AC | PRN

## 2016-03-25 MED ORDER — SODIUM CHLORIDE 0.9 % IJ SOLN
10.0000 mL | INTRAMUSCULAR | Status: DC | PRN
  Administered 2016-03-25: 10 mL via INTRAVENOUS
  Filled 2016-03-25: qty 10

## 2016-03-25 MED ORDER — HEPARIN SOD (PORK) LOCK FLUSH 100 UNIT/ML IV SOLN
500.0000 [IU] | Freq: Once | INTRAVENOUS | Status: AC | PRN
  Administered 2016-03-25: 500 [IU] via INTRAVENOUS
  Filled 2016-03-25: qty 5

## 2016-03-25 NOTE — Telephone Encounter (Signed)
Labs added for today, per 03/25/16 los. Appointments scheduled per 03/25/16 los. A copy of the AVS report and appointment schedule was given to the patient, per 03/25/16 los.

## 2016-03-25 NOTE — Progress Notes (Signed)
Hematology and Oncology Follow Up Visit  Cadon Kiraly [id number] May 27, 1940 75 y.o. 03/25/2016 1:40 PM   Principle Diagnosis: 75 year old man with castration resistant metastatic prostate cancer with disease to the bone. His initial diagnosis was in 2012 where he presented with metastatic disease to the rib. His PSA was 187.   Prior Therapy:  He was found to have a mass in the right axilla near the fourth rib encasing the right with some sclerosis. He underwent a right VATS procedure and resection of the third, fourth and fifth rib and reconstruction with titanium plates under the care of Dr. Arlyce Dice on November 13 2010.  He went on to receive androgen deprivation therapy under the care of Dr. Gaynelle Arabian. His PSA nadir was to 0.1 back in May of 2013 and subsequently started to rise.  He was treated with Provenge in the fall of 2014.  After his PSA went up to 178.6 and Xtandi was started in February of 2015 and discontinued in 09/2013 due to progression of disease and poor tolerance. Taxotere chemotherapy started on 10/05/2013. He is status post 16 cycles of therapy last one given in April 2016 with a PSA reduction down to 0.44.   Current therapy: Zytiga 1000 mg daily started in June 2017.   Interim History:  Mr. Ravenscraft presents today for a followup visit with his wife. Since his last visit, he reports no recent complaints or changes. He continues to improve his appetite and have gained more weight since last visit. He feels the Megace has helped in boosting his appetite. He denied any recent bone pain or fractures. Has not reported any falls or syncope. Does not report any major changes in his performance status or activity level.  He continues to take Zytiga without any major complications. He reports no excessive fatigue, nausea or other complaints. He remains on potassium supplements once a day without any major changes.      He does not report any headaches or blurry vision or double  vision. Has not reported any syncope or change in his mental status. He is not reporting any chest pain or shortness of breath.  Does not report any palpitation or leg edema. Does not report any cough, hemoptysis or wheezing. Does not report any frequency urgency or hesitancy. Does not report any skeletal complications. Does not report any lymphadenopathy or petechiae. Remainder of his review of systems unremarkable.  Medications: I have reviewed the patient's current medications.  Current Outpatient Prescriptions  Medication Sig Dispense Refill  . acetaminophen (TYLENOL) 325 MG tablet Take 650 mg by mouth every 6 (six) hours as needed for moderate pain.     Marland Kitchen aspirin EC 81 MG tablet Take 81 mg by mouth at bedtime.    . Calcium Citrate-Vitamin D (CITRACAL + D PO) Take 3 tablets by mouth every morning.    . cholecalciferol (VITAMIN D) 400 UNITS TABS tablet Take 1,500 Units by mouth.    . ferrous sulfate 325 (65 FE) MG tablet Take 325 mg by mouth daily with breakfast.    . ibuprofen (ADVIL,MOTRIN) 200 MG tablet Take 200 mg by mouth every 4 (four) hours as needed for moderate pain.    Marland Kitchen lidocaine-prilocaine (EMLA) cream Apply 1 application topically as needed. 30 g 2  . megestrol (MEGACE) 40 MG/ML suspension TAKE 2 TEASPOONFUL (10 ML) BY MOUTH TWICE DAILY (CASH PRICE $23.00 PER ADC) 240 mL 1  . pantoprazole (PROTONIX) 40 MG tablet Take 1 tablet (40 mg total) by mouth 2 (  two) times daily before a meal. 60 tablet 5  . potassium chloride SA (K-DUR,KLOR-CON) 20 MEQ tablet TAKE 1 TABLET BY MOUTH DAILY 30 tablet 0  . prochlorperazine (COMPAZINE) 10 MG tablet Take 1 tablet (10 mg total) by mouth every 6 (six) hours as needed for nausea or vomiting. 30 tablet 0  . simvastatin (ZOCOR) 40 MG tablet Take 40 mg by mouth daily. Reported on 08/14/2015    . ZYTIGA 250 MG tablet TAKE 4 TABLETS BY MOUTH DAILY. TAKE ON AN EMPTY STOMACH 1 HOUR BEFORE OR 2 HOURS AFTER A MEAL 120 tablet 0   No current  facility-administered medications for this visit.      Allergies:  Allergies  Allergen Reactions  . Oxycodone Nausea And Vomiting  . Penicillins Itching and Rash    Past Medical History, Surgical history, Social history, and Family History were reviewed and updated.   Physical Exam: Blood pressure 114/63, pulse 79, temperature 97.8 F (36.6 C), temperature source Oral, resp. rate 16, height 5\' 6"  (1.676 m), weight 142 lb 6.4 oz (64.6 kg), SpO2 98 %. ECOG:  1 General appearance: Elderly gentleman appeared without distress. Head: Normocephalic, without obvious abnormality no oral ulcers or lesions. Neck: no adenopathy or tenderness. Lymph nodes: Cervical, supraclavicular, and axillary nodes normal. Heart:regular rate and rhythm, S1, S2 normal, no murmur, click, rub or gallop  Chest wall examination revealed Port-A-Cath site without erythema or induration. Lung:chest clear, no wheezing, rales, normal symmetric air entry.  Abdomin: soft, non-tender, without masses or organomegaly no shifting dullness or ascites. EXT: Trace edema noted. Neurological: No deficits noted.   Lab Results: Lab Results  Component Value Date   WBC 9.5 02/05/2016   HGB 13.0 02/05/2016   HCT 37.7 (L) 02/05/2016   MCV 89.5 02/05/2016   PLT 187 02/05/2016     Chemistry      Component Value Date/Time   NA 136 02/05/2016 1420   K 4.2 02/05/2016 1420   CL 107 03/15/2014 0553   CO2 23 02/05/2016 1420   BUN 21.7 02/05/2016 1420   CREATININE 0.9 02/05/2016 1420      Component Value Date/Time   CALCIUM 8.9 02/05/2016 1420   ALKPHOS 60 02/05/2016 1420   AST 17 02/05/2016 1420   ALT 13 02/05/2016 1420   BILITOT 0.78 02/05/2016 1420        Results for JHAI, LOVEGROVE (MRN [id number]) as of 03/25/2016 13:21  Ref. Range 01/03/2016 13:13 02/05/2016 14:20  PSA Latest Ref Range: 0.0 - 4.0 ng/mL 2.3 3.6     Impression and Plan:  75 year old gentleman with the following issues:  1. Castration  resistant metastatic prostate cancer with metastatic disease to the bone. After progressing on Xtandi, he received systemic chemotherapy in the form of Taxotere between June 2015 till April 2016 for a total of 16 cycles. His PSA dropped down to 0.44 from 176.  His PSA increased up to 6.8 in June 2017 with bony metastasis that have been relatively stable. He started on Zytiga in June 2017.  He had an excellent response to [facility] with PSA down to 2.3. His most recent PSA was 3.6 in October 2017. I recommended continuing the same dose and schedule and monitor his PSA. His PSA continues to rise rapidly, different salvage therapy will be utilized. If some fluctuations of the PSA is noted, we'll continue Zytiga for the time being.    2. 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  3. Hypertension: His blood pressure have  been within normal range on Zytiga.  4. IV access: Port-A-Cath will be flushed with each visit. This will be flushed with the next visit as well.  5. 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  6. Weight loss: Weight is improved at this time with Megace without any reduction of the Zytiga dose.  7. Followup: Will be in 6 weeks for a follow-up visit.   [id number], MD  12/6/20171:40 PM

## 2016-03-25 NOTE — Patient Instructions (Signed)

## 2016-03-26 ENCOUNTER — Telehealth: Payer: Self-pay | Admitting: *Deleted

## 2016-03-26 LAB — PSA: PROSTATE SPECIFIC AG, SERUM: 4.3 ng/mL — AB (ref 0.0–4.0)

## 2016-03-26 NOTE — Telephone Encounter (Signed)
Spoke with patient's wife ellen, gave results of last PSA, no change in treatment for now.

## 2016-03-26 NOTE — Telephone Encounter (Signed)
-----   Message from [name] Portela, MD sent at 03/26/2016  8:37 AM EST ----- Please let him know his PSA is slightly up but no change is needed.

## 2016-03-30 ENCOUNTER — Other Ambulatory Visit: Payer: Self-pay | Admitting: Oncology

## 2016-03-31 MED FILL — ZYTIGA 250 MG TABLET: 250 | 30 days supply | Qty: 120 | Fill #0

## 2016-04-01 ENCOUNTER — Encounter: Payer: Self-pay | Admitting: Oncology

## 2016-04-01 NOTE — Progress Notes (Signed)
Faxed signed Non-Formulary Drug request form to [place]. Fax received ok per confirmation sheet.

## 2016-04-02 ENCOUNTER — Encounter: Payer: Self-pay | Admitting: Oncology

## 2016-04-02 NOTE — Progress Notes (Signed)
Received approval from [employer] for Lidocaine/Prilocaine cream.  Patient approved 01/03/16-07/01/16. [place] [facility]) to advise.

## 2016-04-03 ENCOUNTER — Encounter: Payer: Self-pay | Admitting: Oncology

## 2016-04-16 ENCOUNTER — Other Ambulatory Visit: Payer: Self-pay | Admitting: Oncology

## 2016-04-16 DIAGNOSIS — C61 Malignant neoplasm of prostate: Secondary | ICD-10-CM

## 2016-05-01 ENCOUNTER — Other Ambulatory Visit: Payer: Self-pay | Admitting: Oncology

## 2016-05-01 MED FILL — ZYTIGA 250 MG TABLET: 250 | 30 days supply | Qty: 120 | Fill #0

## 2016-05-05 ENCOUNTER — Telehealth: Payer: Self-pay | Admitting: Oncology

## 2016-05-05 ENCOUNTER — Other Ambulatory Visit: Payer: Self-pay | Admitting: Oncology

## 2016-05-05 NOTE — Telephone Encounter (Signed)
Appointments rescheduled per patient request. Patient does not wish to come to appointment in inclement weather.

## 2016-05-06 ENCOUNTER — Ambulatory Visit: Payer: Medicare Other | Admitting: Oncology

## 2016-05-06 ENCOUNTER — Other Ambulatory Visit: Payer: Medicare Other

## 2016-05-11 ENCOUNTER — Other Ambulatory Visit (HOSPITAL_BASED_OUTPATIENT_CLINIC_OR_DEPARTMENT_OTHER): Payer: Medicare Other

## 2016-05-11 ENCOUNTER — Ambulatory Visit (HOSPITAL_BASED_OUTPATIENT_CLINIC_OR_DEPARTMENT_OTHER): Payer: Medicare Other

## 2016-05-11 DIAGNOSIS — C7951 Secondary malignant neoplasm of bone: Secondary | ICD-10-CM

## 2016-05-11 DIAGNOSIS — C61 Malignant neoplasm of prostate: Secondary | ICD-10-CM | POA: Diagnosis not present

## 2016-05-11 DIAGNOSIS — Z95828 Presence of other vascular implants and grafts: Secondary | ICD-10-CM

## 2016-05-11 LAB — CBC WITH DIFFERENTIAL/PLATELET
BASO%: 0.6 % (ref 0.0–2.0)
Basophils Absolute: 0.1 10*3/uL (ref 0.0–0.1)
EOS%: 1.2 % (ref 0.0–7.0)
Eosinophils Absolute: 0.1 10*3/uL (ref 0.0–0.5)
HEMATOCRIT: 36.2 % — AB (ref 38.4–49.9)
HGB: 12.3 g/dL — ABNORMAL LOW (ref 13.0–17.1)
LYMPH#: 2.6 10*3/uL (ref 0.9–3.3)
LYMPH%: 25.8 % (ref 14.0–49.0)
MCH: 33.4 pg (ref 27.2–33.4)
MCHC: 34 g/dL (ref 32.0–36.0)
MCV: 98.2 fL — AB (ref 79.3–98.0)
MONO#: 0.7 10*3/uL (ref 0.1–0.9)
MONO%: 7.3 % (ref 0.0–14.0)
NEUT#: 6.5 10*3/uL (ref 1.5–6.5)
NEUT%: 65.1 % (ref 39.0–75.0)
PLATELETS: 239 10*3/uL (ref 140–400)
RBC: 3.68 10*6/uL — ABNORMAL LOW (ref 4.20–5.82)
RDW: 14 % (ref 11.0–14.6)
WBC: 10 10*3/uL (ref 4.0–10.3)

## 2016-05-11 LAB — COMPREHENSIVE METABOLIC PANEL
ALK PHOS: 46 U/L (ref 40–150)
ALT: 11 U/L (ref 0–55)
ANION GAP: 8 meq/L (ref 3–11)
AST: 15 U/L (ref 5–34)
Albumin: 3.6 g/dL (ref 3.5–5.0)
BUN: 19.4 mg/dL (ref 7.0–26.0)
CALCIUM: 9.3 mg/dL (ref 8.4–10.4)
CHLORIDE: 104 meq/L (ref 98–109)
CO2: 24 mEq/L (ref 22–29)
Creatinine: 1 mg/dL (ref 0.7–1.3)
EGFR: 74 mL/min/{1.73_m2} — AB (ref >90)
Glucose: 107 mg/dl (ref 70–140)
POTASSIUM: 4.1 meq/L (ref 3.5–5.1)
Sodium: 136 mEq/L (ref 136–145)
Total Bilirubin: 0.6 mg/dL (ref 0.20–1.20)
Total Protein: 6.4 g/dL (ref 6.4–8.3)

## 2016-05-11 MED ORDER — HEPARIN SOD (PORK) LOCK FLUSH 100 UNIT/ML IV SOLN
500.0000 [IU] | Freq: Once | INTRAVENOUS | Status: AC | PRN
  Administered 2016-05-11: 500 [IU] via INTRAVENOUS
  Filled 2016-05-11: qty 5

## 2016-05-11 MED ORDER — SODIUM CHLORIDE 0.9 % IJ SOLN
10.0000 mL | INTRAMUSCULAR | Status: DC | PRN
Start: 2016-05-11 — End: 2016-05-11
  Administered 2016-05-11: 10 mL via INTRAVENOUS
  Filled 2016-05-11: qty 10

## 2016-05-12 LAB — PSA: Prostate Specific Ag, Serum: 4.6 ng/mL — ABNORMAL HIGH (ref 0.0–4.0)

## 2016-05-26 ENCOUNTER — Encounter: Payer: Self-pay | Admitting: Oncology

## 2016-05-27 ENCOUNTER — Telehealth: Payer: Self-pay | Admitting: *Deleted

## 2016-05-27 NOTE — Telephone Encounter (Signed)
Spoke with wife ellen, okay to wait until feb 22nd for port flush. They may be flushed every 4-6 weeks.

## 2016-05-28 ENCOUNTER — Other Ambulatory Visit: Payer: Self-pay | Admitting: Oncology

## 2016-05-28 MED FILL — ZYTIGA 250 MG TABLET: 250 | 30 days supply | Qty: 120 | Fill #0

## 2016-06-11 ENCOUNTER — Other Ambulatory Visit: Payer: Self-pay | Admitting: *Deleted

## 2016-06-11 ENCOUNTER — Telehealth: Payer: Self-pay | Admitting: Oncology

## 2016-06-11 ENCOUNTER — Ambulatory Visit (HOSPITAL_BASED_OUTPATIENT_CLINIC_OR_DEPARTMENT_OTHER): Payer: Medicare Other | Admitting: Oncology

## 2016-06-11 VITALS — BP 125/78 | HR 94 | Temp 98.3°F | Resp 18 | Ht 66.0 in | Wt 154.5 lb

## 2016-06-11 DIAGNOSIS — C7951 Secondary malignant neoplasm of bone: Secondary | ICD-10-CM | POA: Diagnosis not present

## 2016-06-11 DIAGNOSIS — C61 Malignant neoplasm of prostate: Secondary | ICD-10-CM

## 2016-06-11 DIAGNOSIS — E291 Testicular hypofunction: Secondary | ICD-10-CM

## 2016-06-11 DIAGNOSIS — M898X8 Other specified disorders of bone, other site: Secondary | ICD-10-CM | POA: Diagnosis not present

## 2016-06-11 DIAGNOSIS — M545 Low back pain: Secondary | ICD-10-CM

## 2016-06-11 MED ORDER — TRAMADOL HCL 50 MG PO TABS: 50.0000 mg | ORAL_TABLET | Freq: Four times a day (QID) | ORAL | 0 refills | Status: DC | PRN

## 2016-06-11 NOTE — Progress Notes (Signed)
Hematology and Oncology Follow Up Visit  [name] [name] [id number] [birth year] 76 y.o. 06/11/2016 4:20 PM   Principle Diagnosis: 76 year old man with castration resistant metastatic prostate cancer with disease to the bone. His initial diagnosis was in 2012 where he presented with metastatic disease to the rib. His PSA was 187.   Prior Therapy:  He was found to have a mass in the right axilla near the fourth rib encasing the right with some sclerosis. He underwent a right VATS procedure and resection of the third, fourth and fifth rib and reconstruction with titanium plates under the care of Dr. Arlyce Dice on November 13 2010.  He went on to receive androgen deprivation therapy under the care of Dr. Gaynelle Arabian. His PSA nadir was to 0.1 back in May of 2013 and subsequently started to rise.  He was treated with Provenge in the fall of 2014.  After his PSA went up to 178.6 and Xtandi was started in February of 2015 and discontinued in 09/2013 due to progression of disease and poor tolerance. Taxotere chemotherapy started on 10/05/2013. He is status post 16 cycles of therapy last one given in April 2016 with a PSA reduction down to 0.44.   Current therapy: Zytiga 1000 mg daily started in June 2017.   Interim History:  [name] presents today for a followup visit with his wife. Since his last visit, he reports no new major changes in his health. He is reporting some mild increase in his lower back pain. It is unclear if his pain is arthritic in nature. He has been using Tylenol although has not helped his pain that much. He have used codeine in the past which make him nauseated.  Overall clinical status have not dramatically changed. He continues to have a reasonable quality of life without any decline in his energy or performance status. His appetite continues to be stable.  He continues to take Zytiga without any major complications. He reports no excessive fatigue, nausea or other complaints. He  denied any nausea or GI distress.     He does not report any headaches or blurry vision or double vision. Has not reported any syncope or change in his mental status. He is not reporting any chest pain or shortness of breath.  Does not report any palpitation or leg edema. Does not report any cough, hemoptysis or wheezing. Does not report any frequency urgency or hesitancy. Does not report any skeletal complications. Does not report any lymphadenopathy or petechiae. Remainder of his review of systems unremarkable.  Medications: I have reviewed the patient's current medications.  Current Outpatient Prescriptions  Medication Sig Dispense Refill  . acetaminophen (TYLENOL) 325 MG tablet Take 650 mg by mouth every 6 (six) hours as needed for moderate pain.     Marland Kitchen aspirin EC 81 MG tablet Take 81 mg by mouth at bedtime.    . Calcium Citrate-Vitamin D (CITRACAL + D PO) Take 3 tablets by mouth every morning.    . cholecalciferol (VITAMIN D) 400 UNITS TABS tablet Take 1,500 Units by mouth.    . ferrous sulfate 325 (65 FE) MG tablet Take 325 mg by mouth daily with breakfast.    . ibuprofen (ADVIL,MOTRIN) 200 MG tablet Take 200 mg by mouth every 4 (four) hours as needed for moderate pain.    Marland Kitchen lidocaine-prilocaine (EMLA) cream Apply 1 application topically as needed. 30 g 2  . megestrol (MEGACE) 40 MG/ML suspension TAKE 2 TEASPOONFUL (10 ML) BY MOUTH TWICE DAILY (CASH PRICE $23.00  PER ADC) 240 mL 1  . pantoprazole (PROTONIX) 40 MG tablet Take 1 tablet (40 mg total) by mouth 2 (two) times daily before a meal. 60 tablet 5  . potassium chloride SA (K-DUR,KLOR-CON) 20 MEQ tablet TAKE 1 TABLET BY MOUTH DAILY 30 tablet 0  . prochlorperazine (COMPAZINE) 10 MG tablet Take 1 tablet (10 mg total) by mouth every 6 (six) hours as needed for nausea or vomiting. 30 tablet 0  . simvastatin (ZOCOR) 40 MG tablet Take 40 mg by mouth daily. Reported on 08/14/2015    . ZYTIGA 250 MG tablet TAKE 4 TABLETS BY MOUTH DAILY. TAKE ON AN  EMPTY STOMACH 1 HOUR BEFORE OR 2 HOURS AFTER A MEAL 120 tablet 0  . traMADol (ULTRAM) 50 MG tablet Take 1 tablet (50 mg total) by mouth every 6 (six) hours as needed. 30 tablet 0   No current facility-administered medications for this visit.      Allergies:  Allergies  Allergen Reactions  . Oxycodone Nausea And Vomiting  . Penicillins Itching and Rash    Past Medical History, Surgical history, Social history, and Family History were reviewed and updated.   Physical Exam: Blood pressure 125/78, pulse 94, temperature 98.3 F (36.8 C), temperature source Oral, resp. rate 18, height 5\' 6"  (1.676 m), weight 154 lb 8 oz (70.1 kg), SpO2 98 %. ECOG:  1 General appearance: Alert, awake gentleman appeared comfortable.. Head: Normocephalic, without obvious abnormality no oral thrush noted. Neck: no adenopathy or tenderness. Lymph nodes: Cervical, supraclavicular, and axillary nodes normal. Heart:regular rate and rhythm, S1, S2 normal, no murmur, click, rub or gallop  Chest wall examination revealed Port-A-Cath site without erythema or induration. Lung:chest clear, no wheezing, rales, normal symmetric air entry.  Abdomin: soft, non-tender, without masses or organomegaly no rebound or guarding. EXT: Trace edema noted. Neurological: No deficits noted.   Lab Results: Lab Results  Component Value Date   WBC 10.0 05/11/2016   HGB 12.3 (L) 05/11/2016   HCT 36.2 (L) 05/11/2016   MCV 98.2 (H) 05/11/2016   PLT 239 05/11/2016     Chemistry      Component Value Date/Time   NA 136 05/11/2016 1355   K 4.1 05/11/2016 1355   CL 107 03/15/2014 0553   CO2 24 05/11/2016 1355   BUN 19.4 05/11/2016 1355   CREATININE 1.0 05/11/2016 1355      Component Value Date/Time   CALCIUM 9.3 05/11/2016 1355   ALKPHOS 46 05/11/2016 1355   AST 15 05/11/2016 1355   ALT 11 05/11/2016 1355   BILITOT 0.60 05/11/2016 1355        Results for BARAA, MEADERS (MRN [id number]) as of 06/11/2016 16:23  Ref.  Range 03/25/2016 12:43 05/11/2016 13:55  PSA Latest Ref Range: 0.0 - 4.0 ng/mL 4.3 (H) 4.6 (H)      Impression and Plan:  76 year old gentleman with the following issues:  1. 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 After progressing on Xtandi, he received systemic chemotherapy in the form of Taxotere between June 2015 till April 2016 for a total of 16 cycles. His PSA dropped down to 0.44 from 176.  His PSA increased up to 6.8 in June 2017 with bony metastasis that have been relatively stable. He started on Zytiga in June 2017.  He had an excellent response to [facility] with PSA down to 2.3.   His most recent PSA was 4.6 on 05/11/2016.  I recommended continuing Zytiga with the same dose and  schedule and repeat his staging workup including a repeat bone scan in March 2018. His PSA continues to be stable without any evidence of progression on his bone scan, we'll continue to [facility]. Restarting systemic chemotherapy would be an option if he has progression of disease.    2. 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  3. Hypertension: His blood pressure have been within normal range on Zytiga.  4. IV access: Port-A-Cath will be flushed with the next visit.  5. 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  6. Weight loss: Weight remains stable at this time.  7. Bone pain: Unclear to be arthritic versus metastatic cancer to the bone. IV given prescription for tramadol for pain control and we'll repeat his bone scan before the next visit.  8. Followup: Will be in 4 weeks for a follow-up visit.   Zola Button, MD  2/22/20184:20 PM

## 2016-06-11 NOTE — Telephone Encounter (Signed)
Gave relative avs report and appointments for March. Central radiology will call re scan.

## 2016-06-12 ENCOUNTER — Telehealth: Payer: Self-pay | Admitting: Oncology

## 2016-06-12 NOTE — Telephone Encounter (Signed)
Faxed office [facility] [phone] °

## 2016-06-19 ENCOUNTER — Other Ambulatory Visit: Payer: Self-pay | Admitting: Oncology

## 2016-06-22 ENCOUNTER — Other Ambulatory Visit (INDEPENDENT_AMBULATORY_CARE_PROVIDER_SITE_OTHER): Payer: Self-pay | Admitting: Internal Medicine

## 2016-06-22 ENCOUNTER — Other Ambulatory Visit: Payer: Self-pay | Admitting: Oncology

## 2016-06-22 DIAGNOSIS — K219 Gastro-esophageal reflux disease without esophagitis: Secondary | ICD-10-CM

## 2016-06-22 DIAGNOSIS — C61 Malignant neoplasm of prostate: Secondary | ICD-10-CM

## 2016-06-29 ENCOUNTER — Other Ambulatory Visit: Payer: Self-pay | Admitting: Oncology

## 2016-06-29 MED FILL — ZYTIGA 250 MG TABLET: 250 | 30 days supply | Qty: 120 | Fill #0

## 2016-07-06 ENCOUNTER — Ambulatory Visit (HOSPITAL_BASED_OUTPATIENT_CLINIC_OR_DEPARTMENT_OTHER): Payer: Medicare Other

## 2016-07-06 ENCOUNTER — Other Ambulatory Visit (HOSPITAL_BASED_OUTPATIENT_CLINIC_OR_DEPARTMENT_OTHER): Payer: Medicare Other

## 2016-07-06 VITALS — BP 117/75 | HR 78 | Temp 98.2°F | Resp 18

## 2016-07-06 DIAGNOSIS — C61 Malignant neoplasm of prostate: Secondary | ICD-10-CM

## 2016-07-06 DIAGNOSIS — Z452 Encounter for adjustment and management of vascular access device: Secondary | ICD-10-CM | POA: Diagnosis not present

## 2016-07-06 DIAGNOSIS — C7951 Secondary malignant neoplasm of bone: Secondary | ICD-10-CM

## 2016-07-06 DIAGNOSIS — Z95828 Presence of other vascular implants and grafts: Secondary | ICD-10-CM

## 2016-07-06 LAB — CBC WITH DIFFERENTIAL/PLATELET
BASO%: 0.4 % (ref 0.0–2.0)
Basophils Absolute: 0 10*3/uL (ref 0.0–0.1)
EOS%: 1.8 % (ref 0.0–7.0)
Eosinophils Absolute: 0.2 10*3/uL (ref 0.0–0.5)
HCT: 38.5 % (ref 38.4–49.9)
HGB: 13 g/dL (ref 13.0–17.1)
LYMPH#: 3 10*3/uL (ref 0.9–3.3)
LYMPH%: 31.8 % (ref 14.0–49.0)
MCH: 32 pg (ref 27.2–33.4)
MCHC: 33.8 g/dL (ref 32.0–36.0)
MCV: 94.8 fL (ref 79.3–98.0)
MONO#: 0.7 10*3/uL (ref 0.1–0.9)
MONO%: 7.2 % (ref 0.0–14.0)
NEUT%: 58.8 % (ref 39.0–75.0)
NEUTROS ABS: 5.5 10*3/uL (ref 1.5–6.5)
PLATELETS: 238 10*3/uL (ref 140–400)
RBC: 4.06 10*6/uL — AB (ref 4.20–5.82)
RDW: 13.1 % (ref 11.0–14.6)
WBC: 9.3 10*3/uL (ref 4.0–10.3)

## 2016-07-06 LAB — COMPREHENSIVE METABOLIC PANEL
ALT: 17 U/L (ref 0–55)
ANION GAP: 7 meq/L (ref 3–11)
AST: 17 U/L (ref 5–34)
Albumin: 3.6 g/dL (ref 3.5–5.0)
Alkaline Phosphatase: 60 U/L (ref 40–150)
BILIRUBIN TOTAL: 0.71 mg/dL (ref 0.20–1.20)
BUN: 22.1 mg/dL (ref 7.0–26.0)
CHLORIDE: 104 meq/L (ref 98–109)
CO2: 24 meq/L (ref 22–29)
CREATININE: 1 mg/dL (ref 0.7–1.3)
Calcium: 9.4 mg/dL (ref 8.4–10.4)
EGFR: 78 mL/min/{1.73_m2} — ABNORMAL LOW (ref >90)
GLUCOSE: 82 mg/dL (ref 70–140)
Potassium: 4.3 mEq/L (ref 3.5–5.1)
Sodium: 135 mEq/L — ABNORMAL LOW (ref 136–145)
TOTAL PROTEIN: 6.5 g/dL (ref 6.4–8.3)

## 2016-07-06 MED ORDER — HEPARIN SOD (PORK) LOCK FLUSH 100 UNIT/ML IV SOLN
500.0000 [IU] | Freq: Once | INTRAVENOUS | Status: AC | PRN
Start: 2016-07-06 — End: 2016-07-06
  Administered 2016-07-06: 500 [IU] via INTRAVENOUS
  Filled 2016-07-06: qty 5

## 2016-07-06 MED ORDER — SODIUM CHLORIDE 0.9 % IJ SOLN
10.0000 mL | INTRAMUSCULAR | Status: DC | PRN
Start: 2016-07-06 — End: 2016-07-06
  Administered 2016-07-06: 10 mL via INTRAVENOUS
  Filled 2016-07-06: qty 10

## 2016-07-06 NOTE — Progress Notes (Signed)
1430: When deaccessing port pt c/o dizziness.  1432: VS stable patient's spouse reports that it may be because pt has not had lunch at this time, but did have a "big late breakfast". Pt offered juice or snack, pt declined, patient's wife reports they are going to eat right when they leave.  1437: Pt declines any dizziness at this time. Pt stable at discharge. Pt declines wheelchair.

## 2016-07-06 NOTE — Patient Instructions (Signed)
Implanted Port Home Guide An implanted port is a type of central line that is placed under the skin. Central lines are used to provide IV access when treatment or nutrition needs to be given through a person's veins. Implanted ports are used for long-term IV access. An implanted port may be placed because:  You need IV medicine that would be irritating to the small veins in your hands or arms.  You need long-term IV medicines, such as antibiotics.  You need IV nutrition for a long period.  You need frequent blood draws for lab tests.  You need dialysis.  Implanted ports are usually placed in the chest area, but they can also be placed in the upper arm, the abdomen, or the leg. An implanted port has two main parts:  Reservoir. The reservoir is round and will appear as a small, raised area under your skin. The reservoir is the part where a needle is inserted to give medicines or draw blood.  Catheter. The catheter is a thin, flexible tube that extends from the reservoir. The catheter is placed into a large vein. Medicine that is inserted into the reservoir goes into the catheter and then into the vein.  How will I care for my incision site? Do not get the incision site wet. Bathe or shower as directed by your health care provider. How is my port accessed? Special steps must be taken to access the port:  Before the port is accessed, a numbing cream can be placed on the skin. This helps numb the skin over the port site.  Your health care provider uses a sterile technique to access the port. ? Your health care provider must put on a mask and sterile gloves. ? The skin over your port is cleaned carefully with an antiseptic and allowed to dry. ? The port is gently pinched between sterile gloves, and a needle is inserted into the port.  Only "non-coring" port needles should be used to access the port. Once the port is accessed, a blood return should be checked. This helps ensure that the port  is in the vein and is not clogged.  If your port needs to remain accessed for a constant infusion, a clear (transparent) bandage will be placed over the needle site. The bandage and needle will need to be changed every week, or as directed by your health care provider.  Keep the bandage covering the needle clean and dry. Do not get it wet. Follow your health care provider's instructions on how to take a shower or bath while the port is accessed.  If your port does not need to stay accessed, no bandage is needed over the port.  What is flushing? Flushing helps keep the port from getting clogged. Follow your health care provider's instructions on how and when to flush the port. Ports are usually flushed with saline solution or a medicine called heparin. The need for flushing will depend on how the port is used.  If the port is used for intermittent medicines or blood draws, the port will need to be flushed: ? After medicines have been given. ? After blood has been drawn. ? As part of routine maintenance.  If a constant infusion is running, the port may not need to be flushed.  How long will my port stay implanted? The port can stay in for as long as your health care provider thinks it is needed. When it is time for the port to come out, surgery will be   done to remove it. The procedure is similar to the one performed when the port was put in. When should I seek immediate medical care? When you have an implanted port, you should seek immediate medical care if:  You notice a bad smell coming from the incision site.  You have swelling, redness, or drainage at the incision site.  You have more swelling or pain at the port site or the surrounding area.  You have a fever that is not controlled with medicine.  This information is not intended to replace advice given to you by your health care provider. Make sure you discuss any questions you have with your health care provider. Document  Released: 04/06/2005 Document Revised: 09/12/2015 Document Reviewed: 12/12/2012 Elsevier Interactive Patient Education  2017 [employer]  

## 2016-07-07 ENCOUNTER — Telehealth: Payer: Self-pay | Admitting: *Deleted

## 2016-07-07 LAB — PSA: Prostate Specific Ag, Serum: 6 ng/mL — ABNORMAL HIGH (ref 0.0–4.0)

## 2016-07-07 NOTE — Telephone Encounter (Signed)
This RN left a message on patient's phone instructing him not to cancel any appointments for tomorrow (calling for snow.) If it does snow in the morning, instructed him to call [facility] and let us know if he is not coming.

## 2016-07-08 ENCOUNTER — Ambulatory Visit (HOSPITAL_BASED_OUTPATIENT_CLINIC_OR_DEPARTMENT_OTHER): Payer: Medicare Other | Admitting: Oncology

## 2016-07-08 ENCOUNTER — Telehealth: Payer: Self-pay | Admitting: Oncology

## 2016-07-08 ENCOUNTER — Encounter (HOSPITAL_COMMUNITY)
Admission: RE | Admit: 2016-07-08 | Discharge: 2016-07-08 | Disposition: A | Discharge status: Home / Self Care | Payer: Medicare Other | Source: Ambulatory Visit | Attending: Oncology | Admitting: Oncology

## 2016-07-08 ENCOUNTER — Ambulatory Visit (HOSPITAL_COMMUNITY)
Admission: RE | Admit: 2016-07-08 | Discharge: 2016-07-08 | Disposition: A | Discharge status: Home / Self Care | Payer: Medicare Other | Source: Ambulatory Visit | Attending: Oncology | Admitting: Oncology

## 2016-07-08 VITALS — BP 114/76 | HR 79 | Resp 17 | Wt 156.4 lb

## 2016-07-08 DIAGNOSIS — M898X9 Other specified disorders of bone, unspecified site: Secondary | ICD-10-CM | POA: Diagnosis not present

## 2016-07-08 DIAGNOSIS — C61 Malignant neoplasm of prostate: Secondary | ICD-10-CM

## 2016-07-08 DIAGNOSIS — C7951 Secondary malignant neoplasm of bone: Secondary | ICD-10-CM | POA: Diagnosis not present

## 2016-07-08 DIAGNOSIS — R937 Abnormal findings on diagnostic imaging of other parts of musculoskeletal system: Secondary | ICD-10-CM | POA: Diagnosis not present

## 2016-07-08 DIAGNOSIS — I10 Essential (primary) hypertension: Secondary | ICD-10-CM | POA: Diagnosis not present

## 2016-07-08 DIAGNOSIS — E291 Testicular hypofunction: Secondary | ICD-10-CM

## 2016-07-08 MED ORDER — TECHNETIUM TC 99M MEDRONATE IV KIT: 25.0000 | PACK | Freq: Once | INTRAVENOUS | Status: DC | PRN

## 2016-07-08 NOTE — Telephone Encounter (Signed)
Gave patient AVS and calender per 3/21 los

## 2016-07-08 NOTE — Progress Notes (Signed)
Hematology and Oncology Follow Up Visit  [name] [id number] 15-Nov-1940 76 y.o. 07/08/2016 4:02 PM   Principle Diagnosis: 76 year old man with castration resistant metastatic prostate cancer with disease to the bone. His initial diagnosis was in 2012 where he presented with metastatic disease to the rib. His PSA was 187.   Prior Therapy:  He was found to have a mass in the right axilla near the fourth rib encasing the right with some sclerosis. He underwent a right VATS procedure and resection of the third, fourth and fifth rib and reconstruction with titanium plates under the care of Dr. Arlyce Dice on November 13 2010.  He went on to receive androgen deprivation therapy under the care of Dr. Gaynelle Arabian. His PSA nadir was to 0.1 back in May of 2013 and subsequently started to rise.  He was treated with Provenge in the fall of 2014.  After his PSA went up to 178.6 and Xtandi was started in February of 2015 and discontinued in 09/2013 due to progression of disease and poor tolerance. Taxotere chemotherapy started on 10/05/2013. He is status post 16 cycles of therapy last one given in April 2016 with a PSA reduction down to 0.44.   Current therapy: Zytiga 1000 mg daily started in June 2017.   Interim History:  [name] presents today for a followup visit with his wife. Since his last visit, he reports feeling better since the last visit. He reports no recent complaints with improved quality of life and performance status. He reports his back pain is much less and barely takes any pain medication at this time. He continues to take Zytiga without any major complications. He reports no excessive fatigue, nausea or other complaints. He denied any nausea or GI distress. He is not a difficulties obtaining this medication or taking it on a regular basis.     He does not report any headaches or blurry vision or double vision. Has not reported any syncope or change in his mental status. He is not  reporting any chest pain or shortness of breath.  Does not report any palpitation or leg edema. Does not report any cough, hemoptysis or wheezing. Does not report any frequency urgency or hesitancy. Does not report any skeletal complications. Does not report any lymphadenopathy or petechiae. Remainder of his review of systems unremarkable.  Medications: I have reviewed the patient's current medications.  Current Outpatient Prescriptions  Medication Sig Dispense Refill  . acetaminophen (TYLENOL) 325 MG tablet Take 650 mg by mouth every 6 (six) hours as needed for moderate pain.     Marland Kitchen aspirin EC 81 MG tablet Take 81 mg by mouth at bedtime.    . Calcium Citrate-Vitamin D (CITRACAL + D PO) Take 3 tablets by mouth every morning.    . cholecalciferol (VITAMIN D) 400 UNITS TABS tablet Take 1,500 Units by mouth.    . ferrous sulfate 325 (65 FE) MG tablet Take 325 mg by mouth daily with breakfast.    . ibuprofen (ADVIL,MOTRIN) 200 MG tablet Take 200 mg by mouth every 4 (four) hours as needed for moderate pain.    Marland Kitchen lidocaine-prilocaine (EMLA) cream Apply 1 application topically as needed. 30 g 2  . megestrol (MEGACE) 40 MG/ML suspension TAKE 2 TEASPOONFUL (10 ML) BY MOUTH TWICE DAILY (CASH PRICE $23.00 PER ADC) 240 mL 1  . pantoprazole (PROTONIX) 40 MG tablet TAKE 1 TABLET BY MOUTH 30 MINUTES BEFORE BREAKFAST AND TAKE 1 TABLET BY MOUTH 30 MINUTES BEFORE SUPPER 60 tablet 11  .  potassium chloride SA (K-DUR,KLOR-CON) 20 MEQ tablet TAKE 1 TABLET BY MOUTH DAILY 30 tablet 0  . prochlorperazine (COMPAZINE) 10 MG tablet Take 1 tablet (10 mg total) by mouth every 6 (six) hours as needed for nausea or vomiting. 30 tablet 0  . simvastatin (ZOCOR) 40 MG tablet Take 40 mg by mouth daily. Reported on 08/14/2015    . traMADol (ULTRAM) 50 MG tablet Take 1 tablet (50 mg total) by mouth every 6 (six) hours as needed. 30 tablet 0  . ZYTIGA 250 MG tablet TAKE 4 TABLETS BY MOUTH DAILY. TAKE ON AN EMPTY STOMACH 1 HOUR BEFORE OR 2  HOURS AFTER A MEAL 120 tablet 0   No current facility-administered medications for this visit.    Facility-Administered Medications Ordered in Other Visits  Medication Dose Route Frequency Provider Last Rate Last Dose  . technetium medronate (TC-MDP) injection 25 millicurie  25 millicurie Intravenous Once PRN Lorin Picket, MD         Allergies:  Allergies  Allergen Reactions  . Oxycodone Nausea And Vomiting  . Penicillins Itching and Rash    Past Medical History, Surgical history, Social history, and Family History were reviewed and updated.   Physical Exam: Blood pressure 114/76, pulse 79, resp. rate 17, weight 156 lb 6.4 oz (70.9 kg), SpO2 100 %. ECOG:  1 General appearance: Well-appearing gentleman appeared comfortable. Head: Normocephalic, without obvious abnormality no oral ulcers or lesions. Neck: no adenopathy or tenderness. Lymph nodes: Cervical, supraclavicular, and axillary nodes normal. Heart:regular rate and rhythm, S1, S2 normal, no murmur, click, rub or gallop  Chest wall examination revealed Port-A-Cath site without erythema or induration. Lung:chest clear, no wheezing, rales, normal symmetric air entry.  Abdomin: soft, non-tender, without masses or organomegaly no shifting dullness or ascites. EXT: Trace edema noted. Neurological: No deficits noted.   Lab Results: Lab Results  Component Value Date   WBC 9.3 07/06/2016   HGB 13.0 07/06/2016   HCT 38.5 07/06/2016   MCV 94.8 07/06/2016   PLT 238 07/06/2016     Chemistry      Component Value Date/Time   NA 135 (L) 07/06/2016 1349   K 4.3 07/06/2016 1349   CL 107 03/15/2014 0553   CO2 24 07/06/2016 1349   BUN 22.1 07/06/2016 1349   CREATININE 1.0 07/06/2016 1349      Component Value Date/Time   CALCIUM 9.4 07/06/2016 1349   ALKPHOS 60 07/06/2016 1349   AST 17 07/06/2016 1349   ALT 17 07/06/2016 1349   BILITOT 0.71 07/06/2016 1349     Results for [name] [name] (MRN [id number]) as of  07/08/2016 16:04  Ref. Range 03/25/2016 12:43 05/11/2016 13:55 07/06/2016 13:49  PSA Latest Ref Range: 0.0 - 4.0 ng/mL 4.3 (H) 4.6 (H) 6.0 (H)   EXAM: NUCLEAR MEDICINE WHOLE BODY BONE SCAN  TECHNIQUE: Whole body anterior and posterior images were obtained approximately 3 hours after intravenous injection of radiopharmaceutical.  RADIOPHARMACEUTICALS:  21.0 mCi Technetium-62m MDP IV  COMPARISON:  10/01/2015  FINDINGS: Abnormal uptake again noted in the proximal right humerus, similar to prior study. Abnormal uptake in the right ischium is stable. New area of abnormal uptake in the lower thoracic spine, likely T10.  IMPRESSION: Stable abnormal uptake in the right proximal humerus and right ischium. New abnormal uptake in the lower thoracic spine, likely T10 level. This could be related to metastasis, pathologic fracture or insufficiency fracture. Recommend correlation with thoracic spine plain films.     Impression and Plan:  76 year old  gentleman with the following issues:  1. 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 After progressing on Xtandi, he received systemic chemotherapy in the form of Taxotere between June 2015 till April 2016 for a total of 16 cycles. His PSA dropped down to 0.44 from 176.  His PSA increased up to 6.8 in June 2017 with bony metastasis that have been relatively stable. He started on Zytiga in June 2017.  He continues to tolerate this medication reasonably well with improvement in his quality of life and performance status. He does not report any worsening pain and his overall health remains stable. His PSA has slightly increased to 6.0. Bone scan obtained on 07/08/2016 was reviewed and discussed with the patient. Given the stability of his imaging studies and clinical status, I recommended continuing Zytiga for the time being. If his PSA starts to rise more rapidly, or changes in his clinical status as noted,  different salvage therapy will be needed.    2. 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 He is taking less Tylenol since the last visit.  3. Hypertension: His blood pressure have been within normal range on Zytiga.  4. IV access: Port-A-Cath will be flushed with the next visit.  5. Androgen depravation: His testosterone level in October 2017 was less than 3. This will be repeated in the future to ensure castration levels.  6. Weight loss: Weight is improved since the last visit  7. Followup: Will be in 6 weeks for a follow-up visit.   Zola Button, MD  3/21/20184:02 PM

## 2016-07-19 DIAGNOSIS — I471 Supraventricular tachycardia, unspecified: Secondary | ICD-10-CM | POA: Diagnosis present

## 2016-07-19 HISTORY — DX: Supraventricular tachycardia: I47.1

## 2016-07-19 HISTORY — DX: Supraventricular tachycardia, unspecified: I47.10

## 2016-07-21 ENCOUNTER — Other Ambulatory Visit: Payer: Self-pay | Admitting: Oncology

## 2016-07-29 ENCOUNTER — Other Ambulatory Visit: Payer: Self-pay | Admitting: Oncology

## 2016-07-29 MED FILL — ZYTIGA 250 MG TABLET: 250 | 30 days supply | Qty: 120 | Fill #0

## 2016-08-10 ENCOUNTER — Emergency Department (HOSPITAL_COMMUNITY): Payer: Medicare Other

## 2016-08-10 ENCOUNTER — Inpatient Hospital Stay (HOSPITAL_COMMUNITY)
Admission: EM | Admit: 2016-08-10 | Discharge: 2016-08-14 | DRG: 308 | Disposition: A | Discharge status: Skilled Nursing Facility | Payer: Medicare Other | Attending: Internal Medicine | Admitting: Internal Medicine

## 2016-08-10 DIAGNOSIS — E871 Hypo-osmolality and hyponatremia: Secondary | ICD-10-CM | POA: Diagnosis present

## 2016-08-10 DIAGNOSIS — Z880 Allergy status to penicillin: Secondary | ICD-10-CM

## 2016-08-10 DIAGNOSIS — Z87891 Personal history of nicotine dependence: Secondary | ICD-10-CM

## 2016-08-10 DIAGNOSIS — I471 Supraventricular tachycardia, unspecified: Secondary | ICD-10-CM | POA: Diagnosis present

## 2016-08-10 DIAGNOSIS — I10 Essential (primary) hypertension: Secondary | ICD-10-CM | POA: Diagnosis present

## 2016-08-10 DIAGNOSIS — I452 Bifascicular block: Secondary | ICD-10-CM | POA: Diagnosis present

## 2016-08-10 DIAGNOSIS — Z8719 Personal history of other diseases of the digestive system: Secondary | ICD-10-CM

## 2016-08-10 DIAGNOSIS — Z7982 Long term (current) use of aspirin: Secondary | ICD-10-CM

## 2016-08-10 DIAGNOSIS — C7951 Secondary malignant neoplasm of bone: Secondary | ICD-10-CM | POA: Diagnosis present

## 2016-08-10 DIAGNOSIS — G9340 Encephalopathy, unspecified: Secondary | ICD-10-CM | POA: Diagnosis present

## 2016-08-10 DIAGNOSIS — Z66 Do not resuscitate: Secondary | ICD-10-CM | POA: Diagnosis present

## 2016-08-10 DIAGNOSIS — I4430 Unspecified atrioventricular block: Secondary | ICD-10-CM | POA: Diagnosis present

## 2016-08-10 DIAGNOSIS — K219 Gastro-esophageal reflux disease without esophagitis: Secondary | ICD-10-CM | POA: Diagnosis present

## 2016-08-10 DIAGNOSIS — J189 Pneumonia, unspecified organism: Secondary | ICD-10-CM

## 2016-08-10 DIAGNOSIS — E785 Hyperlipidemia, unspecified: Secondary | ICD-10-CM | POA: Diagnosis present

## 2016-08-10 DIAGNOSIS — G9341 Metabolic encephalopathy: Secondary | ICD-10-CM | POA: Diagnosis present

## 2016-08-10 DIAGNOSIS — J181 Lobar pneumonia, unspecified organism: Secondary | ICD-10-CM

## 2016-08-10 DIAGNOSIS — R4182 Altered mental status, unspecified: Secondary | ICD-10-CM

## 2016-08-10 DIAGNOSIS — F0390 Unspecified dementia without behavioral disturbance: Secondary | ICD-10-CM | POA: Diagnosis present

## 2016-08-10 DIAGNOSIS — I440 Atrioventricular block, first degree: Secondary | ICD-10-CM | POA: Diagnosis present

## 2016-08-10 DIAGNOSIS — Z95828 Presence of other vascular implants and grafts: Secondary | ICD-10-CM

## 2016-08-10 DIAGNOSIS — C61 Malignant neoplasm of prostate: Secondary | ICD-10-CM | POA: Diagnosis present

## 2016-08-10 HISTORY — DX: Unspecified atrioventricular block: I44.30

## 2016-08-10 HISTORY — DX: Ulcer of esophagus without bleeding: K22.10

## 2016-08-10 HISTORY — DX: Bifascicular block: I45.2

## 2016-08-10 HISTORY — DX: Personal history of other diseases of the digestive system: Z87.19

## 2016-08-10 HISTORY — DX: Supraventricular tachycardia: I47.1

## 2016-08-10 LAB — I-STAT CHEM 8, ED
BUN: 21 mg/dL — ABNORMAL HIGH (ref 6–20)
CALCIUM ION: 1.18 mmol/L (ref 1.15–1.40)
CREATININE: 1.1 mg/dL (ref 0.61–1.24)
Chloride: 104 mmol/L (ref 101–111)
GLUCOSE: 109 mg/dL — AB (ref 65–99)
HCT: 41 % (ref 39.0–52.0)
HEMOGLOBIN: 13.9 g/dL (ref 13.0–17.0)
Potassium: 3.7 mmol/L (ref 3.5–5.1)
Sodium: 139 mmol/L (ref 135–145)
TCO2: 24 mmol/L (ref 0–100)

## 2016-08-10 LAB — CBC WITH DIFFERENTIAL/PLATELET
Basophils Absolute: 0.1 10*3/uL (ref 0.0–0.1)
Basophils Relative: 0 %
EOS ABS: 0.3 10*3/uL (ref 0.0–0.7)
Eosinophils Relative: 2 %
HCT: 40.4 % (ref 39.0–52.0)
HEMOGLOBIN: 13.6 g/dL (ref 13.0–17.0)
LYMPHS ABS: 3.3 10*3/uL (ref 0.7–4.0)
Lymphocytes Relative: 29 %
MCH: 31.2 pg (ref 26.0–34.0)
MCHC: 33.7 g/dL (ref 30.0–36.0)
MCV: 92.7 fL (ref 78.0–100.0)
MONO ABS: 0.9 10*3/uL (ref 0.1–1.0)
MONOS PCT: 8 %
NEUTROS PCT: 61 %
Neutro Abs: 6.8 10*3/uL (ref 1.7–7.7)
Platelets: 237 10*3/uL (ref 150–400)
RBC: 4.36 MIL/uL (ref 4.22–5.81)
RDW: 13.5 % (ref 11.5–15.5)
WBC: 11.2 10*3/uL — ABNORMAL HIGH (ref 4.0–10.5)

## 2016-08-10 LAB — I-STAT TROPONIN, ED: Troponin i, poc: 0 ng/mL (ref 0.00–0.08)

## 2016-08-10 LAB — PROTIME-INR
INR: 0.99
PROTHROMBIN TIME: 13.1 s (ref 11.4–15.2)

## 2016-08-10 LAB — I-STAT CG4 LACTIC ACID, ED: Lactic Acid, Venous: 1.04 mmol/L (ref 0.5–1.9)

## 2016-08-10 LAB — CBG MONITORING, ED: Glucose-Capillary: 125 mg/dL — ABNORMAL HIGH (ref 65–99)

## 2016-08-10 MED ORDER — DEXTROSE 5 % IV SOLN
2.0000 g | Freq: Once | INTRAVENOUS | Status: AC
  Administered 2016-08-10: 2 g via INTRAVENOUS
  Filled 2016-08-10: qty 2

## 2016-08-10 MED ORDER — ADENOSINE 6 MG/2ML IV SOLN
6.0000 mg | Freq: Once | INTRAVENOUS | Status: DC
  Filled 2016-08-10: qty 2

## 2016-08-10 MED ORDER — SODIUM CHLORIDE 0.9 % IV BOLUS (SEPSIS)
2000.0000 mL | Freq: Once | INTRAVENOUS | Status: AC
  Administered 2016-08-10: 2000 mL via INTRAVENOUS

## 2016-08-10 MED ORDER — VANCOMYCIN HCL IN DEXTROSE 1-5 GM/200ML-% IV SOLN
1000.0000 mg | Freq: Once | INTRAVENOUS | Status: AC
  Administered 2016-08-11: 1000 mg via INTRAVENOUS
  Filled 2016-08-10: qty 200

## 2016-08-10 NOTE — ED Provider Notes (Signed)
[facility] DEPT Provider Note   CSN: [id number] Arrival date & time: 08/10/16  2134     History   Chief Complaint Chief Complaint  Patient presents with  . Altered Mental Status    HPI [name] [name] is a 76 y.o. male PMH of dementia and tachycardia, presenting today with AMS.  History obtained from daughter who he lives with. She states today he became altered seemingly out of nowehere.  It is worse than his baseline dementia. He had repetitive questioning and inability to follow simple commands which is unusual for him. She denies any recent infections, fevers, falls, or sick contacts.  She has no further complaints.  10 Systems reviewed and are negative for acute change except as noted in the HPI.   HPI  Past Medical History:  Diagnosis Date  . Anxiety   . Fatigue   . GERD (gastroesophageal reflux disease)   . Hyperlipidemia   . Hypertension    denies, has had hypotension  . Metastatic bone tumor ([place]) 11/13/2010   RT VAT , RESECTION OF 3rd,4th ,5th ribs with reconstruction with allograft and titanium platesDR. BURNEY  . Prostate carcinoma ([place])   . Tachycardia    history of    Patient Active Problem List   Diagnosis Date Noted  . Port catheter in place 08/13/2015  . Upper GI bleed 03/14/2014  . Acute blood loss anemia 03/14/2014  . Prostate carcinoma ([place])   . Hyperlipidemia   . Hypertension   . Metastatic bone tumor ([place]) 11/13/2010    Past Surgical History:  Procedure Laterality Date  . DENTAL SURGERY    . ESOPHAGOGASTRODUODENOSCOPY N/A 03/14/2014   Procedure: ESOPHAGOGASTRODUODENOSCOPY (EGD);  Surgeon: Rogene Houston, MD;  Location: [facility];  Service: Endoscopy;  Laterality: N/A;  . RESECTION BONE TUMOR RIB  [id number]   RT VAT , RESECTION OF 3rd,4th ,5th ribs with reconstruction with allograft and titanium platesDR. BURNEY       Home Medications    Prior to Admission medications   Medication Sig Start Date End Date Taking? Authorizing  Provider  acetaminophen (TYLENOL) 325 MG tablet Take 650 mg by mouth every 6 (six) hours as needed for moderate pain.     Historical Provider, MD  aspirin EC 81 MG tablet Take 81 mg by mouth at bedtime.    Historical Provider, MD  Calcium Citrate-Vitamin D (CITRACAL + D PO) Take 3 tablets by mouth every morning.    Historical Provider, MD  cholecalciferol (VITAMIN D) 400 UNITS TABS tablet Take 1,500 Units by mouth.    Historical Provider, MD  ferrous sulfate 325 (65 FE) MG tablet Take 325 mg by mouth daily with breakfast.    Historical Provider, MD  ibuprofen (ADVIL,MOTRIN) 200 MG tablet Take 200 mg by mouth every 4 (four) hours as needed for moderate pain. 11/13/14   Historical Provider, MD  lidocaine-prilocaine (EMLA) cream Apply 1 application topically as needed. 03/25/16   Wyatt Portela, MD  megestrol (MEGACE) 40 MG/ML suspension TAKE 2 TEASPOONFUL (10 ML) BY MOUTH TWICE DAILY (CASH PRICE $23.00 PER ADC) 06/22/16   Wyatt Portela, MD  pantoprazole (PROTONIX) 40 MG tablet TAKE 1 TABLET BY MOUTH 30 MINUTES BEFORE BREAKFAST AND TAKE 1 TABLET BY MOUTH 30 MINUTES BEFORE SUPPER 06/22/16   Butch Penny, NP  potassium chloride SA (K-DUR,KLOR-CON) 20 MEQ tablet TAKE 1 TABLET BY MOUTH DAILY 07/22/16   Wyatt Portela, MD  prochlorperazine (COMPAZINE) 10 MG tablet Take 1 tablet (10 mg total)  by mouth every 6 (six) hours as needed for nausea or vomiting. 09/20/13   Wyatt Portela, MD  simvastatin (ZOCOR) 40 MG tablet Take 40 mg by mouth daily. Reported on 08/14/2015    Historical Provider, MD  traMADol (ULTRAM) 50 MG tablet Take 1 tablet (50 mg total) by mouth every 6 (six) hours as needed. 06/11/16   Wyatt Portela, MD  ZYTIGA 250 MG tablet TAKE 4 TABLETS BY MOUTH DAILY. TAKE ON AN EMPTY STOMACH 1 HOUR BEFORE OR 2 HOURS AFTER A MEAL 07/29/16   Wyatt Portela, MD    Family History Family History  Problem Relation Age of Onset  . Aneurysm Father     Social History Social History  Substance Use Topics  .  Smoking status: Former Smoker    Packs/day: 2.00    Years: 15.00    Types: Cigarettes    Quit date: 04/20/1974  . Smokeless tobacco: Never Used  . Alcohol use No     Allergies   Oxycodone and Penicillins   Review of Systems Review of Systems   Physical Exam Updated Vital Signs BP 104/72 (BP Location: Left Arm)   Pulse 86   Temp 98.5 F (36.9 C) (Oral)   Resp 17   Ht 5' (1.524 m)   Wt 140 lb (63.5 kg)   SpO2 97%   BMI 27.34 kg/m   Physical Exam  Constitutional: Vital signs are normal. He appears well-developed.  Non-toxic appearance. He does not appear ill. No distress.  HENT:  Head: Normocephalic and atraumatic.  Nose: Nose normal.  Mouth/Throat: Oropharynx is clear and moist. No oropharyngeal exudate.  Eyes: Conjunctivae and EOM are normal. Pupils are equal, round, and reactive to light. No scleral icterus.  Neck: Normal range of motion. Neck supple. No tracheal deviation, no edema, no erythema and normal range of motion present. No thyroid mass and no thyromegaly present.  Cardiovascular: Regular rhythm, S1 normal, S2 normal, normal heart sounds, intact distal pulses and normal pulses.  Exam reveals no gallop and no friction rub.   No murmur heard. tachycardia  Pulmonary/Chest: Effort normal and breath sounds normal. No respiratory distress. He has no wheezes. He has no rhonchi. He has no rales.  Abdominal: Soft. Normal appearance and bowel sounds are normal. He exhibits no distension, no ascites and no mass. There is no hepatosplenomegaly. There is no tenderness. There is no rebound, no guarding and no CVA tenderness.  Musculoskeletal: Normal range of motion. He exhibits edema. He exhibits no tenderness.  Lymphadenopathy:    He has no cervical adenopathy.  Neurological: He is alert. He has normal strength. No cranial nerve deficit or sensory deficit.  Skin: Skin is warm, dry and intact. No petechiae and no rash noted. He is not diaphoretic. No erythema. No pallor.    Nursing note and vitals reviewed.    ED Treatments / Results  Labs (all labs ordered are listed, but only abnormal results are displayed) Labs Reviewed  COMPREHENSIVE METABOLIC PANEL - Abnormal; Notable for the following:       Result Value   Glucose, Bld 112 (*)    Calcium 8.7 (*)    Total Protein 6.4 (*)    ALT 15 (*)    All other components within normal limits  CBC WITH DIFFERENTIAL/PLATELET - Abnormal; Notable for the following:    WBC 11.2 (*)    All other components within normal limits  CBG MONITORING, ED - Abnormal; Notable for the following:    Glucose-Capillary  125 (*)    All other components within normal limits  I-STAT CHEM 8, ED - Abnormal; Notable for the following:    BUN 21 (*)    Glucose, Bld 109 (*)    All other components within normal limits  URINE CULTURE  CULTURE, BLOOD (ROUTINE X 2)  CULTURE, BLOOD (ROUTINE X 2)  PROTIME-INR  ETHANOL  MAGNESIUM  RAPID URINE DRUG SCREEN, HOSP PERFORMED  URINALYSIS, ROUTINE W REFLEX MICROSCOPIC  I-STAT CG4 LACTIC ACID, ED  I-STAT TROPOININ, ED    EKG  EKG Interpretation  Date/Time:  Monday August 10 2016 23:43:44 EDT Ventricular Rate:  84 PR Interval:    QRS Duration: 115 QT Interval:  360 QTC Calculation: 426 R Axis:   -45 Text Interpretation:  Sinus rhythm Prolonged PR interval Left anterior fascicular block Low voltage, precordial leads Consider anterior infarct SVT resolved Confirmed by Glynn Octave [phone]) on 08/10/2016 11:54:54 PM       Radiology Dg Chest 2 View  Result Date: 08/10/2016 CLINICAL DATA:  Acute onset of shortness of breath and altered mental status. Initial encounter. EXAM: CHEST  2 VIEW COMPARISON:  Chest radiograph performed 03/01/2014, and CT of the chest performed 10/01/2015 FINDINGS: The lungs are hypoexpanded. Right midlung airspace opacity raises concern for pneumonia. There is no evidence of pleural effusion or pneumothorax. The heart is mildly enlarged. No acute osseous  abnormalities are seen. Right rib hardware is noted. A right-sided chest port is noted ending about the proximal right atrium. IMPRESSION: 1. Lungs hypoexpanded. Right midlung airspace opacity raises concern for pneumonia. 2. Mild cardiomegaly. Electronically Signed   By: Garald Balding M.D.   On: 08/10/2016 23:50    Procedures Procedures (including critical care time)  Medications Ordered in ED Medications  adenosine (ADENOCARD) 6 MG/2ML injection 6 mg (6 mg Intravenous Not Given 08/10/16 2347)  vancomycin (VANCOCIN) IVPB 1000 mg/200 mL premix (not administered)  sodium chloride 0.9 % bolus 2,000 mL (2,000 mLs Intravenous New Bag/Given 08/10/16 2338)  ceFEPIme (MAXIPIME) 2 g in dextrose 5 % 50 mL IVPB (2 g Intravenous New Bag/Given 08/10/16 2345)     Initial Impression / Assessment and Plan / ED Course  I have reviewed the triage vital signs and the nursing notes.  Pertinent labs & imaging results that were available during my care of the patient were reviewed by me and considered in my medical decision making (see chart for details).     Patient presents to the ED for AMS with HR in the 160s.  Daughter states he has a history of tachcyardia, but EKG reveals SVT.  Will order adenosine.  Also code sepsis was called and abx ordered. CT head pending.  BP is soft at 100/78.  Will continue to closely monitor.  Patient spontaneously converted from SVT into NSR with IVF and abx.  CXR reveals RML PNA.  WIll admit for further care.  Final Clinical Impressions(s) / ED Diagnoses   Final diagnoses:  None    New Prescriptions New Prescriptions   No medications on file     Everlene Balls, MD 08/11/16 1696

## 2016-08-10 NOTE — ED Notes (Signed)
Dr.Oni notified of decrease in heart rate from 153 to 86. Repeat EKG performed per verbal order by Dr.Oni

## 2016-08-10 NOTE — ED Triage Notes (Signed)
Per wife, pt has been having increased confusion, mumbling words, disoriented. Pt has hx of dementia, but is normally more oriented and clear in speech.

## 2016-08-11 ENCOUNTER — Ambulatory Visit (INDEPENDENT_AMBULATORY_CARE_PROVIDER_SITE_OTHER): Payer: Medicare Other | Admitting: Internal Medicine

## 2016-08-11 ENCOUNTER — Encounter (HOSPITAL_COMMUNITY): Payer: Self-pay | Admitting: Internal Medicine

## 2016-08-11 DIAGNOSIS — J189 Pneumonia, unspecified organism: Secondary | ICD-10-CM

## 2016-08-11 DIAGNOSIS — C61 Malignant neoplasm of prostate: Secondary | ICD-10-CM | POA: Diagnosis present

## 2016-08-11 DIAGNOSIS — G9340 Encephalopathy, unspecified: Secondary | ICD-10-CM | POA: Diagnosis not present

## 2016-08-11 DIAGNOSIS — F0390 Unspecified dementia without behavioral disturbance: Secondary | ICD-10-CM | POA: Diagnosis present

## 2016-08-11 DIAGNOSIS — E871 Hypo-osmolality and hyponatremia: Secondary | ICD-10-CM | POA: Diagnosis present

## 2016-08-11 DIAGNOSIS — C7951 Secondary malignant neoplasm of bone: Secondary | ICD-10-CM | POA: Diagnosis present

## 2016-08-11 DIAGNOSIS — E785 Hyperlipidemia, unspecified: Secondary | ICD-10-CM | POA: Diagnosis present

## 2016-08-11 DIAGNOSIS — G9341 Metabolic encephalopathy: Secondary | ICD-10-CM | POA: Diagnosis present

## 2016-08-11 DIAGNOSIS — I10 Essential (primary) hypertension: Secondary | ICD-10-CM

## 2016-08-11 DIAGNOSIS — K219 Gastro-esophageal reflux disease without esophagitis: Secondary | ICD-10-CM | POA: Diagnosis present

## 2016-08-11 DIAGNOSIS — Z7982 Long term (current) use of aspirin: Secondary | ICD-10-CM | POA: Diagnosis not present

## 2016-08-11 DIAGNOSIS — R4182 Altered mental status, unspecified: Secondary | ICD-10-CM | POA: Diagnosis present

## 2016-08-11 DIAGNOSIS — Z8719 Personal history of other diseases of the digestive system: Secondary | ICD-10-CM | POA: Diagnosis not present

## 2016-08-11 DIAGNOSIS — Z87891 Personal history of nicotine dependence: Secondary | ICD-10-CM | POA: Diagnosis not present

## 2016-08-11 DIAGNOSIS — Z880 Allergy status to penicillin: Secondary | ICD-10-CM | POA: Diagnosis not present

## 2016-08-11 DIAGNOSIS — I4430 Unspecified atrioventricular block: Secondary | ICD-10-CM | POA: Diagnosis not present

## 2016-08-11 DIAGNOSIS — I471 Supraventricular tachycardia: Secondary | ICD-10-CM | POA: Diagnosis present

## 2016-08-11 DIAGNOSIS — Z66 Do not resuscitate: Secondary | ICD-10-CM | POA: Diagnosis present

## 2016-08-11 DIAGNOSIS — I440 Atrioventricular block, first degree: Secondary | ICD-10-CM | POA: Diagnosis present

## 2016-08-11 DIAGNOSIS — I452 Bifascicular block: Secondary | ICD-10-CM | POA: Diagnosis not present

## 2016-08-11 LAB — COMPREHENSIVE METABOLIC PANEL
ALBUMIN: 3.5 g/dL (ref 3.5–5.0)
ALK PHOS: 48 U/L (ref 38–126)
ALK PHOS: 39 U/L (ref 38–126)
ALT: 15 U/L — ABNORMAL LOW (ref 17–63)
ALT: 13 U/L — AB (ref 17–63)
ANION GAP: 6 (ref 5–15)
AST: 22 U/L (ref 15–41)
AST: 17 U/L (ref 15–41)
Albumin: 3 g/dL — ABNORMAL LOW (ref 3.5–5.0)
Anion gap: 6 (ref 5–15)
BILIRUBIN TOTAL: 0.6 mg/dL (ref 0.3–1.2)
BUN: 19 mg/dL (ref 6–20)
BUN: 16 mg/dL (ref 6–20)
CALCIUM: 8.1 mg/dL — AB (ref 8.9–10.3)
CO2: 23 mmol/L (ref 22–32)
CO2: 23 mmol/L (ref 22–32)
CREATININE: 0.8 mg/dL (ref 0.61–1.24)
Calcium: 8.7 mg/dL — ABNORMAL LOW (ref 8.9–10.3)
Chloride: 107 mmol/L (ref 101–111)
Chloride: 108 mmol/L (ref 101–111)
Creatinine, Ser: 1.08 mg/dL (ref 0.61–1.24)
GFR calc Af Amer: >60 mL/min (ref >60)
GFR calc non Af Amer: >60 mL/min (ref >60)
Glucose, Bld: 112 mg/dL — ABNORMAL HIGH (ref 65–99)
Glucose, Bld: 99 mg/dL (ref 65–99)
Potassium: 3.7 mmol/L (ref 3.5–5.1)
Potassium: 3.6 mmol/L (ref 3.5–5.1)
SODIUM: 137 mmol/L (ref 135–145)
Sodium: 136 mmol/L (ref 135–145)
TOTAL PROTEIN: 5.4 g/dL — AB (ref 6.5–8.1)
Total Bilirubin: 0.7 mg/dL (ref 0.3–1.2)
Total Protein: 6.4 g/dL — ABNORMAL LOW (ref 6.5–8.1)

## 2016-08-11 LAB — PROCALCITONIN: PROCALCITONIN: 0.1 ng/mL

## 2016-08-11 LAB — RAPID URINE DRUG SCREEN, HOSP PERFORMED
AMPHETAMINES: NOT DETECTED
BARBITURATES: NOT DETECTED
BENZODIAZEPINES: NOT DETECTED
COCAINE: NOT DETECTED
Opiates: NOT DETECTED
Tetrahydrocannabinol: POSITIVE — AB

## 2016-08-11 LAB — CBC WITH DIFFERENTIAL/PLATELET
BASOS ABS: 0.1 10*3/uL (ref 0.0–0.1)
BASOS PCT: 1 %
EOS ABS: 0.3 10*3/uL (ref 0.0–0.7)
Eosinophils Relative: 3 %
HEMATOCRIT: 34.1 % — AB (ref 39.0–52.0)
HEMOGLOBIN: 11.4 g/dL — AB (ref 13.0–17.0)
Lymphocytes Relative: 31 %
Lymphs Abs: 2.6 10*3/uL (ref 0.7–4.0)
MCH: 30.9 pg (ref 26.0–34.0)
MCHC: 33.4 g/dL (ref 30.0–36.0)
MCV: 92.4 fL (ref 78.0–100.0)
Monocytes Absolute: 0.6 10*3/uL (ref 0.1–1.0)
Monocytes Relative: 7 %
NEUTROS ABS: 5 10*3/uL (ref 1.7–7.7)
NEUTROS PCT: 58 %
Platelets: 204 10*3/uL (ref 150–400)
RBC: 3.69 MIL/uL — ABNORMAL LOW (ref 4.22–5.81)
RDW: 13.6 % (ref 11.5–15.5)
WBC: 8.5 10*3/uL (ref 4.0–10.5)

## 2016-08-11 LAB — ETHANOL

## 2016-08-11 LAB — URINALYSIS, ROUTINE W REFLEX MICROSCOPIC
Bilirubin Urine: NEGATIVE
GLUCOSE, UA: NEGATIVE mg/dL
Hgb urine dipstick: NEGATIVE
Ketones, ur: NEGATIVE mg/dL
Leukocytes, UA: NEGATIVE
Nitrite: NEGATIVE
PH: 5 (ref 5.0–8.0)
Protein, ur: NEGATIVE mg/dL
SPECIFIC GRAVITY, URINE: 1.019 (ref 1.005–1.030)

## 2016-08-11 LAB — MAGNESIUM: Magnesium: 1.9 mg/dL (ref 1.7–2.4)

## 2016-08-11 LAB — TSH: TSH: 1.879 u[IU]/mL (ref 0.350–4.500)

## 2016-08-11 LAB — LACTIC ACID, PLASMA: LACTIC ACID, VENOUS: 0.7 mmol/L (ref 0.5–1.9)

## 2016-08-11 LAB — TROPONIN I

## 2016-08-11 LAB — INFLUENZA PANEL BY PCR (TYPE A & B)
Influenza A By PCR: NEGATIVE
Influenza B By PCR: NEGATIVE

## 2016-08-11 MED ORDER — METOPROLOL TARTRATE 5 MG/5ML IV SOLN
5.0000 mg | Freq: Once | INTRAVENOUS | Status: AC
  Administered 2016-08-11: 5 mg via INTRAVENOUS
  Filled 2016-08-11: qty 5

## 2016-08-11 MED ORDER — SIMVASTATIN 40 MG PO TABS
40.0000 mg | ORAL_TABLET | Freq: Every evening | ORAL | Status: DC
  Administered 2016-08-11 – 2016-08-13 (×3): 40 mg via ORAL
  Filled 2016-08-11 (×3): qty 1

## 2016-08-11 MED ORDER — METOPROLOL TARTRATE 5 MG/5ML IV SOLN: 5.0000 mg | Freq: Four times a day (QID) | INTRAVENOUS | Status: DC | PRN

## 2016-08-11 MED ORDER — FERROUS SULFATE 325 (65 FE) MG PO TABS
325.0000 mg | ORAL_TABLET | Freq: Every day | ORAL | Status: DC
  Administered 2016-08-11 – 2016-08-14 (×4): 325 mg via ORAL
  Filled 2016-08-11 (×4): qty 1

## 2016-08-11 MED ORDER — ACETAMINOPHEN 650 MG RE SUPP: 650.0000 mg | Freq: Four times a day (QID) | RECTAL | Status: DC | PRN

## 2016-08-11 MED ORDER — SODIUM CHLORIDE 0.9 % IV SOLN
INTRAVENOUS | Status: DC
  Administered 2016-08-11: 05:00:00 via INTRAVENOUS

## 2016-08-11 MED ORDER — ACETAMINOPHEN 325 MG PO TABS
650.0000 mg | ORAL_TABLET | Freq: Four times a day (QID) | ORAL | Status: DC | PRN
  Administered 2016-08-11 – 2016-08-14 (×6): 650 mg via ORAL
  Filled 2016-08-11 (×6): qty 2

## 2016-08-11 MED ORDER — ENOXAPARIN SODIUM 40 MG/0.4ML ~~LOC~~ SOLN
40.0000 mg | SUBCUTANEOUS | Status: DC
  Administered 2016-08-11 – 2016-08-14 (×4): 40 mg via SUBCUTANEOUS
  Filled 2016-08-11 (×4): qty 0.4

## 2016-08-11 MED ORDER — ASPIRIN EC 81 MG PO TBEC
81.0000 mg | DELAYED_RELEASE_TABLET | Freq: Every day | ORAL | Status: DC
  Administered 2016-08-11 – 2016-08-14 (×4): 81 mg via ORAL
  Filled 2016-08-11 (×4): qty 1

## 2016-08-11 MED ORDER — SODIUM CHLORIDE 0.9% FLUSH
10.0000 mL | INTRAVENOUS | Status: DC | PRN
  Administered 2016-08-13 – 2016-08-14 (×3): 10 mL
  Filled 2016-08-11 (×3): qty 40

## 2016-08-11 MED ORDER — ASPIRIN EC 81 MG PO TBEC: 81.0000 mg | DELAYED_RELEASE_TABLET | ORAL | Status: DC

## 2016-08-11 NOTE — Progress Notes (Signed)
Pt had a run of SVT per CMT and confirm ECG reading rate in 140-150, which lasted appropriately 45-50 mins, pt VSS 122/82 153 98% on 2l. MD notified, lopressor 5mg  given as ordered, pt HR decreased to 80's. SRP,RN

## 2016-08-11 NOTE — Progress Notes (Signed)
Pt HR currently SR in 70-80's not acute distress. VSs wife at bedside with pt.

## 2016-08-11 NOTE — Evaluation (Signed)
Clinical/Bedside Swallow Evaluation Patient Details  Name: [name] [name] MRN: [id number] Date of Birth: 25-Jan-1941  Today's Date: 08/11/2016 Time: SLP Start Time (ACUTE ONLY): 0946 SLP Stop Time (ACUTE ONLY): 1001 SLP Time Calculation (min) (ACUTE ONLY): 15 min  Past Medical History:  Past Medical History:  Diagnosis Date  . Anxiety   . Fatigue   . GERD (gastroesophageal reflux disease)   . Hyperlipidemia   . Hypertension    denies, has had hypotension  . Metastatic bone tumor ([place]) 11/13/2010   RT VAT , RESECTION OF 3rd,4th ,5th ribs with reconstruction with allograft and titanium platesDR. BURNEY  . Prostate carcinoma ([place])   . Tachycardia    history of   Past Surgical History:  Past Surgical History:  Procedure Laterality Date  . DENTAL SURGERY    . ESOPHAGOGASTRODUODENOSCOPY N/A 03/14/2014   Procedure: ESOPHAGOGASTRODUODENOSCOPY (EGD);  Surgeon: Rogene Houston, MD;  Location: [facility];  Service: Endoscopy;  Laterality: N/A;  . RESECTION BONE TUMOR RIB  [id number]   RT VAT , RESECTION OF 3rd,4th ,5th ribs with reconstruction with allograft and titanium platesDR. BURNEY   HPI:  [name] [name] a 76 y.o.malewith known history of advanced dementia, metastatic prostate cancer, Barrett's esophagus, hyperlipidemia was brought to the ER to patient's wife found that patient was getting increasingly confused. MD reports acute encephalopathy most likely from pneumonia.    Assessment / Plan / Recommendation Clinical Impression  Pt demonstrates no signs of aspiration, normal swallow function. Recommend pt continue current diet with basic aspiration precautions. No SLP f/u needed.       Aspiration Risk       Diet Recommendation Regular;Thin liquid   Liquid Administration via: Cup;Straw Medication Administration: Whole meds with liquid Supervision: Patient able to self feed Compensations: Minimize environmental distractions Postural Changes: Seated upright at 90  degrees;Remain upright for at least 30 minutes after po intake    Other  Recommendations Oral Care Recommendations: Oral care BID   Follow up Recommendations None      Frequency and Duration            Prognosis        Swallow Study   General HPI: [name] [name] a 76 y.o.malewith known history of advanced dementia, metastatic prostate cancer, Barrett's esophagus, hyperlipidemia was brought to the ER to patient's wife found that patient was getting increasingly confused. MD reports acute encephalopathy most likely from pneumonia.  Type of Study: Bedside Swallow Evaluation Previous Swallow Assessment: none Diet Prior to this Study: Regular;Thin liquids Temperature Spikes Noted: No Respiratory Status: Room air;Nasal cannula History of Recent Intubation: No Behavior/Cognition: Alert;Cooperative;Confused Oral Cavity Assessment: Within Functional Limits Oral Care Completed by SLP: No Oral Cavity - Dentition: Dentures, top;Dentures, bottom Vision: Functional for self-feeding Self-Feeding Abilities: Able to feed self Patient Positioning: Upright in bed Baseline Vocal Quality: Normal Volitional Cough: Strong Volitional Swallow: Able to elicit    Oral/Motor/Sensory Function Overall Oral Motor/Sensory Function: Within functional limits   Ice Chips     Thin Liquid Thin Liquid: Within functional limits Presentation: Cup;Straw    Nectar Thick Nectar Thick Liquid: Not tested   Honey Thick Honey Thick Liquid: Not tested   Puree Puree: Not tested   Solid   GO   Solid: Within functional limits       [name] Baltimore, MA CCC-SLP [phone]  [name] [name], [name] [name] 08/11/2016,10:18 AM

## 2016-08-11 NOTE — H&P (Signed)
History and Physical    [name] [name] PIR:[id number] DOB: 1940-06-17 DOA: 08/10/2016  PCP: Hilaria Ota, MD  Patient coming from: Home.  History obtained from patient's wife. Patient appears confused.  Chief Complaint: Confusion.  HPI: [name] [name] is a 76 y.o. male with known history of advanced dementia, metastatic prostate cancer, Barrett's esophagus, hyperlipidemia was brought to the ER to patient's wife found that patient was getting increasingly confused since last evening around 7 PM. Patient did not have any nausea vomiting diarrhea chest pain or shortness of breath or productive cough. Has not had any recent change in medications. Has not lost his consciousness.   ED Course: In the ER patient was found to be in SVT which reverted back to normal sinus rhythm without any intervention. Chest x-ray shows features concerning for pneumonia. Patient appears confused. Patient will be admitted for pneumonia. On exam patient is only oriented to his name. Follows commands.  Review of Systems: As per HPI, rest all negative.   Past Medical History:  Diagnosis Date  . Anxiety   . Fatigue   . GERD (gastroesophageal reflux disease)   . Hyperlipidemia   . Hypertension    denies, has had hypotension  . Metastatic bone tumor ([place]) 11/13/2010   RT VAT , RESECTION OF 3rd,4th ,5th ribs with reconstruction with allograft and titanium platesDR. BURNEY  . Prostate carcinoma ([place])   . Tachycardia    history of    Past Surgical History:  Procedure Laterality Date  . DENTAL SURGERY    . ESOPHAGOGASTRODUODENOSCOPY N/A 03/14/2014   Procedure: ESOPHAGOGASTRODUODENOSCOPY (EGD);  Surgeon: Rogene Houston, MD;  Location: [facility];  Service: Endoscopy;  Laterality: N/A;  . RESECTION BONE TUMOR RIB  [id number]   RT VAT , RESECTION OF 3rd,4th ,5th ribs with reconstruction with allograft and titanium platesDR. BURNEY     reports that he quit smoking about 42 years ago. His smoking use  included Cigarettes. He has a 30.00 pack-year smoking history. He has never used smokeless tobacco. He reports that he does not drink alcohol or use drugs.  Allergies  Allergen Reactions  . Oxycodone Nausea And Vomiting  . Penicillins Itching and Rash    Has patient had a PCN reaction causing immediate rash, facial/tongue/throat swelling, SOB or lightheadedness with hypotension: YES Has patient had a PCN reaction causing severe rash involving mucus membranes or skin necrosis: NO Has patient had a PCN reaction that required hospitalization: NO Has patient had a PCN reaction occurring within the last 10 years: NO If all of the above answers are "NO", then may proceed with Cephalosporin use.     Family History  Problem Relation Age of Onset  . Aneurysm Father     Prior to Admission medications   Medication Sig Start Date End Date Taking? Authorizing Provider  acetaminophen (TYLENOL) 325 MG tablet Take 650 mg by mouth every 6 (six) hours as needed for moderate pain.    Yes Historical Provider, MD  aspirin EC 81 MG tablet Take 81 mg by mouth every morning.    Yes Historical Provider, MD  Calcium Citrate-Vitamin D (CITRACAL + D PO) Take 2 tablets by mouth every morning.    Yes Historical Provider, MD  cholecalciferol (VITAMIN D) 1000 units tablet Take 1,000 Units by mouth every morning.    Yes Historical Provider, MD  ferrous sulfate 325 (65 FE) MG tablet Take 325 mg by mouth daily with breakfast.   Yes Historical Provider, MD  ibuprofen (  ADVIL,MOTRIN) 200 MG tablet Take 200 mg by mouth every 4 (four) hours as needed for moderate pain. 11/13/14  Yes Historical Provider, MD  lidocaine-prilocaine (EMLA) cream Apply 1 application topically as needed. 03/25/16  Yes Wyatt Portela, MD  megestrol (MEGACE) 40 MG/ML suspension TAKE 2 TEASPOONFUL (10 ML) BY MOUTH TWICE DAILY (CASH PRICE $23.00 PER ADC) Patient taking differently: TAKE 2 TEASPOONFUL (10 ML) BY MOUTH ONCE DAILY (CASH PRICE $23.00 PER ADC)  06/22/16  Yes Wyatt Portela, MD  pantoprazole (PROTONIX) 40 MG tablet TAKE 1 TABLET BY MOUTH 30 MINUTES BEFORE BREAKFAST AND TAKE 1 TABLET BY MOUTH 30 MINUTES BEFORE SUPPER Patient taking differently: TAKE 40 MG BY MOUTH 30 MINUTES BEFORE BREAKFAST AND TAKE 1 TABLET BY MOUTH 30 MINUTES BEFORE SUPPER 06/22/16  Yes Butch Penny, NP  potassium chloride SA (K-DUR,KLOR-CON) 20 MEQ tablet TAKE 1 TABLET BY MOUTH DAILY Patient taking differently: TAKE 20 MEQ BY MOUTH DAILY 07/22/16  Yes Wyatt Portela, MD  prochlorperazine (COMPAZINE) 10 MG tablet Take 1 tablet (10 mg total) by mouth every 6 (six) hours as needed for nausea or vomiting. 09/20/13  Yes Wyatt Portela, MD  simvastatin (ZOCOR) 40 MG tablet Take 40 mg by mouth every evening. Reported on 08/14/2015   Yes Historical Provider, MD  traMADol (ULTRAM) 50 MG tablet Take 1 tablet (50 mg total) by mouth every 6 (six) hours as needed. Patient taking differently: Take 50 mg by mouth every 6 (six) hours as needed for moderate pain or severe pain.  06/11/16  Yes Wyatt Portela, MD  ZYTIGA 250 MG tablet TAKE 4 TABLETS BY MOUTH DAILY. TAKE ON AN EMPTY STOMACH 1 HOUR BEFORE OR 2 HOURS AFTER A MEAL Patient taking differently: TAKE 1000 MG BY MOUTH DAILY. TAKE ON AN EMPTY STOMACH 1 HOUR BEFORE OR 2 HOURS AFTER A MEAL 07/29/16  Yes Wyatt Portela, MD    Physical Exam: Vitals:   08/10/16 2340 08/10/16 2343 08/11/16 0044 08/11/16 0241  BP: 91/65 104/72 115/78 (!) 142/81  Pulse: (!) 153 86 92 75  Resp: 20 17 19    Temp:    97.8 F (36.6 C)  TempSrc:    Oral  SpO2: 97% 97% 100% 100%  Weight:    70.9 kg (156 lb 4.9 oz)  Height:    5\' 6"  (1.676 m)      Constitutional: Moderately built and nourished. Vitals:   08/10/16 2340 08/10/16 2343 08/11/16 0044 08/11/16 0241  BP: 91/65 104/72 115/78 (!) 142/81  Pulse: (!) 153 86 92 75  Resp: 20 17 19    Temp:    97.8 F (36.6 C)  TempSrc:    Oral  SpO2: 97% 97% 100% 100%  Weight:    70.9 kg (156 lb 4.9 oz)  Height:     5\' 6"  (1.676 m)   Eyes: Anicteric no pallor. ENMT: No discharge from the ears eyes nose or mouth. Neck: No mass felt. No neck rigidity. Respiratory: No rhonchi or crepitations. Cardiovascular: S1-S2 heard no murmurs appreciated. Abdomen: Soft nontender bowel sounds present. Musculoskeletal: No edema. No joint effusion. Skin: No rash or skin appears warm. Neurologic: Alert awake oriented to his name follows commands and moves all extremities. Psychiatric: Appears confused.   Labs on Admission: I have personally reviewed following labs and imaging studies  CBC:  Recent Labs Lab 08/10/16 2326 08/10/16 2348  WBC 11.2*  --   NEUTROABS 6.8  --   HGB 13.6 13.9  HCT 40.4 41.0  MCV 92.7  --   PLT 237  --    Basic Metabolic Panel:  Recent Labs Lab 08/10/16 2326 08/10/16 2348  NA 136 139  K 3.7 3.7  CL 107 104  CO2 23  --   GLUCOSE 112* 109*  BUN 19 21*  CREATININE 1.08 1.10  CALCIUM 8.7*  --   MG 1.9  --    GFR: Estimated Creatinine Clearance: 53.2 mL/min (by C-G formula based on SCr of 1.1 mg/dL). Liver Function Tests:  Recent Labs Lab 08/10/16 2326  AST 22  ALT 15*  ALKPHOS 48  BILITOT 0.6  PROT 6.4*  ALBUMIN 3.5   No results for input(s): LIPASE, AMYLASE in the last 168 hours. No results for input(s): AMMONIA in the last 168 hours. Coagulation Profile:  Recent Labs Lab 08/10/16 2326  INR 0.99   Cardiac Enzymes: No results for input(s): CKTOTAL, CKMB, CKMBINDEX, TROPONINI in the last 168 hours. BNP (last 3 results) No results for input(s): PROBNP in the last 8760 hours. HbA1C: No results for input(s): HGBA1C in the last 72 hours. CBG:  Recent Labs Lab 08/10/16 2306  GLUCAP 125*   Lipid Profile: No results for input(s): CHOL, HDL, LDLCALC, TRIG, CHOLHDL, LDLDIRECT in the last 72 hours. Thyroid Function Tests: No results for input(s): TSH, T4TOTAL, FREET4, T3FREE, THYROIDAB in the last 72 hours. Anemia Panel: No results for input(s):  VITAMINB12, FOLATE, FERRITIN, TIBC, IRON, RETICCTPCT in the last 72 hours. Urine analysis:    Component Value Date/Time   COLORURINE YELLOW 08/11/2016 0040   APPEARANCEUR CLEAR 08/11/2016 0040   LABSPEC 1.019 08/11/2016 0040   PHURINE 5.0 08/11/2016 0040   GLUCOSEU NEGATIVE 08/11/2016 0040   HGBUR NEGATIVE 08/11/2016 0040   BILIRUBINUR NEGATIVE 08/11/2016 0040   KETONESUR NEGATIVE 08/11/2016 0040   PROTEINUR NEGATIVE 08/11/2016 0040   UROBILINOGEN 0.2 11/11/2010 1312   NITRITE NEGATIVE 08/11/2016 0040   LEUKOCYTESUR NEGATIVE 08/11/2016 0040   Sepsis Labs: @LABRCNTIP (procalcitonin:4,lacticidven:4) )No results found for this or any previous visit (from the past 240 hour(s)).   Radiological Exams on Admission: Dg Chest 2 View  Result Date: 08/10/2016 CLINICAL DATA:  Acute onset of shortness of breath and altered mental status. Initial encounter. EXAM: CHEST  2 VIEW COMPARISON:  Chest radiograph performed 03/01/2014, and CT of the chest performed 10/01/2015 FINDINGS: The lungs are hypoexpanded. Right midlung airspace opacity raises concern for pneumonia. There is no evidence of pleural effusion or pneumothorax. The heart is mildly enlarged. No acute osseous abnormalities are seen. Right rib hardware is noted. A right-sided chest port is noted ending about the proximal right atrium. IMPRESSION: 1. Lungs hypoexpanded. Right midlung airspace opacity raises concern for pneumonia. 2. Mild cardiomegaly. Electronically Signed   By: Garald Balding M.D.   On: 08/10/2016 23:50   Ct Head Wo Contrast  Result Date: 08/11/2016 CLINICAL DATA:  Increasing confusion and disorientation. History of prostate cancer and dementia. EXAM: CT HEAD WITHOUT CONTRAST TECHNIQUE: Contiguous axial images were obtained from the base of the skull through the vertex without intravenous contrast. COMPARISON:  MRI of the brain from 10/28/2013 FINDINGS: BRAIN: There is mild-to-moderate sulcal and ventricular prominence  consistent with superficial and central atrophy. No intraparenchymal hemorrhage, mass effect nor midline shift. Periventricular and subcortical white matter hypodensities consistent with chronic small vessel ischemic disease are identified. No acute large vascular territory infarcts. No abnormal extra-axial fluid collections. Basal cisterns are not effaced and midline. VASCULAR: Moderate calcific atherosclerosis of the carotid siphons. SKULL: No skull fracture. None osteoblastic  disease. No significant scalp soft tissue swelling. SINUSES/ORBITS: The mastoid air-cells are clear. The included paranasal sinuses are well-aerated.The included ocular globes and orbital contents are non-suspicious. OTHER: None. IMPRESSION: Cerebral atrophy with moderate degree of chronic small vessel ischemic disease involving the periventricular and subcortical white matter. No acute intracranial abnormality. Electronically Signed   By: Ashley Royalty M.D.   On: 08/11/2016 00:39    EKG: Independently reviewed. Initial EKG shows SVT. Repeat one shows normal sinus rhythm.  Assessment/Plan Principal Problem:   Acute encephalopathy Active Problems:   Prostate carcinoma ([place])   CAP (community acquired pneumonia)    1. Acute encephalopathy most likely from pneumonia - patient has been placed on empiric antibiotics for community-acquired pneumonia. Check influenza PCR urine for Legionella and strep antigen and sputum cultures. Is also to be noted that patient's urine drug screen is positive for marijuana. Patient's wife states patient does not smoke marijuana. CT head is unremarkable. Will obtain swallow evaluation. 2. SVT- patient's wife states that patient has had previous episodes of tachycardia and had followed up with cardiologist in [place]. Usually gets attacks when patient drinks caffeine. Has not had any caffeine recently. Will check TSH and continue to monitor and telemetry. Presently in sinus  rhythm. 3. Metastatic prostate cancer being followed by urologist. 4. Dementia - presently on no medications. 5. Hyperlipidemia on statins.   DVT prophylaxis: Lovenox. Code Status: DO NOT RESUSCITATE as confirmed with patient's wife.  Family Communication: Patient's wife.  Disposition Plan: To be determined.  Consults called: Swallow evaluation. Admission status: Inpatient.    Rise Patience MD [facility] Pager [phone].  If 7PM-7AM, please contact night-coverage [email or web address] Password [facility]  08/11/2016, 4:47 AM

## 2016-08-11 NOTE — Progress Notes (Signed)
Wife plans to stay with patient tonight, pt is anxious and restless and having frequent urge to urinate. MD notified. SRP, RN

## 2016-08-11 NOTE — Progress Notes (Signed)
PROGRESS NOTE  [name] [name] HYW:[id number] DOB: 1940-08-18 DOA: 08/10/2016 PCP: Hilaria Ota, MD  HPI/Recap of past 8 hours: 76 year old male past oral history of mild dementia and metastatic prostate cancer plus hypertension who was brought in by his wife after she noted that he was getting more confused over the past few days. Patient found to be in acute SVT in the emergency room however it resolved prior to any intervention. Confusion seems to be slightly improving. Chest x-ray noted questionable pneumonia so patient was brought in for further evaluation.  This morning, patient went back into SVT. Broke with dose of IV Lopressor. Cardiology consulted. Patient himself seems to be closer to his baseline for alertness according to his wife. She states that he is followed by cardiologist in general for his heart history, but has no previous history of SVT.  Assessment/Plan: Principal Problem:   Acute encephalopathy with underlying senile dementia: Improved once SVT resolved. Pro calcitonin confirms no evidence of pneumonia. Stopped antibiotics. Suspect that he goes and SVT which leads to confusion. Continue when necessary Lopressor and cardiology to see. No need for echocardiogram as he had an echocardiogram done 2 weeks ago his cardiologist office by Dr. Orpah Greek in [place]. We will have records. Active Problems:   Prostate carcinoma [facility]): Getting chemotherapy. Restart. In review of literature, his chemotherapy has report of causing arrhythmia in low percentage of patients.  Have notified oncology of his admission.   Hypertension   Port catheter in place: Port looks clean, do not think that he has acute infection.   SVT (supraventricular tachycardia) ([place]): As above. When necessary Lopressor awaiting for cardiology   Code Status: DO NOT RESUSCITATE  Family Communication: Wife at the bedside   Disposition Plan: At this may discharge in next 24-48 hours after seen by cardiology.  Overall improving    Consultants:  Cardiology   Procedures:  None.   Antimicrobials:  Given antibiotics on admission, discontinued   DVT prophylaxis:  Lovenox   Objective: Vitals:   08/10/16 2343 08/11/16 0044 08/11/16 0241 08/11/16 0457  BP: 104/72 115/78 (!) 142/81 122/88  Pulse: 86 92 75 86  Resp: 17 19  20   Temp:   97.8 F (36.6 C) (!) 100.7 F (38.2 C)  TempSrc:   Oral Oral  SpO2: 97% 100% 100% 97%  Weight:   70.9 kg (156 lb 4.9 oz)   Height:   5\' 6"  (1.676 m)     Intake/Output Summary (Last 24 hours) at 08/11/16 1429 Last data filed at 08/11/16 1148  Gross per 24 hour  Intake              240 ml  Output              100 ml  Net              140 ml   Filed Weights   08/10/16 2339 08/11/16 0241  Weight: 63.5 kg (140 lb) 70.9 kg (156 lb 4.9 oz)    Exam:   General:  Alert and oriented 2, no acute distress   Cardiovascular: Regular rate and rhythm, S1 and S2   Respiratory: Decreased breath sounds throughout secondary to body habitus   Abdomen: Soft, obese, nontender, positive bowel sounds   Musculoskeletal: No clubbing or cyanosis, trace pitting edema   Skin: No skin breaks, tears or lesions Psychiatry: Patient is appropriate, no evidence of psychoses  Data Reviewed: CBC:  Recent Labs Lab 08/10/16 2326 08/10/16 2348 08/11/16  0551  WBC 11.2*  --  8.5  NEUTROABS 6.8  --  5.0  HGB 13.6 13.9 11.4*  HCT 40.4 41.0 34.1*  MCV 92.7  --  92.4  PLT 237  --  237   Basic Metabolic Panel:  Recent Labs Lab 08/10/16 2326 08/10/16 2348 08/11/16 0551  NA 136 139 137  K 3.7 3.7 3.6  CL 107 104 108  CO2 23  --  23  GLUCOSE 112* 109* 99  BUN 19 21* 16  CREATININE 1.08 1.10 0.80  CALCIUM 8.7*  --  8.1*  MG 1.9  --   --    GFR: Estimated Creatinine Clearance: 73.1 mL/min (by C-G formula based on SCr of 0.8 mg/dL). Liver Function Tests:  Recent Labs Lab 08/10/16 2326 08/11/16 0551  AST 22 17  ALT 15* 13*  ALKPHOS 48 39  BILITOT 0.6  0.7  PROT 6.4* 5.4*  ALBUMIN 3.5 3.0*   No results for input(s): LIPASE, AMYLASE in the last 168 hours. No results for input(s): AMMONIA in the last 168 hours. Coagulation Profile:  Recent Labs Lab 08/10/16 2326  INR 0.99   Cardiac Enzymes:  Recent Labs Lab 08/11/16 0551  TROPONINI <0.03   BNP (last 3 results) No results for input(s): PROBNP in the last 8760 hours. HbA1C: No results for input(s): HGBA1C in the last 72 hours. CBG:  Recent Labs Lab 08/10/16 2306  GLUCAP 125*   Lipid Profile: No results for input(s): CHOL, HDL, LDLCALC, TRIG, CHOLHDL, LDLDIRECT in the last 72 hours. Thyroid Function Tests:  Recent Labs  08/11/16 0551  TSH 1.879   Anemia Panel: No results for input(s): VITAMINB12, FOLATE, FERRITIN, TIBC, IRON, RETICCTPCT in the last 72 hours. Urine analysis:    Component Value Date/Time   COLORURINE YELLOW 08/11/2016 0040   APPEARANCEUR CLEAR 08/11/2016 0040   LABSPEC 1.019 08/11/2016 0040   PHURINE 5.0 08/11/2016 0040   GLUCOSEU NEGATIVE 08/11/2016 0040   HGBUR NEGATIVE 08/11/2016 0040   BILIRUBINUR NEGATIVE 08/11/2016 0040   KETONESUR NEGATIVE 08/11/2016 0040   PROTEINUR NEGATIVE 08/11/2016 0040   UROBILINOGEN 0.2 11/11/2010 1312   NITRITE NEGATIVE 08/11/2016 0040   LEUKOCYTESUR NEGATIVE 08/11/2016 0040   Sepsis Labs: @LABRCNTIP (procalcitonin:4,lacticidven:4)  )No results found for this or any previous visit (from the past 240 hour(s)).    Studies: Dg Chest 2 View  Result Date: 08/10/2016 CLINICAL DATA:  Acute onset of shortness of breath and altered mental status. Initial encounter. EXAM: CHEST  2 VIEW COMPARISON:  Chest radiograph performed 03/01/2014, and CT of the chest performed 10/01/2015 FINDINGS: The lungs are hypoexpanded. Right midlung airspace opacity raises concern for pneumonia. There is no evidence of pleural effusion or pneumothorax. The heart is mildly enlarged. No acute osseous abnormalities are seen. Right rib  hardware is noted. A right-sided chest port is noted ending about the proximal right atrium. IMPRESSION: 1. Lungs hypoexpanded. Right midlung airspace opacity raises concern for pneumonia. 2. Mild cardiomegaly. Electronically Signed   By: Garald Balding M.D.   On: 08/10/2016 23:50   Ct Head Wo Contrast  Result Date: 08/11/2016 CLINICAL DATA:  Increasing confusion and disorientation. History of prostate cancer and dementia. EXAM: CT HEAD WITHOUT CONTRAST TECHNIQUE: Contiguous axial images were obtained from the base of the skull through the vertex without intravenous contrast. COMPARISON:  MRI of the brain from 10/28/2013 FINDINGS: BRAIN: There is mild-to-moderate sulcal and ventricular prominence consistent with superficial and central atrophy. No intraparenchymal hemorrhage, mass effect nor midline shift. Periventricular and subcortical white  matter hypodensities consistent with chronic small vessel ischemic disease are identified. No acute large vascular territory infarcts. No abnormal extra-axial fluid collections. Basal cisterns are not effaced and midline. VASCULAR: Moderate calcific atherosclerosis of the carotid siphons. SKULL: No skull fracture. None osteoblastic disease. No significant scalp soft tissue swelling. SINUSES/ORBITS: The mastoid air-cells are clear. The included paranasal sinuses are well-aerated.The included ocular globes and orbital contents are non-suspicious. OTHER: None. IMPRESSION: Cerebral atrophy with moderate degree of chronic small vessel ischemic disease involving the periventricular and subcortical white matter. No acute intracranial abnormality. Electronically Signed   By: Ashley Royalty M.D.   On: 08/11/2016 00:39    Scheduled Meds: . aspirin EC  81 mg Oral Daily  . enoxaparin (LOVENOX) injection  40 mg Subcutaneous Q24H  . ferrous sulfate  325 mg Oral Q breakfast  . simvastatin  40 mg Oral QPM    Continuous Infusions: . sodium chloride 10 mL/hr at 08/11/16 1148      LOS: 0 days    Annita Brod, MD [facility] Pager [phone]  If 7PM-7AM, please contact night-coverage [email or web address] Password [facility] 08/11/2016, 2:29 PM

## 2016-08-11 NOTE — Progress Notes (Signed)
Pt craving tobacco product, wife states he chew tobacco constantly at home and is becoming anxious for the need to do so, MD called to request Nicotine patch. SRP, RN

## 2016-08-12 ENCOUNTER — Encounter (HOSPITAL_COMMUNITY): Payer: Self-pay | Admitting: Cardiology

## 2016-08-12 ENCOUNTER — Inpatient Hospital Stay (HOSPITAL_COMMUNITY): Payer: Medicare Other

## 2016-08-12 DIAGNOSIS — I471 Supraventricular tachycardia: Principal | ICD-10-CM

## 2016-08-12 DIAGNOSIS — I452 Bifascicular block: Secondary | ICD-10-CM | POA: Diagnosis present

## 2016-08-12 DIAGNOSIS — G9340 Encephalopathy, unspecified: Secondary | ICD-10-CM

## 2016-08-12 DIAGNOSIS — I4430 Unspecified atrioventricular block: Secondary | ICD-10-CM | POA: Diagnosis present

## 2016-08-12 LAB — URINE CULTURE: Culture: NO GROWTH

## 2016-08-12 MED ORDER — METOPROLOL SUCCINATE ER 25 MG PO TB24
25.0000 mg | ORAL_TABLET | Freq: Every day | ORAL | Status: DC
  Administered 2016-08-12 – 2016-08-14 (×3): 25 mg via ORAL
  Filled 2016-08-12 (×3): qty 1

## 2016-08-12 MED ORDER — POTASSIUM CHLORIDE CRYS ER 20 MEQ PO TBCR
40.0000 meq | EXTENDED_RELEASE_TABLET | Freq: Once | ORAL | Status: AC
Start: 2016-08-12 — End: 2016-08-12
  Administered 2016-08-12: 40 meq via ORAL
  Filled 2016-08-12: qty 2

## 2016-08-12 MED ORDER — LORAZEPAM 2 MG/ML IJ SOLN
0.5000 mg | Freq: Once | INTRAMUSCULAR | Status: AC
  Administered 2016-08-12: 0.5 mg via INTRAVENOUS
  Filled 2016-08-12: qty 1

## 2016-08-12 NOTE — Progress Notes (Addendum)
PROGRESS NOTE  [name] [name] URK:[id number] DOB: 06-22-1940 DOA: 08/10/2016   PCP: Hilaria Ota, MD  HPI/Recap of past 27 hours: 76 year old male past oral history of mild dementia and metastatic prostate cancer plus hypertension who was brought in by his wife after she noted that he was getting more confused over the past few days prior to this admission. Patient found to be in acute SVT in the emergency room however it resolved prior to any intervention. He has had another episode of SVT and cardiology was consulted.   Assessment/Plan: Principal Problem:   Acute metabolic encephalopathy with underlying senile dementia - unclear etiology and suspected to be due to SVT - There was also concern for RML lung PNA however antibiotics have been discontinued as there was no fevers, no leukocytosis and oxygen saturations have been stable - Patient mental status is still not at baseline per patient's wife - Patient is currently in normal sinus rhythm and cardiology recommended treating with beta blocker metoprolol, order placed - patient has mild rhonchi's at bases with evidence of very weak cough slightly productive - CT chest for cleared evaluation  Active Problems   Prostate carcinoma [facility]) - follows with Dr. Alen Blew, notified of pt's admission via EPIC    Hypertension - reasonable inpatient control    Port catheter in place [facility] looks clean, no evidence of an acute infection.  Code Status: DO NOT RESUSCITATE Family Communication: Wife at the bedside  Disposition Plan: May discharge in am depending on mental state, per wife pt is still very confused   Consultants:  Cardiology  Procedures:  None.  Antimicrobials:  Given antibiotics on admission, discontinued at this time  DVT prophylaxis:    Lovenox SQ   Objective: Vitals:   08/11/16 0457 08/11/16 1435 08/11/16 2244 08/12/16 0430  BP: 122/88 108/74 (!) 142/82 140/76  Pulse: 86 66 68 65  Resp: 20 19 18 18   Temp:  (!) 100.7 F (38.2 C) 97.8 F (36.6 C) 98.5 F (36.9 C) 98.7 F (37.1 C)  TempSrc: Oral Oral Oral Oral  SpO2: 97% 100% 98% 97%  Weight:      Height:        Intake/Output Summary (Last 24 hours) at 08/12/16 1248 Last data filed at 08/12/16 1121  Gross per 24 hour  Intake          1889.92 ml  Output             4250 ml  Net         -2360.08 ml   Filed Weights   08/10/16 2339 08/11/16 0241  Weight: 63.5 kg (140 lb) 70.9 kg (156 lb 4.9 oz)    Exam:   General:  Alert and oriented to name only   Cardiovascular: Regular rate and rhythm, S1 and S2   Respiratory: Rhonchi at bases and worse on the right side, noted weak cough and slightly productive   Abdomen: Soft, obese, nontender, positive bowel sounds   Musculoskeletal: No clubbing or cyanosis, trace pitting edema   Skin: No skin breaks, tears or lesions  Psychiatry: Patient seems confused this AM, asking for car and keys, easy to reorient    Data Reviewed: CBC:  Recent Labs Lab 08/10/16 2326 08/10/16 2348 08/11/16 0551  WBC 11.2*  --  8.5  NEUTROABS 6.8  --  5.0  HGB 13.6 13.9 11.4*  HCT 40.4 41.0 34.1*  MCV 92.7  --  92.4  PLT 237  --  204  Basic Metabolic Panel:  Recent Labs Lab 08/10/16 2326 08/10/16 2348 08/11/16 0551  NA 136 139 137  K 3.7 3.7 3.6  CL 107 104 108  CO2 23  --  23  GLUCOSE 112* 109* 99  BUN 19 21* 16  CREATININE 1.08 1.10 0.80  CALCIUM 8.7*  --  8.1*  MG 1.9  --   --    Liver Function Tests:  Recent Labs Lab 08/10/16 2326 08/11/16 0551  AST 22 17  ALT 15* 13*  ALKPHOS 48 39  BILITOT 0.6 0.7  PROT 6.4* 5.4*  ALBUMIN 3.5 3.0*   Coagulation Profile:  Recent Labs Lab 08/10/16 2326  INR 0.99   Cardiac Enzymes:  Recent Labs Lab 08/11/16 0551  TROPONINI <0.03   CBG:  Recent Labs Lab 08/10/16 2306  GLUCAP 125*   Thyroid Function Tests:  Recent Labs  08/11/16 0551  TSH 1.879   Urine analysis:    Component Value Date/Time   COLORURINE YELLOW  08/11/2016 0040   APPEARANCEUR CLEAR 08/11/2016 0040   LABSPEC 1.019 08/11/2016 0040   PHURINE 5.0 08/11/2016 0040   GLUCOSEU NEGATIVE 08/11/2016 0040   HGBUR NEGATIVE 08/11/2016 0040   BILIRUBINUR NEGATIVE 08/11/2016 0040   KETONESUR NEGATIVE 08/11/2016 0040   PROTEINUR NEGATIVE 08/11/2016 0040   UROBILINOGEN 0.2 11/11/2010 1312   NITRITE NEGATIVE 08/11/2016 0040   LEUKOCYTESUR NEGATIVE 08/11/2016 0040   Recent Results (from the past 240 hour(s))  Urine culture     Status: None   Collection Time: 08/11/16 12:39 AM  Result Value Ref Range Status   Specimen Description URINE, RANDOM  Final   Special Requests NONE  Final   Culture   Final    NO GROWTH Performed at [facility], [place] [address], [place], [place] [address]    Report Status 08/12/2016 FINAL  Final    Studies: No results found.  Scheduled Meds: . aspirin EC  81 mg Oral Daily  . enoxaparin (LOVENOX) injection  40 mg Subcutaneous Q24H  . ferrous sulfate  325 mg Oral Q breakfast  . metoprolol succinate  25 mg Oral Daily  . simvastatin  40 mg Oral QPM   Continuous Infusions:   LOS: 1 day   Faye Ramsay, MD [facility] Pager [phone]  If 7PM-7AM, please contact night-coverage [email or web address] Password [facility] 08/12/2016, 12:48 PM

## 2016-08-12 NOTE — Consult Note (Signed)
Reason for Consult:   PSVT  Requesting Physician: Dr Hal Hope Primary Cardiologist New  HPI:   [name] [name] is a 76 y.o. male who is being seen today for the evaluation of PSVT at the request of Dr Hal Hope.   Asked to see this 76 y/o male from [place] for PSVT. The pt has dementia and prostate cancer with metastasis followed by Dr Alen Blew. He has no history of CAD or MI per his records, (no family was present this morning when I interviewed him). Review of his records does note a history of past palpitations. I reviewed his EKGs going back to 2012. 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   The pt presented 08/10/16 from home with increased confusion and was noted to be in an SVT with rate 165. He converted spontaneously in the ED. He was felt to have Rt sided pneumonia on CXR. Since admission he has not had recurrent SVT on telemetry but he had pulled his monitor off when I saw him this am. His K+ on admission was 3.7, TSH WNL. He does have a history of HTN and HLD and was on Zocor and ASA 81 mg prior to admission. Review of past records indicate he had been on Tenormin in the past as well, this may have been stopped secondary to hypotension.     PMHx:  Past Medical History:  Diagnosis Date  . Anxiety   . 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 Barrett's esophageal ulceration 2015  . Fatigue   . H/O: GI bleed 2015  . Hyperlipidemia   . Hypertension    denies, has had hypotension  . 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 BURNEY  . Prostate carcinoma ([place])   . PSVT (paroxysmal supraventricular tachycardia) ([place]) 07/2016  . RBBB (right bundle branch block with left anterior fascicular block)   . Tachycardia    history of    Past Surgical History:  Procedure Laterality Date  . DENTAL SURGERY    .  ESOPHAGOGASTRODUODENOSCOPY N/A 03/14/2014   Procedure: ESOPHAGOGASTRODUODENOSCOPY (EGD);  Surgeon: Rogene Houston, MD;  Location: [facility];  Service: Endoscopy;  Laterality: N/A;  . RESECTION BONE TUMOR RIB  [id number]   RT VAT , RESECTION OF 3rd,4th ,5th ribs with reconstruction with allograft and titanium platesDR. BURNEY    SOCHx:  reports that he quit smoking about 42 years ago. His smoking use included Cigarettes. He has a 30.00 pack-year smoking history. He has never used smokeless tobacco. He reports that he does not drink alcohol or use drugs.  FAMHx: Family History  Problem Relation Age of Onset  . Aneurysm Father     ALLERGIES: Allergies  Allergen Reactions  . Oxycodone Nausea And Vomiting  . Penicillins Itching and Rash    Has patient had a PCN reaction causing immediate rash, facial/tongue/throat swelling, SOB or lightheadedness with hypotension: YES Has patient had a PCN reaction causing severe rash involving mucus membranes or skin necrosis: NO Has patient had a PCN reaction that required hospitalization: NO Has patient had a PCN reaction occurring within the last 10 years: NO If all of the above answers are "NO", then may proceed with Cephalosporin use.     ROS: Review of Systems: General: negative for chills, fever, night sweats or weight changes.  Cardiovascular:  negative for chest pain, dyspnea on exertion, edema, orthopnea, palpitations, paroxysmal nocturnal dyspnea or shortness of breath HEENT: negative for any visual disturbances, blindness, glaucoma, wears glasses Dermatological: negative for rash Respiratory: negative for cough, hemoptysis, or wheezing Urologic: negative for hematuria or dysuria Abdominal: negative for nausea, vomiting, diarrhea, bright red blood per rectum, melena, or hematemesis Neurologic: negative for visual changes, syncope, or dizziness Musculoskeletal: negative for back pain, joint pain, or swelling Psych: pt is alert and  cooperative but clearly confused ast to place, date All other systems reviewed and are otherwise negative except as noted above.   HOME MEDICATIONS: Prior to Admission medications   Medication Sig Start Date End Date Taking? Authorizing Provider  acetaminophen (TYLENOL) 325 MG tablet Take 650 mg by mouth every 6 (six) hours as needed for moderate pain.    Yes Historical Provider, MD  aspirin EC 81 MG tablet Take 81 mg by mouth every morning.    Yes Historical Provider, MD  Calcium Citrate-Vitamin D (CITRACAL + D PO) Take 2 tablets by mouth every morning.    Yes Historical Provider, MD  cholecalciferol (VITAMIN D) 1000 units tablet Take 1,000 Units by mouth every morning.    Yes Historical Provider, MD  ferrous sulfate 325 (65 FE) MG tablet Take 325 mg by mouth daily with breakfast.   Yes Historical Provider, MD  ibuprofen (ADVIL,MOTRIN) 200 MG tablet Take 200 mg by mouth every 4 (four) hours as needed for moderate pain. 11/13/14  Yes Historical Provider, MD  lidocaine-prilocaine (EMLA) cream Apply 1 application topically as needed. 03/25/16  Yes Wyatt Portela, MD  megestrol (MEGACE) 40 MG/ML suspension TAKE 2 TEASPOONFUL (10 ML) BY MOUTH TWICE DAILY (CASH PRICE $23.00 PER ADC) Patient taking differently: TAKE 2 TEASPOONFUL (10 ML) BY MOUTH ONCE DAILY (CASH PRICE $23.00 PER ADC) 06/22/16  Yes Wyatt Portela, MD  pantoprazole (PROTONIX) 40 MG tablet TAKE 1 TABLET BY MOUTH 30 MINUTES BEFORE BREAKFAST AND TAKE 1 TABLET BY MOUTH 30 MINUTES BEFORE SUPPER Patient taking differently: TAKE 40 MG BY MOUTH 30 MINUTES BEFORE BREAKFAST AND TAKE 1 TABLET BY MOUTH 30 MINUTES BEFORE SUPPER 06/22/16  Yes Butch Penny, NP  potassium chloride SA (K-DUR,KLOR-CON) 20 MEQ tablet TAKE 1 TABLET BY MOUTH DAILY Patient taking differently: TAKE 20 MEQ BY MOUTH DAILY 07/22/16  Yes Wyatt Portela, MD  prochlorperazine (COMPAZINE) 10 MG tablet Take 1 tablet (10 mg total) by mouth every 6 (six) hours as needed for nausea or  vomiting. 09/20/13  Yes Wyatt Portela, MD  simvastatin (ZOCOR) 40 MG tablet Take 40 mg by mouth every evening. Reported on 08/14/2015   Yes Historical Provider, MD  traMADol (ULTRAM) 50 MG tablet Take 1 tablet (50 mg total) by mouth every 6 (six) hours as needed. Patient taking differently: Take 50 mg by mouth every 6 (six) hours as needed for moderate pain or severe pain.  06/11/16  Yes Wyatt Portela, MD  ZYTIGA 250 MG tablet TAKE 4 TABLETS BY MOUTH DAILY. TAKE ON AN EMPTY STOMACH 1 HOUR BEFORE OR 2 HOURS AFTER A MEAL Patient taking differently: TAKE 1000 MG BY MOUTH DAILY. TAKE ON AN EMPTY STOMACH 1 HOUR BEFORE OR 2 HOURS AFTER A MEAL 07/29/16  Yes Wyatt Portela, MD    HOSPITAL MEDICATIONS: I have reviewed the patient's current medications.  VITALS: Blood pressure 140/76, pulse 65, temperature 98.7 F (37.1 C), temperature source Oral, resp. rate 18, height 5\' 6"  (1.676 m), weight 156 lb 4.9 oz (  70.9 kg), SpO2 97 %.  PHYSICAL EXAM: General appearance: alert, cooperative and no distress Neck: no carotid bruit and no JVD Lungs: clear to auscultation bilaterally and Rt upper chest catheter in place Heart: regular rate and rhythm Abdomen: soft, non-tender; bowel sounds normal; no masses,  no organomegaly Extremities: trace edema Pulses: 2+ LLE pulse, decreased RLE distal pulses Skin: Skin color, texture, turgor normal. No rashes or lesions Neurologic: Grossly normal, but confused  LABS: Results for orders placed or performed during the hospital encounter of 08/10/16 (from the past 24 hour(s))  Influenza panel by PCR (type A & B)     Status: None   Collection Time: 08/11/16  8:33 AM  Result Value Ref Range   Influenza A By PCR NEGATIVE NEGATIVE   Influenza B By PCR NEGATIVE NEGATIVE    EKG: NSR, 1st AVB, RBBB, LAFB  IMAGING: Dg Chest 2 View  Result Date: 08/10/2016 CLINICAL DATA:  Acute onset of shortness of breath and altered mental status. Initial encounter. EXAM: CHEST  2 VIEW  COMPARISON:  Chest radiograph performed 03/01/2014, and CT of the chest performed 10/01/2015 FINDINGS: The lungs are hypoexpanded. Right midlung airspace opacity raises concern for pneumonia. There is no evidence of pleural effusion or pneumothorax. The heart is mildly enlarged. No acute osseous abnormalities are seen. Right rib hardware is noted. A right-sided chest port is noted ending about the proximal right atrium. IMPRESSION: 1. Lungs hypoexpanded. Right midlung airspace opacity raises concern for pneumonia. 2. Mild cardiomegaly. Electronically Signed   By: Garald Balding M.D.   On: 08/10/2016 23:50   Ct Head Wo Contrast  Result Date: 08/11/2016 CLINICAL DATA:  Increasing confusion and disorientation. History of prostate cancer and dementia. EXAM: CT HEAD WITHOUT CONTRAST TECHNIQUE: Contiguous axial images were obtained from the base of the skull through the vertex without intravenous contrast. COMPARISON:  MRI of the brain from 10/28/2013 FINDINGS: BRAIN: There is mild-to-moderate sulcal and ventricular prominence consistent with superficial and central atrophy. No intraparenchymal hemorrhage, mass effect nor midline shift. Periventricular and subcortical white matter hypodensities consistent with chronic small vessel ischemic disease are identified. No acute large vascular territory infarcts. No abnormal extra-axial fluid collections. Basal cisterns are not effaced and midline. VASCULAR: Moderate calcific atherosclerosis of the carotid siphons. SKULL: No skull fracture. None osteoblastic disease. No significant scalp soft tissue swelling. SINUSES/ORBITS: The mastoid air-cells are clear. The included paranasal sinuses are well-aerated.The included ocular globes and orbital contents are non-suspicious. OTHER: None. IMPRESSION: Cerebral atrophy with moderate degree of chronic small vessel ischemic disease involving the periventricular and subcortical white matter. No acute intracranial abnormality.  Electronically Signed   By: Ashley Royalty M.D.   On: 08/11/2016 00:39    IMPRESSION:  1. PSVT- Pt had PSVT on admission to the ED in the setting of CAP that converted spontaneously back to NSR. There is a reported history of palpitations in the past and the pt had been on a beta blocker previously that was stopped presumably secondary to hypotension.   2. 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 No history of syncope or bradycardia in his records.  3.Prostate cancer with metastasis- Followed by Dr Alen Blew- s/p surgery and chemotherapy.   4. Dementia- Unclear what his basline is. I expect his acute worsening this admission was from his acute illness. Head CT showed atrophy, no obvious mets.   5. H/O GI bleed- GI bleed requiring transfusion Nov 2016. He had documented Barrett's esophagus and PUD then.  6. Pneumonia- per primary service  RECOMMENDATION: MD to see. Toprol 25 mg daily. Keep K+ closer to 4.0 (3.6 this am). Doubt further cardiac work up indicated. OK to discontinue telemetry since the pt keeps pulling of his leads. Continue ASA, no indication for anticoagulation.   Time Spent Directly with Patient: 54 minutes  Kerin Ransom, Juliaetta beeper 08/12/2016, 8:13 AM

## 2016-08-13 LAB — BASIC METABOLIC PANEL
ANION GAP: 5 (ref 5–15)
BUN: 13 mg/dL (ref 6–20)
CALCIUM: 9 mg/dL (ref 8.9–10.3)
CO2: 26 mmol/L (ref 22–32)
Chloride: 99 mmol/L — ABNORMAL LOW (ref 101–111)
Creatinine, Ser: 0.77 mg/dL (ref 0.61–1.24)
GLUCOSE: 94 mg/dL (ref 65–99)
POTASSIUM: 4 mmol/L (ref 3.5–5.1)
SODIUM: 130 mmol/L — AB (ref 135–145)

## 2016-08-13 LAB — CBC
HCT: 39 % (ref 39.0–52.0)
Hemoglobin: 13.6 g/dL (ref 13.0–17.0)
MCH: 31.7 pg (ref 26.0–34.0)
MCHC: 34.9 g/dL (ref 30.0–36.0)
MCV: 90.9 fL (ref 78.0–100.0)
PLATELETS: 220 10*3/uL (ref 150–400)
RBC: 4.29 MIL/uL (ref 4.22–5.81)
RDW: 13.3 % (ref 11.5–15.5)
WBC: 8.1 10*3/uL (ref 4.0–10.5)

## 2016-08-13 LAB — PROCALCITONIN

## 2016-08-13 NOTE — Evaluation (Signed)
Physical Therapy Evaluation Patient Details Name: [name] [name] MRN: [id number] DOB: January 20, 1941 Today's Date: 08/13/2016   History of Present Illness  76 yo male admitted with acute encephalopathy, Pna, SVT. hx of prostate ca with mets, dementia.   Clinical Impression  On eval, pt required Mod assist +2 for safety. He was mildly agitated and reluctant to participate with therapy initially. Max encouragement required from wife. He walked ~75 feet with a small based quad cane. He is at high risk for falls currently. Pt presents with general weakness, decreased activity tolerance, and impaired gait and balance. Wife cannot care for pt in his current state. Recommend SNF for continued rehab.     Follow Up Recommendations SNF    Equipment Recommendations   (TBD at next venue)    Recommendations for Other Services OT consult     Precautions / Restrictions Precautions Precautions: Fall Restrictions Weight Bearing Restrictions: No      Mobility  Bed Mobility Overal bed mobility: Needs Assistance Bed Mobility: Supine to Sit     Supine to sit: Max assist;HOB elevated     General bed mobility comments: Assist needed for trunk and LEs. Max encouragement from wife. Increased assist due to pt's reluctance to participate.   Transfers Overall transfer level: Needs assistance   Transfers: Sit to/from Stand Sit to Stand: Mod assist;+2 safety/equipment         General transfer comment: Assist to rise, stabilize, control descent. Multimodal cues required.   Ambulation/Gait Ambulation/Gait assistance: Mod assist;+2 safety/equipment Ambulation Distance (Feet): 75 Feet Assistive device: Quad cane Gait Pattern/deviations: Step-through pattern;Decreased stride length     General Gait Details: Very unsteady. Assist to stabilize pt throughout ambulation. Noted improper sequencing and use of quad cane.   Stairs            Wheelchair Mobility    Modified Rankin (Stroke  Patients Only)       Balance Overall balance assessment: Needs assistance           Standing balance-Leahy Scale: Poor                               Pertinent Vitals/Pain Pain Assessment: No/denies pain    Home Living Family/patient expects to be discharged to:: Skilled nursing facility Living Arrangements: Spouse/significant other Available Help at Discharge: Family Type of Home: House         Home Equipment: Gilford Rile - 2 wheels;Cane - quad      Prior Function Level of Independence: Needs assistance   Gait / Transfers Assistance Needed: at baseline, ambulatory with a quad cane. wife has been unsuccessful in getting pt to use a walker  ADL's / Homemaking Assistance Needed: assist needed for bathing, dressing due to increased time and effort it requires of pt        Hand Dominance        Extremity/Trunk Assessment   Upper Extremity Assessment Upper Extremity Assessment: Defer to OT evaluation    Lower Extremity Assessment Lower Extremity Assessment: Generalized weakness    Cervical / Trunk Assessment Cervical / Trunk Assessment: Kyphotic  Communication   Communication: No difficulties  Cognition Arousal/Alertness: Awake/alert Behavior During Therapy: Agitated (mildly) Overall Cognitive Status: History of cognitive impairments - at baseline                                 General Comments: wife  states pt is usually pleasant      General Comments      Exercises     Assessment/Plan    PT Assessment Patient needs continued PT services  PT Problem List Decreased strength;Decreased mobility;Decreased activity tolerance;Decreased balance;Decreased knowledge of use of DME;Decreased cognition;Decreased safety awareness       PT Treatment Interventions DME instruction;Gait training;Therapeutic activities;Therapeutic exercise;Patient/family education;Balance training;Functional mobility training    PT Goals (Current goals can  be found in the Care Plan section)  Acute Rehab PT Goals Patient Stated Goal: per wife, for pt to return to baseline PT Goal Formulation: With family Time For Goal Achievement: 08/27/16 Potential to Achieve Goals: Good    Frequency Min 3X/week   Barriers to discharge        Co-evaluation               End of Session Equipment Utilized During Treatment: Gait belt Activity Tolerance: Patient tolerated treatment well;Treatment limited secondary to agitation Patient left: in bed;with call bell/phone within reach;with family/visitor present;with bed alarm set   PT Visit Diagnosis: Muscle weakness (generalized) (M62.81);Difficulty in walking, not elsewhere classified (R26.2)    Time: 0721-8288 PT Time Calculation (min) (ACUTE ONLY): 17 min   Charges:   PT Evaluation $PT Eval Low Complexity: 1 Procedure     PT G Codes:          Weston Anna, MPT Pager: [phone]

## 2016-08-13 NOTE — Progress Notes (Signed)
PROGRESS NOTE  [name] [name] XIP:[id number] DOB: 1940/05/01 DOA: 08/10/2016   PCP: Hilaria Ota, MD  HPI/Recap of past 76 hours: 76 year old male past oral history of mild dementia and metastatic prostate cancer plus hypertension who was brought in by his wife after she noted that he was getting more confused over the past few days prior to this admission. Patient found to be in acute SVT in the emergency room however it resolved prior to any intervention. He has had another episode of SVT and cardiology was consulted.   Assessment/Plan: Principal Problem:   Acute metabolic encephalopathy with underlying senile dementia - unclear etiology and suspected to be due to SVT - There was also concern for RML lung PNA however antibiotics have been discontinued as there was no fevers, no leukocytosis and oxygen saturations have been stable - CT chest with no evidence of PNA - blood work stable and at baseline - d/w wife that this new intermittent confusion can be new baseline and may not get better - PT/OT eval requested, I think pt would be appropriate for SNF   Active Problems   Prostate carcinoma ([place]) - follows with Dr. Alen Blew, notified of pt's admission via EPIC    Hyponatremia - mild, appears to be pre renal - encouraged oral intake - BMP In AM    Hypertension - reasonable inpatient control    Port catheter in place [facility] looks clean, no evidence of an acute infection.  Code Status: DO NOT RESUSCITATE Family Communication: Wife at the bedside  Disposition Plan: ? SNF, SW consulted for assistance   Consultants:  Cardiology   PT/OT  SW Procedures:  None.  Antimicrobials:  Given antibiotics on admission, discontinued at this time  DVT prophylaxis:    Lovenox SQ   Objective: Vitals:   08/12/16 0430 08/12/16 1326 08/12/16 2034 08/13/16 0438  BP: 140/76 138/78 123/79 122/89  Pulse: 65 76 86 65  Resp: 18 20 20 20   Temp: 98.7 F (37.1 C) 98 F (36.7 C) 98.1  F (36.7 C) 98.2 F (36.8 C)  TempSrc: Oral Oral Oral Oral  SpO2: 97% 98% 97% 94%  Weight:      Height:        Intake/Output Summary (Last 24 hours) at 08/13/16 1151 Last data filed at 08/13/16 1041  Gross per 24 hour  Intake               10 ml  Output              800 ml  Net             -790 ml   Filed Weights   08/10/16 2339 08/11/16 0241  Weight: 63.5 kg (140 lb) 70.9 kg (156 lb 4.9 oz)    Exam:   General:  Alert and oriented to name only, confused, calling names and not sure who is he calling   Cardiovascular: Regular rate and rhythm, S1 and S2   Respiratory: diminished breath sounds at bases  Abdomen: Soft, obese, nontender, positive bowel sounds   Musculoskeletal: No clubbing or cyanosis, trace pitting edema   Skin: No skin breaks, tears or lesions  Data Reviewed: CBC:  Recent Labs Lab 08/10/16 2326 08/10/16 2348 08/11/16 0551 08/13/16 0516  WBC 11.2*  --  8.5 8.1  NEUTROABS 6.8  --  5.0  --   HGB 13.6 13.9 11.4* 13.6  HCT 40.4 41.0 34.1* 39.0  MCV 92.7  --  92.4 90.9  PLT  237  --  204 546   Basic Metabolic Panel:  Recent Labs Lab 08/10/16 2326 08/10/16 2348 08/11/16 0551 08/13/16 0516  NA 136 139 137 130*  K 3.7 3.7 3.6 4.0  CL 107 104 108 99*  CO2 23  --  23 26  GLUCOSE 112* 109* 99 94  BUN 19 21* 16 13  CREATININE 1.08 1.10 0.80 0.77  CALCIUM 8.7*  --  8.1* 9.0  MG 1.9  --   --   --    Liver Function Tests:  Recent Labs Lab 08/10/16 2326 08/11/16 0551  AST 22 17  ALT 15* 13*  ALKPHOS 48 39  BILITOT 0.6 0.7  PROT 6.4* 5.4*  ALBUMIN 3.5 3.0*   Coagulation Profile:  Recent Labs Lab 08/10/16 2326  INR 0.99   Cardiac Enzymes:  Recent Labs Lab 08/11/16 0551  TROPONINI <0.03   CBG:  Recent Labs Lab 08/10/16 2306  GLUCAP 125*   Thyroid Function Tests:  Recent Labs  08/11/16 0551  TSH 1.879   Urine analysis:    Component Value Date/Time   COLORURINE YELLOW 08/11/2016 0040   APPEARANCEUR CLEAR  08/11/2016 0040   LABSPEC 1.019 08/11/2016 0040   PHURINE 5.0 08/11/2016 0040   GLUCOSEU NEGATIVE 08/11/2016 0040   HGBUR NEGATIVE 08/11/2016 0040   BILIRUBINUR NEGATIVE 08/11/2016 0040   KETONESUR NEGATIVE 08/11/2016 0040   PROTEINUR NEGATIVE 08/11/2016 0040   UROBILINOGEN 0.2 11/11/2010 1312   NITRITE NEGATIVE 08/11/2016 0040   LEUKOCYTESUR NEGATIVE 08/11/2016 0040   Recent Results (from the past 240 hour(s))  Blood Culture (routine x 2)     Status: None (Preliminary result)   Collection Time: 08/10/16 11:26 PM  Result Value Ref Range Status   Specimen Description BLOOD RIGHT CHEST  Final   Special Requests   Final    BOTTLES DRAWN AEROBIC AND ANAEROBIC Blood Culture adequate volume   Culture   Final    NO GROWTH 1 DAY Performed at [facility], [place] [address] [place], [place] [address]    Report Status PENDING  Incomplete  Blood Culture (routine x 2)     Status: None (Preliminary result)   Collection Time: 08/10/16 11:26 PM  Result Value Ref Range Status   Specimen Description BLOOD LEFT ANTECUBITAL  Final   Special Requests   Final    BOTTLES DRAWN AEROBIC AND ANAEROBIC Blood Culture adequate volume   Culture   Final    NO GROWTH 1 DAY Performed at [facility], [place] [address] [place], [place] [address]    Report Status PENDING  Incomplete  Urine culture     Status: None   Collection Time: 08/11/16 12:39 AM  Result Value Ref Range Status   Specimen Description URINE, RANDOM  Final   Special Requests NONE  Final   Culture   Final    NO GROWTH Performed at [facility], [place] [address], [place], [place] [address]    Report Status 08/12/2016 FINAL  Final    Studies: Ct Chest Wo Contrast  Result Date: 08/12/2016 CLINICAL DATA:  Cough and increased breath sounds in the bases bilaterally. EXAM: CT CHEST WITHOUT CONTRAST TECHNIQUE: Multidetector CT imaging of the chest was performed following the standard protocol without IV contrast. COMPARISON:   08/10/2016, 10/01/2015 FINDINGS: Cardiovascular: Somewhat limited by the lack of IV contrast. Aortic calcifications are identified without aneurysmal dilatation. Tortuosity of the descending thoracic aorta is seen. Heavy coronary calcifications are seen. No significant cardiac enlargement  is noted. A right chest wall port is seen in satisfactory position. Mediastinum/Nodes: No significant hilar or mediastinal adenopathy is identified. The thoracic inlet is within normal limits. No axillary adenopathy is seen. Lungs/Pleura: Small left pleural effusion is noted. Emphysematous changes are seen. Areas of chronic scarring are noted in both lungs stable from the previous exam. No acute infiltrate or focal nodule is seen. Upper Abdomen: Visualized upper abdomen is within normal limits. Musculoskeletal: Degenerative changes of the thoracic spine are noted. T10 and T12 compression deformities are noted. The T12 compression deformity is stable. The T10 compression deformity is new from 2017 but demonstrates increased sclerosis and is likely chronic in nature. IMPRESSION: Emphysematous changes with diffuse bilateral scarring stable from previous exams. No acute infiltrate is noted. Small left pleural effusion. T10 compression deformity which is new from the prior study of 2017 but has diffuse sclerosis identified and likely chronic in nature. Electronically Signed   By: Inez Catalina M.D.   On: 08/12/2016 14:49    Scheduled Meds: . aspirin EC  81 mg Oral Daily  . enoxaparin (LOVENOX) injection  40 mg Subcutaneous Q24H  . ferrous sulfate  325 mg Oral Q breakfast  . metoprolol succinate  25 mg Oral Daily  . simvastatin  40 mg Oral QPM   Continuous Infusions:   LOS: 2 days   Faye Ramsay, MD [facility] Pager [phone]  If 7PM-7AM, please contact night-coverage [email or web address] Password [email or web address] 08/13/2016, 11:51 AM

## 2016-08-13 NOTE — Clinical Social Work Note (Signed)
Clinical Social Work Assessment  Patient Details  Name: [name] [name] MRN: [id number] Date of Birth: 1940/12/09  Date of referral:  08/13/16               Reason for consult:  Facility Placement                Permission sought to share information with:  Chartered certified accountant granted to share information::  Yes, Verbal Permission Granted  Name::        Agency::     Relationship::     Contact Information:     Housing/Transportation Living arrangements for the past 2 months:  Single Family Home Source of Information:  Spouse Patient Interpreter Needed:  None Criminal Activity/Legal Involvement Pertinent to Current Situation/Hospitalization:    Significant Relationships:  Adult Children, Spouse Lives with:  Self Do you feel safe going back to the place where you live?  Yes Need for family participation in patient care:  Yes (Comment)  Care giving concerns:  Pt has dementia  Social Worker assessment / plan:  CSW spoke with pt's spouse [name] [name] at ph: [phone] and confirmed pt's plan to be discharged to SNF to live at discharge.  CSW provided active listening and validated pt's concerns that pt, if possible be placed near [place] or [place].   Pt's spouse gave CSW Dept permission to complete FL-2 and send referrals out to SNF facilities via the hub per pt's request in or near [place], [place], [place] and [place] areas or further if needed.  Pt has been living independently prior to being admitted to [facility].  Employment status:  Retired Nurse, adult PT Recommendations:  [place] / Referral to community resources:     Patient/Family's Response to care:  Patient not alert and oriented.  Patient's spouse greeable to plan.  Pt's spouse supportive and strongly involved in pt.'s care.  Pt.'s spouse pleasant and appreciated CSW intervention.  Patient/Family's Understanding of and Emotional Response to  Diagnosis, Current Treatment, and Prognosis:  Still assessing  Emotional Assessment Appearance:    Attitude/Demeanor/Rapport:  Unable to Assess Affect (typically observed):  Unable to Assess Orientation:  Fluctuating Orientation (Suspected and/or reported Sundowners) (Per notes Dementia) Alcohol / Substance use:    Psych involvement (Current and /or in the community):     Discharge Needs  Concerns to be addressed:  No discharge needs identified Readmission within the last 30 days:  No Current discharge risk:  None Barriers to Discharge:  No Barriers Identified   [name] [name], LCSWA 08/13/2016, 6:39 PM

## 2016-08-14 LAB — CBC
HCT: 39.8 % (ref 39.0–52.0)
HEMOGLOBIN: 13.7 g/dL (ref 13.0–17.0)
MCH: 31.8 pg (ref 26.0–34.0)
MCHC: 34.4 g/dL (ref 30.0–36.0)
MCV: 92.3 fL (ref 78.0–100.0)
Platelets: 247 10*3/uL (ref 150–400)
RBC: 4.31 MIL/uL (ref 4.22–5.81)
RDW: 13.3 % (ref 11.5–15.5)
WBC: 8.8 10*3/uL (ref 4.0–10.5)

## 2016-08-14 LAB — BASIC METABOLIC PANEL
Anion gap: 6 (ref 5–15)
BUN: 18 mg/dL (ref 6–20)
CALCIUM: 9 mg/dL (ref 8.9–10.3)
CHLORIDE: 97 mmol/L — AB (ref 101–111)
CO2: 27 mmol/L (ref 22–32)
CREATININE: 0.79 mg/dL (ref 0.61–1.24)
GFR calc non Af Amer: >60 mL/min (ref >60)
Glucose, Bld: 97 mg/dL (ref 65–99)
Potassium: 4.3 mmol/L (ref 3.5–5.1)
SODIUM: 130 mmol/L — AB (ref 135–145)

## 2016-08-14 MED ORDER — TRAMADOL HCL 50 MG PO TABS: 50.0000 mg | ORAL_TABLET | Freq: Four times a day (QID) | ORAL | 0 refills | Status: DC | PRN

## 2016-08-14 MED ORDER — METOPROLOL SUCCINATE ER 25 MG PO TB24: 25.0000 mg | ORAL_TABLET | Freq: Every day | ORAL | 0 refills | Status: AC

## 2016-08-14 MED ORDER — HEPARIN SOD (PORK) LOCK FLUSH 100 UNIT/ML IV SOLN
500.0000 [IU] | INTRAVENOUS | Status: AC | PRN
  Administered 2016-08-14: 500 [IU]

## 2016-08-14 NOTE — Progress Notes (Addendum)
Patient will discharge to [facility] Anticipated discharge date: 4/27 Family notified: wife at bedside Transportation by wife  Insurance auth received at [employer] informed they can release patient with wife ASAP- patient will need bring script  CSW signing off.  Jorge Ny, LCSW Clinical Social Worker [phone]

## 2016-08-14 NOTE — Discharge Instructions (Signed)
Confusion  Confusion is the inability to think with your usual speed or clarity. Confusion may come on quickly or slowly over time. How quickly the confusion comes on depends on the cause. Confusion can be due to any number of causes.  What are the causes?  · Concussion, head injury, or head trauma.  · Seizures.  · Stroke.  · Fever.  · Brain tumor.  · Age related decreased brain function (dementia).  · Heightened emotional states like rage or terror.  · Mental illness in which the person loses the ability to determine what is real and what is not (hallucinations).  · Infections such as a urinary tract infection (UTI).  · Toxic effects from alcohol, drugs, or prescription medicines.  · Dehydration and an imbalance of salts in the body (electrolytes).  · Lack of sleep.  · Low blood sugar (diabetes).  · Low levels of oxygen from conditions such as chronic lung disorders.  · Drug interactions or other medicine side effects.  · Nutritional deficiencies, especially niacin, thiamine, vitamin C, or vitamin B.  · Sudden drop in body temperature (hypothermia).  · Change in routine, such as when traveling or hospitalized.  What are the signs or symptoms?  People often describe their thinking as cloudy or unclear when they are confused. Confusion can also include feeling disoriented. That means you are unaware of where or who you are. You may also not know what the date or time is. If confused, you may also have difficulty paying attention, remembering, and making decisions. Some people also act aggressively when they are confused.  How is this diagnosed?  The medical evaluation of confusion may include:  · Blood and urine tests.  · X-rays.  · Brain and nervous system tests.  · Analyzing your brain waves (electroencephalogram or EEG).  · Magnetic resonance imaging (MRI) of your head.  · Computed tomography (CT) scan of your head.  · Mental status tests in which your health care provider may ask many questions. Some of these  questions may seem silly or strange, but they are a very important test to help diagnose and treat confusion.    How is this treated?  An admission to the hospital may not be needed, but a person with confusion should not be left alone. Stay with a family member or friend until the confusion clears. Avoid alcohol, pain relievers, or sedative drugs until you have fully recovered. Do not drive until directed by your health care provider.  Follow these instructions at home:  What family and friends can do:  · To find out if someone is confused, ask the person to state his or her name, age, and the date. If the person is unsure or answers incorrectly, he or she is confused.  · Always introduce yourself, no matter how well the person knows you.  · Often remind the person of his or her location.  · Place a calendar and clock near the confused person.  · Help the person with his or her medicines. You may want to use a pill box, an alarm as a reminder, or give the person each dose as prescribed.  · Talk about current events and plans for the day.  · Try to keep the environment calm, quiet, and peaceful.  · Make sure the person keeps follow-up visits with his or her health care provider.    How is this prevented?  Ways to prevent confusion:  · Avoid alcohol.  · Eat a balanced   diet.  · Get enough sleep.  · Take medicine only as directed by your health care provider.  · Do not become isolated. Spend time with other people and make plans for your days.  · Keep careful watch on your blood sugar levels if you are diabetic.    Get help right away if:  · You develop severe headaches, repeated vomiting, seizures, blackouts, or slurred speech.  · There is increasing confusion, weakness, numbness, restlessness, or personality changes.  · You develop a loss of balance, have marked dizziness, feel uncoordinated, or fall.  · You have delusions, hallucinations, or develop severe anxiety.  · Your family members think you need to be  rechecked.  This information is not intended to replace advice given to you by your health care provider. Make sure you discuss any questions you have with your health care provider.  Document Released: 05/14/2004 Document Revised: 10/25/2015 Document Reviewed: 05/12/2013  Elsevier Interactive Patient Education © 2017 [employer]

## 2016-08-14 NOTE — Clinical Social Work Note (Signed)
CSW extended bed offers and wife chose [facility].  CSW faxed information to request [facility] insurance authorization.  CSW will continue to follow and assist with d/c planning needs.  [place], [place]

## 2016-08-14 NOTE — Discharge Summary (Addendum)
Physician Discharge Summary  [name] [name] DJM:[id number] DOB: 03/05/1941 DOA: 08/10/2016  PCP: Hilaria Ota, MD  Admit date: 08/10/2016 Discharge date: 08/14/2016  Recommendations for Outpatient Follow-up:  1. Pt will need to follow up with PCP in 1-2 weeks post discharge 2. Please obtain BMP to evaluate electrolytes and kidney function 3. Please also check CBC to evaluate Hg and Hct levels 4. Please note that K supplement was stopped until blood work done, can be resumed if needed   Discharge Diagnoses:  Principal Problem:   Acute encephalopathy Active Problems:   Prostate carcinoma ([place])   Hypertension   Port catheter in place   SVT (supraventricular tachycardia) ([facility])   PSVT (paroxysmal supraventricular tachycardia) ([place])   H/O: GI bleed   RBBB (right bundle branch block with left anterior fascicular block)   AVB (atrioventricular block)  Discharge Condition: Stable  Diet recommendation: Heart healthy diet discussed in details   HPI/Recap of past 31 hours: 76 year old male past oral history of mild dementia and metastatic prostate cancer plus hypertension who was brought in by his wife after she noted that he was getting more confused over the past few days prior to this admission. Patient found to be in acute SVT in the emergency room however it resolved prior to any intervention. He has had another episode of SVT and cardiology was consulted.   Assessment/Plan: Principal Problem:   Acute metabolic encephalopathy with underlying senile dementia - unclear etiology and suspected to be due to SVT - There was also concern for RML lung PNA however antibiotics have been discontinued as there was no fevers, no leukocytosis and oxygen saturations have been stable - CT chest with no evidence of PNA - blood work stable and at baseline - d/w wife that this new intermittent confusion can be new baseline and may not get better - pt does look better this AM, stable for SNF when  bed available   Active Problems   Prostate carcinoma ([place]) - follows with Dr. Alen Blew, notified of pt's admission via EPIC    Hyponatremia - mild, appears to be pre renal - encouraged oral intake     Hypertension - reasonable inpatient control    Port catheter in place [facility] looks clean, no evidence of an acute infection.  Family Communication: Wife at the bedside  Disposition Plan: SNF  Consultants:  Cardiology   PT/OT  SW Procedures:  None.  Antimicrobials:  Given antibiotics on admission, discontinued at this time   Procedures/Studies: Dg Chest 2 View  Result Date: 08/10/2016 CLINICAL DATA:  Acute onset of shortness of breath and altered mental status. Initial encounter. EXAM: CHEST  2 VIEW COMPARISON:  Chest radiograph performed 03/01/2014, and CT of the chest performed 10/01/2015 FINDINGS: The lungs are hypoexpanded. Right midlung airspace opacity raises concern for pneumonia. There is no evidence of pleural effusion or pneumothorax. The heart is mildly enlarged. No acute osseous abnormalities are seen. Right rib hardware is noted. A right-sided chest port is noted ending about the proximal right atrium. IMPRESSION: 1. Lungs hypoexpanded. Right midlung airspace opacity raises concern for pneumonia. 2. Mild cardiomegaly. Electronically Signed   By: Garald Balding M.D.   On: 08/10/2016 23:50   Ct Head Wo Contrast  Result Date: 08/11/2016 CLINICAL DATA:  Increasing confusion and disorientation. History of prostate cancer and dementia. EXAM: CT HEAD WITHOUT CONTRAST TECHNIQUE: Contiguous axial images were obtained from the base of the skull through the vertex without intravenous contrast. COMPARISON:  MRI of the  brain from 10/28/2013 FINDINGS: BRAIN: There is mild-to-moderate sulcal and ventricular prominence consistent with superficial and central atrophy. No intraparenchymal hemorrhage, mass effect nor midline shift. Periventricular and subcortical white matter  hypodensities consistent with chronic small vessel ischemic disease are identified. No acute large vascular territory infarcts. No abnormal extra-axial fluid collections. Basal cisterns are not effaced and midline. VASCULAR: Moderate calcific atherosclerosis of the carotid siphons. SKULL: No skull fracture. None osteoblastic disease. No significant scalp soft tissue swelling. SINUSES/ORBITS: The mastoid air-cells are clear. The included paranasal sinuses are well-aerated.The included ocular globes and orbital contents are non-suspicious. OTHER: None. IMPRESSION: Cerebral atrophy with moderate degree of chronic small vessel ischemic disease involving the periventricular and subcortical white matter. No acute intracranial abnormality. Electronically Signed   By: Ashley Royalty M.D.   On: 08/11/2016 00:39   Ct Chest Wo Contrast  Result Date: 08/12/2016 CLINICAL DATA:  Cough and increased breath sounds in the bases bilaterally. EXAM: CT CHEST WITHOUT CONTRAST TECHNIQUE: Multidetector CT imaging of the chest was performed following the standard protocol without IV contrast. COMPARISON:  08/10/2016, 10/01/2015 FINDINGS: Cardiovascular: Somewhat limited by the lack of IV contrast. Aortic calcifications are identified without aneurysmal dilatation. Tortuosity of the descending thoracic aorta is seen. Heavy coronary calcifications are seen. No significant cardiac enlargement is noted. A right chest wall port is seen in satisfactory position. Mediastinum/Nodes: No significant hilar or mediastinal adenopathy is identified. The thoracic inlet is within normal limits. No axillary adenopathy is seen. Lungs/Pleura: Small left pleural effusion is noted. Emphysematous changes are seen. Areas of chronic scarring are noted in both lungs stable from the previous exam. No acute infiltrate or focal nodule is seen. Upper Abdomen: Visualized upper abdomen is within normal limits. Musculoskeletal: Degenerative changes of the thoracic spine  are noted. T10 and T12 compression deformities are noted. The T12 compression deformity is stable. The T10 compression deformity is new from 2017 but demonstrates increased sclerosis and is likely chronic in nature. IMPRESSION: Emphysematous changes with diffuse bilateral scarring stable from previous exams. No acute infiltrate is noted. Small left pleural effusion. T10 compression deformity which is new from the prior study of 2017 but has diffuse sclerosis identified and likely chronic in nature. Electronically Signed   By: Inez Catalina M.D.   On: 08/12/2016 14:49     Discharge Exam: Vitals:   08/13/16 2112 08/14/16 0540  BP: 110/70 108/71  Pulse: 74 62  Resp: 16 18  Temp: 98.2 F (36.8 C) 97.6 F (36.4 C)   Vitals:   08/13/16 0438 08/13/16 1500 08/13/16 2112 08/14/16 0540  BP: 122/89 104/74 110/70 108/71  Pulse: 65 79 74 62  Resp: 20 15 16 18   Temp: 98.2 F (36.8 C) 98.3 F (36.8 C) 98.2 F (36.8 C) 97.6 F (36.4 C)  TempSrc: Oral Oral Oral Oral  SpO2: 94% 97% 96% 98%  Weight:      Height:        General: Pt is alert, follows commands appropriately, not in acute distress Cardiovascular: Regular rate and rhythm, S1/S2 +, no murmurs, no rubs, no gallops Respiratory: Clear to auscultation bilaterally, no wheezing, no crackles, no rhonchi Abdominal: Soft, non tender, non distended, bowel sounds +, no guarding  Discharge Instructions   Allergies as of 08/14/2016      Reactions   Oxycodone Nausea And Vomiting   Penicillins Itching, Rash   Has patient had a PCN reaction causing immediate rash, facial/tongue/throat swelling, SOB or lightheadedness with hypotension: YES Has patient had a PCN  reaction causing severe rash involving mucus membranes or skin necrosis: NO Has patient had a PCN reaction that required hospitalization: NO Has patient had a PCN reaction occurring within the last 10 years: NO If all of the above answers are "NO", then may proceed with Cephalosporin use.       Medication List    STOP taking these medications   potassium chloride SA 20 MEQ tablet Commonly known as:  K-DUR,KLOR-CON     TAKE these medications   acetaminophen 325 MG tablet Commonly known as:  TYLENOL Take 650 mg by mouth every 6 (six) hours as needed for moderate pain.   aspirin EC 81 MG tablet Take 81 mg by mouth every morning.   cholecalciferol 1000 units tablet Commonly known as:  VITAMIN D Take 1,000 Units by mouth every morning.   CITRACAL + D PO Take 2 tablets by mouth every morning.   ferrous sulfate 325 (65 FE) MG tablet Take 325 mg by mouth daily with breakfast.   ibuprofen 200 MG tablet Commonly known as:  ADVIL,MOTRIN Take 200 mg by mouth every 4 (four) hours as needed for moderate pain.   lidocaine-prilocaine cream Commonly known as:  EMLA Apply 1 application topically as needed.   megestrol 40 MG/ML suspension Commonly known as:  MEGACE TAKE 2 TEASPOONFUL (10 ML) BY MOUTH TWICE DAILY (CASH PRICE $23.00 PER ADC) What changed:  See the new instructions.   metoprolol succinate 25 MG 24 hr tablet Commonly known as:  TOPROL-XL Take 1 tablet (25 mg total) by mouth daily. Start taking on:  08/15/2016   pantoprazole 40 MG tablet Commonly known as:  PROTONIX TAKE 1 TABLET BY MOUTH 30 MINUTES BEFORE BREAKFAST AND TAKE 1 TABLET BY MOUTH 30 MINUTES BEFORE SUPPER What changed:  See the new instructions.   prochlorperazine 10 MG tablet Commonly known as:  COMPAZINE Take 1 tablet (10 mg total) by mouth every 6 (six) hours as needed for nausea or vomiting.   simvastatin 40 MG tablet Commonly known as:  ZOCOR Take 40 mg by mouth every evening. Reported on 08/14/2015   traMADol 50 MG tablet Commonly known as:  ULTRAM Take 1 tablet (50 mg total) by mouth every 6 (six) hours as needed. What changed:  reasons to take this   ZYTIGA 250 MG tablet Generic drug:  abiraterone Acetate TAKE 4 TABLETS BY MOUTH DAILY. TAKE ON AN EMPTY STOMACH 1 HOUR BEFORE  OR 2 HOURS AFTER A MEAL What changed:  See the new instructions.      Follow-up Information    Hilaria Ota, MD Follow up.   Specialty:  Family Medicine Contact information: [place] [address] [phone]            The results of significant diagnostics from this hospitalization (including imaging, microbiology, ancillary and laboratory) are listed below for reference.     Microbiology: Recent Results (from the past 240 hour(s))  Blood Culture (routine x 2)     Status: None (Preliminary result)   Collection Time: 08/10/16 11:26 PM  Result Value Ref Range Status   Specimen Description BLOOD RIGHT CHEST  Final   Special Requests   Final    BOTTLES DRAWN AEROBIC AND ANAEROBIC Blood Culture adequate volume   Culture   Final    NO GROWTH 3 DAYS Performed at [facility], [address] [place], [place] [address]    Report Status PENDING  Incomplete  Blood Culture (routine x 2)  Status: None (Preliminary result)   Collection Time: 08/10/16 11:26 PM  Result Value Ref Range Status   Specimen Description BLOOD LEFT ANTECUBITAL  Final   Special Requests   Final    BOTTLES DRAWN AEROBIC AND ANAEROBIC Blood Culture adequate volume   Culture   Final    NO GROWTH 3 DAYS Performed at [facility], [address] [place], [place] [address]    Report Status PENDING  Incomplete  Urine culture     Status: None   Collection Time: 08/11/16 12:39 AM  Result Value Ref Range Status   Specimen Description URINE, RANDOM  Final   Special Requests NONE  Final   Culture   Final    NO GROWTH Performed at [facility], [place] [address], [place], [place] [address]    Report Status 08/12/2016 FINAL  Final     Labs: Basic Metabolic Panel:  Recent Labs Lab 08/10/16 2326 08/10/16 2348 08/11/16 0551 08/13/16 0516 08/14/16 0421  NA 136 139 137 130* 130*  K 3.7 3.7 3.6 4.0 4.3  CL 107 104 108 99* 97*  CO2 23  --  23 26 27   GLUCOSE 112* 109* 99 94  97  BUN 19 21* 16 13 18   CREATININE 1.08 1.10 0.80 0.77 0.79  CALCIUM 8.7*  --  8.1* 9.0 9.0  MG 1.9  --   --   --   --    Liver Function Tests:  Recent Labs Lab 08/10/16 2326 08/11/16 0551  AST 22 17  ALT 15* 13*  ALKPHOS 48 39  BILITOT 0.6 0.7  PROT 6.4* 5.4*  ALBUMIN 3.5 3.0*   CBC:  Recent Labs Lab 08/10/16 2326 08/10/16 2348 08/11/16 0551 08/13/16 0516 08/14/16 0421  WBC 11.2*  --  8.5 8.1 8.8  NEUTROABS 6.8  --  5.0  --   --   HGB 13.6 13.9 11.4* 13.6 13.7  HCT 40.4 41.0 34.1* 39.0 39.8  MCV 92.7  --  92.4 90.9 92.3  PLT 237  --  204 220 247   Cardiac Enzymes:  Recent Labs Lab 08/11/16 0551  TROPONINI <0.03    CBG:  Recent Labs Lab 08/10/16 2306  GLUCAP 125*    SIGNED: Time coordinating discharge: 30 minutes  Faye Ramsay, MD  [facility] 08/14/2016, 11:30 AM Pager [phone]  If 7PM-7AM, please contact night-coverage [email or web address] Password [email or web address]

## 2016-08-14 NOTE — Evaluation (Signed)
Occupational Therapy Evaluation Patient Details Name: [name] [name] MRN: [id number] DOB: May 16, 1940 Today's Date: 08/14/2016    History of Present Illness 76 yo male admitted with acute encephalopathy, Pna, SVT. hx of prostate ca with mets, dementia.    Clinical Impression   Pt was admitted for the above.  At baseline, he uses a quad cane and has assist with bathing and dressing.  Pt currently needs min to mod A for grooming and mostly max A for other ADLs. +2 assistance is needed for safety for sit to stand. Goals in acute are for min to mod A.  Pt will benefit from continued OT at SNF to work on strength, endurance and balance so that wife can safely assist at home.    Follow Up Recommendations  SNF    Equipment Recommendations  3 in 1 bedside commode    Recommendations for Other Services       Precautions / Restrictions Precautions Precautions: Fall Restrictions Weight Bearing Restrictions: No      Mobility Bed Mobility         Supine to sit: Min assist Sit to supine: Min guard   General bed mobility comments: light assistance for trunk when sitting up  Transfers                      Balance                                           ADL either performed or assessed with clinical judgement   ADL Overall ADL's : Needs assistance/impaired     Grooming: Wash/dry face;Sitting;Moderate assistance;Minimal assistance;Wash/dry hands   Upper Body Bathing: Maximal assistance;Sitting   Lower Body Bathing: Maximal assistance;Sit to/from stand;+2 for safety/equipment   Upper Body Dressing : Sitting;Maximal assistance   Lower Body Dressing: Total assistance;Sit to/from stand;+2 for safety/equipment                 General ADL Comments: pt sat EOB for 5 minutes then requested back to bed.  Pleasant and cooperative.  Cues for thoroughness.  Per chart, he has assistance for bathing and dressing at baseline.  Pt repetitively asked where  wife was.      Vision         Perception     Praxis      Pertinent Vitals/Pain Pain Assessment: No/denies pain     Hand Dominance     Extremity/Trunk Assessment Upper Extremity Assessment Upper Extremity Assessment: Generalized weakness       Cervical / Trunk Assessment Cervical / Trunk Assessment: Kyphotic   Communication Communication Communication: No difficulties   Cognition Arousal/Alertness: Awake/alert Behavior During Therapy: WFL for tasks assessed/performed Overall Cognitive Status: History of cognitive impairments - at baseline                                     General Comments       Exercises     Shoulder Instructions      Home Living Family/patient expects to be discharged to:: Skilled nursing facility                                 Additional Comments: lives with wife  Prior Functioning/Environment          Comments: per chart, assistance with bathing and dressing and pt used a RW. Family not present at time of eval        OT Problem List: Decreased strength;Decreased activity tolerance;Impaired balance (sitting and/or standing);Decreased cognition;Decreased safety awareness;Decreased knowledge of use of DME or AE      OT Treatment/Interventions: Self-care/ADL training;DME and/or AE instruction;Patient/family education;Balance training;Therapeutic activities;Cognitive remediation/compensation    OT Goals(Current goals can be found in the care plan section) Acute Rehab OT Goals Patient Stated Goal: none stated OT Goal Formulation: Patient unable to participate in goal setting Time For Goal Achievement: 08/21/16 Potential to Achieve Goals: Good ADL Goals Pt Will Perform Grooming: with supervision;sitting Pt Will Transfer to Toilet: with min assist;with +2 assist;stand pivot transfer;bedside commode Pt Will Perform Toileting - Clothing Manipulation and hygiene: with mod assist;sit to/from  stand Additional ADL Goal #1: pt will tolerate 25 minutes activity with 2 rest breaks  OT Frequency: Min 2X/week   Barriers to D/C:            Co-evaluation              End of Session Nurse Communication:  (RN assisted pulling pt up in bed)  Activity Tolerance: Patient limited by fatigue Patient left: in bed;with call bell/phone within reach;with bed alarm set  OT Visit Diagnosis: Unsteadiness on feet (R26.81);Muscle weakness (generalized) (M62.81)                Time: 1100-1115 OT Time Calculation (min): 15 min Charges:  OT General Charges $OT Visit: 1 Procedure OT Evaluation $OT Eval Low Complexity: 1 Procedure G-Codes:     [place], OTR/L [phone] 08/14/2016  [name] 08/14/2016, 12:28 PM

## 2016-08-14 NOTE — NC FL2 (Signed)
[place] LEVEL OF CARE SCREENING TOOL     IDENTIFICATION  Patient Name: [name] [name] Birthdate: February 22, 1941 Sex: male Admission Date (Current Location): 08/10/2016  [facility] and [place] Number:  [employer] and Address:  [facility] [place] [place] [address], [place], [place] [address]      Provider Number: [id number]  Attending Physician Name and Address:  Theodis Blaze, MD  Relative Name and Phone Number:       Current Level of Care: Hospital Recommended Level of Care: [place] Prior Approval Number:    Date Approved/Denied:   PASRR Number: [id number]  Discharge Plan: SNF    Current Diagnoses: Patient Active Problem List   Diagnosis Date Noted  . RBBB (right bundle branch block with left anterior fascicular block)   . AVB (atrioventricular block)   . SVT (supraventricular tachycardia) ([place]) 08/11/2016  . Acute encephalopathy 08/11/2016  . PSVT (paroxysmal supraventricular tachycardia) ([place]) 07/19/2016  . Port catheter in place 08/13/2015  . Upper GI bleed 03/14/2014  . Acute blood loss anemia 03/14/2014  . H/O: GI bleed 04/20/2013  . Prostate carcinoma ([place])   . Hyperlipidemia   . Hypertension   . 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    Orientation RESPIRATION BLADDER Height & Weight     Self  Normal Incontinent Weight: 156 lb 4.9 oz (70.9 kg) Height:  5\' 6"  (167.6 cm)  BEHAVIORAL SYMPTOMS/MOOD NEUROLOGICAL BOWEL NUTRITION STATUS      Continent Diet (see DC summary)  AMBULATORY STATUS COMMUNICATION OF NEEDS Skin   Extensive Assist Verbally Normal                       Personal Care Assistance Level of Assistance  Bathing, Dressing Bathing Assistance: Maximum assistance   Dressing Assistance: Maximum assistance     Functional Limitations Info             SPECIAL CARE FACTORS FREQUENCY  PT (By licensed PT), OT (By licensed OT)     PT Frequency: 5/wk OT Frequency: 5/wk             Contractures      Additional Factors Info  Code Status, Allergies Code Status Info: DNR Allergies Info: Oxycodone, Penicillins           Current Medications (08/14/2016):  This is the current hospital active medication list Current Facility-Administered Medications  Medication Dose Route Frequency Provider Last Rate Last Dose  . acetaminophen (TYLENOL) tablet 650 mg  650 mg Oral Q6H PRN Rise Patience, MD   650 mg at 08/14/16 0551   Or  . acetaminophen (TYLENOL) suppository 650 mg  650 mg Rectal Q6H PRN Rise Patience, MD      . aspirin EC tablet 81 mg  81 mg Oral Daily Rise Patience, MD   81 mg at 08/14/16 1010  . enoxaparin (LOVENOX) injection 40 mg  40 mg Subcutaneous Q24H Rise Patience, MD   40 mg at 08/14/16 1010  . ferrous sulfate tablet 325 mg  325 mg Oral Q breakfast Rise Patience, MD   325 mg at 08/14/16 0820  . metoprolol (LOPRESSOR) injection 5 mg  5 mg Intravenous Q6H PRN Annita Brod, MD      . metoprolol succinate (TOPROL-XL) 24 hr tablet 25 mg  25 mg Oral Daily Erlene Quan, PA-C   25 mg at 08/14/16 1010  . simvastatin (ZOCOR) tablet 40 mg  40  mg Oral QPM Rise Patience, MD   40 mg at 08/13/16 1836  . sodium chloride flush (NS) 0.9 % injection 10-40 mL  10-40 mL Intracatheter PRN Rise Patience, MD   10 mL at 08/14/16 0420     Discharge Medications: Please see discharge summary for a list of discharge medications.  Relevant Imaging Results:  Relevant Lab Results:   Additional Information SS#: [id number]  Jorge Ny, LCSW

## 2016-08-16 LAB — CULTURE, BLOOD (ROUTINE X 2)
Culture: NO GROWTH
Culture: NO GROWTH
SPECIAL REQUESTS: ADEQUATE
Special Requests: ADEQUATE

## 2016-08-21 ENCOUNTER — Ambulatory Visit (HOSPITAL_BASED_OUTPATIENT_CLINIC_OR_DEPARTMENT_OTHER): Payer: Medicare Other | Admitting: Oncology

## 2016-08-21 ENCOUNTER — Telehealth: Payer: Self-pay | Admitting: Oncology

## 2016-08-21 ENCOUNTER — Ambulatory Visit (HOSPITAL_BASED_OUTPATIENT_CLINIC_OR_DEPARTMENT_OTHER): Payer: Medicare Other

## 2016-08-21 ENCOUNTER — Other Ambulatory Visit (HOSPITAL_BASED_OUTPATIENT_CLINIC_OR_DEPARTMENT_OTHER): Payer: Medicare Other

## 2016-08-21 VITALS — BP 116/65 | HR 78 | Temp 98.3°F | Resp 17 | Ht 66.0 in | Wt 152.4 lb

## 2016-08-21 DIAGNOSIS — E291 Testicular hypofunction: Secondary | ICD-10-CM

## 2016-08-21 DIAGNOSIS — C7951 Secondary malignant neoplasm of bone: Secondary | ICD-10-CM

## 2016-08-21 DIAGNOSIS — C61 Malignant neoplasm of prostate: Secondary | ICD-10-CM

## 2016-08-21 DIAGNOSIS — Z95828 Presence of other vascular implants and grafts: Secondary | ICD-10-CM

## 2016-08-21 DIAGNOSIS — I10 Essential (primary) hypertension: Secondary | ICD-10-CM | POA: Diagnosis not present

## 2016-08-21 LAB — CBC WITH DIFFERENTIAL/PLATELET
BASO%: 0.6 % (ref 0.0–2.0)
BASOS ABS: 0 10*3/uL (ref 0.0–0.1)
EOS%: 1.3 % (ref 0.0–7.0)
Eosinophils Absolute: 0.1 10*3/uL (ref 0.0–0.5)
HCT: 37.3 % — ABNORMAL LOW (ref 38.4–49.9)
HGB: 12.6 g/dL — ABNORMAL LOW (ref 13.0–17.1)
LYMPH%: 24.3 % (ref 14.0–49.0)
MCH: 31.9 pg (ref 27.2–33.4)
MCHC: 33.7 g/dL (ref 32.0–36.0)
MCV: 94.8 fL (ref 79.3–98.0)
MONO#: 0.7 10*3/uL (ref 0.1–0.9)
MONO%: 7.9 % (ref 0.0–14.0)
NEUT#: 5.5 10*3/uL (ref 1.5–6.5)
NEUT%: 65.9 % (ref 39.0–75.0)
PLATELETS: 285 10*3/uL (ref 140–400)
RBC: 3.94 10*6/uL — ABNORMAL LOW (ref 4.20–5.82)
RDW: 13.7 % (ref 11.0–14.6)
WBC: 8.4 10*3/uL (ref 4.0–10.3)
lymph#: 2 10*3/uL (ref 0.9–3.3)

## 2016-08-21 LAB — COMPREHENSIVE METABOLIC PANEL
ALBUMIN: 3.5 g/dL (ref 3.5–5.0)
ALK PHOS: 62 U/L (ref 40–150)
ALT: 18 U/L (ref 0–55)
ANION GAP: 8 meq/L (ref 3–11)
AST: 19 U/L (ref 5–34)
BILIRUBIN TOTAL: 0.83 mg/dL (ref 0.20–1.20)
BUN: 21 mg/dL (ref 7.0–26.0)
CALCIUM: 9.5 mg/dL (ref 8.4–10.4)
CO2: 24 mEq/L (ref 22–29)
Chloride: 102 mEq/L (ref 98–109)
Creatinine: 1 mg/dL (ref 0.7–1.3)
EGFR: 77 mL/min/{1.73_m2} — AB (ref >90)
GLUCOSE: 113 mg/dL (ref 70–140)
POTASSIUM: 4.1 meq/L (ref 3.5–5.1)
SODIUM: 135 meq/L — AB (ref 136–145)
TOTAL PROTEIN: 6.8 g/dL (ref 6.4–8.3)

## 2016-08-21 MED ORDER — HEPARIN SOD (PORK) LOCK FLUSH 100 UNIT/ML IV SOLN
500.0000 [IU] | Freq: Once | INTRAVENOUS | Status: AC | PRN
  Administered 2016-08-21: 500 [IU] via INTRAVENOUS
  Filled 2016-08-21: qty 5

## 2016-08-21 MED ORDER — SODIUM CHLORIDE 0.9 % IJ SOLN
10.0000 mL | INTRAMUSCULAR | Status: DC | PRN
  Administered 2016-08-21: 10 mL via INTRAVENOUS
  Filled 2016-08-21: qty 10

## 2016-08-21 NOTE — Telephone Encounter (Signed)
Appointments scheduled per 5.4.18 LOS. Patient given AVS report and calendars with future scheduled appointments. °

## 2016-08-21 NOTE — Progress Notes (Signed)
Hematology and Oncology Follow Up Visit  [name] [name] [id number] [birth year] 76 y.o. 08/21/2016 4:02 PM   Principle Diagnosis: 76 year old man with castration resistant metastatic prostate cancer with disease to the bone. His initial diagnosis was in 2012 where he presented with metastatic disease to the rib. His PSA was 187.   Prior Therapy:  He was found to have a mass in the right axilla near the fourth rib encasing the right with some sclerosis. He underwent a right VATS procedure and resection of the third, fourth and fifth rib and reconstruction with titanium plates under the care of Dr. Arlyce Dice on November 13 2010.  He went on to receive androgen deprivation therapy under the care of Dr. Gaynelle Arabian. His PSA nadir was to 0.1 back in May of 2013 and subsequently started to rise.  He was treated with Provenge in the fall of 2014.  After his PSA went up to 178.6 and Xtandi was started in February of 2015 and discontinued in 09/2013 due to progression of disease and poor tolerance. Taxotere chemotherapy started on 10/05/2013. He is status post 16 cycles of therapy last one given in April 2016 with a PSA reduction down to 0.44.   Current therapy: Zytiga 1000 mg daily started in June 2017.   Interim History:  [name] [name] presents today for a followup visit with his wife. Since his last visit, he was hospitalized between 08/10/2016 and 08/14/2016 for altered mental status. The etiology might be related to infectious episode versus cardiac arrhythmia. He appears to have recovered reasonably well but still resides in a skilled nursing facility receiving rehabilitation. He still has issues with dementia which remained stable as of late but have deteriorated over the last few months.  He has resumed Zytiga after his discharge after an option for 4 days. He denied any complications associated with this medication. He denied any nausea, fatigue excessive tiredness. He denied any diarrhea or edema. He is  participating in physical therapy and feels that he is getting stronger. His appetite is maintained and his quality of life is unchanged. He denied any bone pain or pathological fractures.    He does not report any headaches or blurry vision or double vision. Has not reported any syncope or change in his mental status. He is not reporting any chest pain or shortness of breath.  Does not report any palpitation or leg edema. Does not report any cough, hemoptysis or wheezing. Does not report any frequency urgency or hesitancy. Does not report any skeletal complications. Does not report any lymphadenopathy or petechiae. Remainder of his review of systems unremarkable.  Medications: I have reviewed the patient's current medications.  Current Outpatient Prescriptions  Medication Sig Dispense Refill  . acetaminophen (TYLENOL) 325 MG tablet Take 650 mg by mouth every 6 (six) hours as needed for moderate pain.     Marland Kitchen aspirin EC 81 MG tablet Take 81 mg by mouth every morning.     . Calcium Citrate-Vitamin D (CITRACAL + D PO) Take 2 tablets by mouth every morning.     . cholecalciferol (VITAMIN D) 1000 units tablet Take 1,000 Units by mouth every morning.     . ferrous sulfate 325 (65 FE) MG tablet Take 325 mg by mouth daily with breakfast.    . ibuprofen (ADVIL,MOTRIN) 200 MG tablet Take 200 mg by mouth every 4 (four) hours as needed for moderate pain.    Marland Kitchen lidocaine-prilocaine (EMLA) cream Apply 1 application topically as needed. 30 g 2  .  megestrol (MEGACE) 40 MG/ML suspension TAKE 2 TEASPOONFUL (10 ML) BY MOUTH TWICE DAILY (CASH PRICE $23.00 PER ADC) (Patient taking differently: TAKE 2 TEASPOONFUL (10 ML) BY MOUTH ONCE DAILY (CASH PRICE $23.00 PER ADC)) 240 mL 1  . metoprolol succinate (TOPROL-XL) 25 MG 24 hr tablet Take 1 tablet (25 mg total) by mouth daily. 30 tablet 0  . pantoprazole (PROTONIX) 40 MG tablet TAKE 1 TABLET BY MOUTH 30 MINUTES BEFORE BREAKFAST AND TAKE 1 TABLET BY MOUTH 30 MINUTES BEFORE  SUPPER (Patient taking differently: TAKE 40 MG BY MOUTH 30 MINUTES BEFORE BREAKFAST AND TAKE 1 TABLET BY MOUTH 30 MINUTES BEFORE SUPPER) 60 tablet 11  . prochlorperazine (COMPAZINE) 10 MG tablet Take 1 tablet (10 mg total) by mouth every 6 (six) hours as needed for nausea or vomiting. 30 tablet 0  . simvastatin (ZOCOR) 40 MG tablet Take 40 mg by mouth every evening. Reported on 08/14/2015    . traMADol (ULTRAM) 50 MG tablet Take 1 tablet (50 mg total) by mouth every 6 (six) hours as needed. 30 tablet 0  . ZYTIGA 250 MG tablet TAKE 4 TABLETS BY MOUTH DAILY. TAKE ON AN EMPTY STOMACH 1 HOUR BEFORE OR 2 HOURS AFTER A MEAL (Patient taking differently: TAKE 1000 MG BY MOUTH DAILY. TAKE ON AN EMPTY STOMACH 1 HOUR BEFORE OR 2 HOURS AFTER A MEAL) 120 tablet 0   No current facility-administered medications for this visit.      Allergies:  Allergies  Allergen Reactions  . Oxycodone Nausea And Vomiting  . Penicillins Itching and Rash    Has patient had a PCN reaction causing immediate rash, facial/tongue/throat swelling, SOB or lightheadedness with hypotension: YES Has patient had a PCN reaction causing severe rash involving mucus membranes or skin necrosis: NO Has patient had a PCN reaction that required hospitalization: NO Has patient had a PCN reaction occurring within the last 10 years: NO If all of the above answers are "NO", then may proceed with Cephalosporin use.     Past Medical History, Surgical history, Social history, and Family History were reviewed and updated.   Physical Exam: Blood pressure 116/65, pulse 78, temperature 98.3 F (36.8 C), temperature source Oral, resp. rate 17, height 5\' 6"  (1.676 m), weight 152 lb 6.4 oz (69.1 kg), SpO2 97 %. ECOG:  1 General appearance: Alert, awake gentleman without distress. Head: Normocephalic, without obvious abnormality no oral thrush or ulcers. Neck: no adenopathy or tenderness. Lymph nodes: Cervical, supraclavicular, and axillary nodes  normal. Heart:regular rate and rhythm, S1, S2 normal, no murmur, click, rub or gallop  Chest wall examination revealed Port-A-Cath site without erythema or induration. Lung:chest clear, no wheezing, rales, normal symmetric air entry.  Abdomin: soft, non-tender, without masses or organomegaly no rebound or guarding. EXT: Trace edema noted. Neurological: No deficits noted.   Lab Results: Lab Results  Component Value Date   WBC 8.4 08/21/2016   HGB 12.6 (L) 08/21/2016   HCT 37.3 (L) 08/21/2016   MCV 94.8 08/21/2016   PLT 285 08/21/2016     Chemistry      Component Value Date/Time   NA 135 (L) 08/21/2016 1450   K 4.1 08/21/2016 1450   CL 97 (L) 08/14/2016 0421   CO2 24 08/21/2016 1450   BUN 21.0 08/21/2016 1450   CREATININE 1.0 08/21/2016 1450      Component Value Date/Time   CALCIUM 9.5 08/21/2016 1450   ALKPHOS 62 08/21/2016 1450   AST 19 08/21/2016 1450   ALT 18 08/21/2016  1450   BILITOT 0.83 08/21/2016 1450      Impression and Plan:  76 year old gentleman with the following issues:  1. 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 After progressing on Xtandi, he received systemic chemotherapy in the form of Taxotere between June 2015 till April 2016 for a total of 16 cycles. His PSA dropped down to 0.44 from 176.  His PSA increased up to 6.8 in June 2017 with bony metastasis that have been relatively stable. He started on Zytiga in June 2017.  He continues to tolerate this medication reasonably well with improvement in his quality of life and performance status.    PSA and bone scan in March 2018 showed stable disease.  Risks and benefits of continuing this medication were reviewed today and is agreeable to continue.   2. 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 Pain has not been an issue at this time.  3. Hypertension: His blood pressure have been within normal range on Zytiga.  4. IV access:  Port-A-Cath will be flushed every 6 weeks.  5. Androgen depravation: His testosterone level in October 2017 was less than 3. This will be repeated in the future to ensure castration levels.  6. Weight loss: Appetite and weight appears to be stable at this time.  7. Followup: Will be in 6 weeks for a follow-up visit.   Zola Button, MD  5/4/20184:02 PM

## 2016-08-24 ENCOUNTER — Telehealth: Payer: Self-pay | Admitting: *Deleted

## 2016-08-24 LAB — PSA: PROSTATE SPECIFIC AG, SERUM: 8.8 ng/mL — AB (ref 0.0–4.0)

## 2016-08-24 LAB — TESTOSTERONE: Testosterone, Serum: <3 ng/dL — ABNORMAL LOW (ref 264–916)

## 2016-08-24 NOTE — Telephone Encounter (Signed)
Spoke with wife. Per dr Alen Blew,  PSA has gone up slightly, but keep taking Zytiga for now.

## 2016-08-24 NOTE — Telephone Encounter (Signed)
-----   Message from Wyatt Portela, MD sent at 08/24/2016 10:04 AM EDT ----- Please let him (his wife) his PSA is slightly higher but no change for now.

## 2016-09-01 ENCOUNTER — Other Ambulatory Visit: Payer: Self-pay | Admitting: Oncology

## 2016-09-01 MED FILL — ZYTIGA 250 MG TABLET: 250 | 30 days supply | Qty: 120 | Fill #0

## 2016-09-12 ENCOUNTER — Other Ambulatory Visit: Payer: Self-pay | Admitting: Oncology

## 2016-09-12 DIAGNOSIS — C61 Malignant neoplasm of prostate: Secondary | ICD-10-CM

## 2016-09-15 ENCOUNTER — Other Ambulatory Visit: Payer: Self-pay | Admitting: Oncology

## 2016-09-15 DIAGNOSIS — C61 Malignant neoplasm of prostate: Secondary | ICD-10-CM

## 2016-09-22 ENCOUNTER — Other Ambulatory Visit: Payer: Self-pay | Admitting: Oncology

## 2016-10-05 ENCOUNTER — Ambulatory Visit (INDEPENDENT_AMBULATORY_CARE_PROVIDER_SITE_OTHER): Payer: Medicare Other | Admitting: Internal Medicine

## 2016-10-05 ENCOUNTER — Encounter (INDEPENDENT_AMBULATORY_CARE_PROVIDER_SITE_OTHER): Payer: Self-pay | Admitting: Internal Medicine

## 2016-10-05 VITALS — BP 118/79 | HR 72 | Temp 97.7°F | Ht 68.0 in | Wt 149.4 lb

## 2016-10-05 DIAGNOSIS — K219 Gastro-esophageal reflux disease without esophagitis: Secondary | ICD-10-CM

## 2016-10-05 MED FILL — ZYTIGA 250 MG TABLET: 250 | 30 days supply | Qty: 120 | Fill #0

## 2016-10-05 NOTE — Progress Notes (Signed)
Subjective:    Patient ID: [name] [name], male    DOB: 11-02-1940, 76 y.o.   MRN: [id number]  HPI Here today for f/u. Last seen in October of 2017.  Hx of upper GI bleed in 2015. EGD revealed Barrett's esophagus, ulcerative reflux esophagitis and moderate sliding hiatal hernia. No biopsies. He received 2 units of PRBC while in hospital.  He states he is doing good. Wife states he has been burping frequently and has given him charcoal tabs which have helped. Appetite is good. No weight loss. He has actually gained about 9 pounds.  Has a BM daily. No melena or BRRB.   Takes Zytiga 250mg  4 tabs daily for his prostrate cancer. Dx 6 yrs ago.   54/65/0354 EGD Complications:  None  Impression: 5 cm long tubular Barrett's segment with multiple benign appearing ulcers felt to be source of patient's upper GI bleed. No active bleeding noted. Moderate-sized sliding hiatal hernia.   Review of Systems Past Medical History:  Diagnosis Date  . Anxiety   . 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 Barrett's esophageal ulceration 2015  . Fatigue   . H/O: GI bleed 2015  . Hyperlipidemia   . Hypertension    denies, has had hypotension  . Metastatic bone tumor ([place]) 11/13/2010   RT VAT , RESECTION OF 3rd,4th ,5th ribs with reconstruction with allograft and titanium platesDR. BURNEY  . Prostate carcinoma ([place])   . PSVT (paroxysmal supraventricular tachycardia) ([place]) 07/2016  . RBBB (right bundle branch block with left anterior fascicular block)   . Tachycardia    history of    Past Surgical History:  Procedure Laterality Date  . DENTAL SURGERY    . ESOPHAGOGASTRODUODENOSCOPY N/A 03/14/2014   Procedure: ESOPHAGOGASTRODUODENOSCOPY (EGD);  Surgeon: Rogene Houston, MD;  Location: [facility];  Service: Endoscopy;  Laterality: N/A;  . RESECTION BONE TUMOR RIB  [id number]   RT VAT , RESECTION OF 3rd,4th ,5th ribs with reconstruction with allograft and titanium platesDR. BURNEY     Allergies  Allergen Reactions  . Oxycodone Nausea And Vomiting  . Penicillins Itching and Rash    Has patient had a PCN reaction causing immediate rash, facial/tongue/throat swelling, SOB or lightheadedness with hypotension: YES Has patient had a PCN reaction causing severe rash involving mucus membranes or skin necrosis: NO Has patient had a PCN reaction that required hospitalization: NO Has patient had a PCN reaction occurring within the last 10 years: NO If all of the above answers are "NO", then may proceed with Cephalosporin use.     Current Outpatient Prescriptions on File Prior to Visit  Medication Sig Dispense Refill  . acetaminophen (TYLENOL) 325 MG tablet Take 650 mg by mouth every 6 (six) hours as needed for moderate pain.     Marland Kitchen aspirin EC 81 MG tablet Take 81 mg by mouth every morning.     . Calcium Citrate-Vitamin D (CITRACAL + D PO) Take 2 tablets by mouth every morning.     . cholecalciferol (VITAMIN D) 1000 units tablet Take 1,000 Units by mouth every morning.     . ferrous sulfate 325 (65 FE) MG tablet Take 325 mg by mouth daily with breakfast.    . ibuprofen (ADVIL,MOTRIN) 200 MG tablet Take 200 mg by mouth every 4 (four) hours as needed for moderate pain.    Marland Kitchen lidocaine-prilocaine (EMLA) cream Apply 1 application topically as needed. 30 g 2  . megestrol (MEGACE) 40 MG/ML suspension TAKE 2  TEASPOONFUL (10 ML) BY MOUTH TWICE DAILY (CASH PRICE $23.00 PER ADC) 240 mL 0  . metoprolol succinate (TOPROL-XL) 25 MG 24 hr tablet Take 1 tablet (25 mg total) by mouth daily. 30 tablet 0  . pantoprazole (PROTONIX) 40 MG tablet TAKE 1 TABLET BY MOUTH 30 MINUTES BEFORE BREAKFAST AND TAKE 1 TABLET BY MOUTH 30 MINUTES BEFORE SUPPER (Patient taking differently: TAKE 40 MG BY MOUTH 30 MINUTES BEFORE BREAKFAST AND TAKE 1 TABLET BY MOUTH 30 MINUTES BEFORE SUPPER) 60 tablet 11  . potassium chloride SA (K-DUR,KLOR-CON) 20 MEQ tablet TAKE 1 TABLET BY MOUTH DAILY 30 tablet 0  .  prochlorperazine (COMPAZINE) 10 MG tablet Take 1 tablet (10 mg total) by mouth every 6 (six) hours as needed for nausea or vomiting. 30 tablet 0  . simvastatin (ZOCOR) 40 MG tablet Take 40 mg by mouth every evening. Reported on 08/14/2015    . traMADol (ULTRAM) 50 MG tablet Take 1 tablet (50 mg total) by mouth every 6 (six) hours as needed. 30 tablet 0  . ZYTIGA 250 MG tablet TAKE 4 TABLETS BY MOUTH DAILY. TAKE ON AN EMPTY STOMACH 1 HOUR BEFORE OR 2 HOURS AFTER A MEAL 120 tablet 0   No current facility-administered medications on file prior to visit.         Objective:   Physical Exam Blood pressure 118/79, pulse 72, temperature 97.7 F (36.5 C), height 5\' 8"  (1.727 m), weight 149 lb 6.4 oz (67.8 kg). Alert and oriented. Skin warm and dry. Oral mucosa is moist.   . Sclera anicteric, conjunctivae is pink. Thyroid not enlarged. No cervical lymphadenopathy. Lungs clear. Heart regular rate and rhythm.  Abdomen is soft. Bowel sounds are positive. No hepatomegaly. No abdominal masses felt. No tenderness.  No edema to lower extremities.           Assessment & Plan:  GERD. He is doing well. He will continue the Protonix. OV in 1 year.

## 2016-10-05 NOTE — Patient Instructions (Signed)
One year

## 2016-10-06 ENCOUNTER — Other Ambulatory Visit (HOSPITAL_BASED_OUTPATIENT_CLINIC_OR_DEPARTMENT_OTHER): Payer: Medicare Other

## 2016-10-06 ENCOUNTER — Telehealth: Payer: Self-pay | Admitting: Oncology

## 2016-10-06 ENCOUNTER — Ambulatory Visit (HOSPITAL_BASED_OUTPATIENT_CLINIC_OR_DEPARTMENT_OTHER): Payer: Medicare Other | Admitting: Oncology

## 2016-10-06 VITALS — BP 126/70 | HR 70 | Temp 97.5°F | Resp 17 | Ht 68.0 in | Wt 150.2 lb

## 2016-10-06 DIAGNOSIS — C7951 Secondary malignant neoplasm of bone: Secondary | ICD-10-CM | POA: Diagnosis not present

## 2016-10-06 DIAGNOSIS — E291 Testicular hypofunction: Secondary | ICD-10-CM | POA: Diagnosis not present

## 2016-10-06 DIAGNOSIS — C61 Malignant neoplasm of prostate: Secondary | ICD-10-CM

## 2016-10-06 DIAGNOSIS — I10 Essential (primary) hypertension: Secondary | ICD-10-CM

## 2016-10-06 DIAGNOSIS — R9721 Rising PSA following treatment for malignant neoplasm of prostate: Secondary | ICD-10-CM

## 2016-10-06 LAB — CBC WITH DIFFERENTIAL/PLATELET
BASO%: 0.8 % (ref 0.0–2.0)
BASOS ABS: 0.1 10*3/uL (ref 0.0–0.1)
EOS ABS: 0.1 10*3/uL (ref 0.0–0.5)
EOS%: 1.2 % (ref 0.0–7.0)
HCT: 40.1 % (ref 38.4–49.9)
HGB: 13.3 g/dL (ref 13.0–17.1)
LYMPH%: 26.4 % (ref 14.0–49.0)
MCH: 31.9 pg (ref 27.2–33.4)
MCHC: 33.2 g/dL (ref 32.0–36.0)
MCV: 96.1 fL (ref 79.3–98.0)
MONO#: 0.7 10*3/uL (ref 0.1–0.9)
MONO%: 8.4 % (ref 0.0–14.0)
NEUT%: 63.2 % (ref 39.0–75.0)
NEUTROS ABS: 5.4 10*3/uL (ref 1.5–6.5)
Platelets: 278 10*3/uL (ref 140–400)
RBC: 4.18 10*6/uL — AB (ref 4.20–5.82)
RDW: 14.1 % (ref 11.0–14.6)
WBC: 8.5 10*3/uL (ref 4.0–10.3)
lymph#: 2.2 10*3/uL (ref 0.9–3.3)

## 2016-10-06 LAB — COMPREHENSIVE METABOLIC PANEL
ALT: 15 U/L (ref 0–55)
AST: 18 U/L (ref 5–34)
Albumin: 3.8 g/dL (ref 3.5–5.0)
Alkaline Phosphatase: 61 U/L (ref 40–150)
Anion Gap: 9 mEq/L (ref 3–11)
BUN: 17.8 mg/dL (ref 7.0–26.0)
CHLORIDE: 108 meq/L (ref 98–109)
CO2: 27 meq/L (ref 22–29)
Calcium: 9.6 mg/dL (ref 8.4–10.4)
Creatinine: 1 mg/dL (ref 0.7–1.3)
EGFR: 78 mL/min/{1.73_m2} — ABNORMAL LOW (ref >90)
GLUCOSE: 79 mg/dL (ref 70–140)
POTASSIUM: 4.1 meq/L (ref 3.5–5.1)
SODIUM: 143 meq/L (ref 136–145)
Total Bilirubin: 0.83 mg/dL (ref 0.20–1.20)
Total Protein: 6.9 g/dL (ref 6.4–8.3)

## 2016-10-06 NOTE — Progress Notes (Signed)
Hematology and Oncology Follow Up Visit  [name] [name] [id number] [birth year] 76 y.o. 10/06/2016 12:02 PM   Principle Diagnosis: 76 year old man with castration resistant metastatic prostate cancer with disease to the bone. His initial diagnosis was in 2012 where he presented with metastatic disease to the rib. His PSA was 187.   Prior Therapy:  He was found to have a mass in the right axilla near the fourth rib encasing the right with some sclerosis. He underwent a right VATS procedure and resection of the third, fourth and fifth rib and reconstruction with titanium plates under the care of Dr. Arlyce Dice on November 13 2010.  He went on to receive androgen deprivation therapy under the care of Dr. Gaynelle Arabian. His PSA nadir was to 0.1 back in May of 2013 and subsequently started to rise.  He was treated with Provenge in the fall of 2014.  After his PSA went up to 178.6 and Xtandi was started in February of 2015 and discontinued in 09/2013 due to progression of disease and poor tolerance. Taxotere chemotherapy started on 10/05/2013. He is status post 16 cycles of therapy last one given in April 2016 with a PSA reduction down to 0.44.   Current therapy: Zytiga 1000 mg daily started in June 2017.   Interim History:  [name] [name] presents today for a followup visit with his wife. Since his last visit, he reports no recent complaints. He has fully recovered from hospitalization in April without any residual issues or complications. He has resumed Zytiga without any nausea, fatigue excessive tiredness. He denied any diarrhea or edema. He is participating in physical therapy and feels that he is getting stronger. His appetite is maintained and his quality of life is unchanged. He denied any bone pain or pathological fractures. His performance status, quality of life as well as mental state is not dramatically changed. Overall his  has been more noticeable and his dementia might have worsened.    He does not  report any headaches or blurry vision or double vision. Has not reported any syncope or change in his mental status. He is not reporting any chest pain or shortness of breath.  Does not report any palpitation or leg edema. Does not report any cough, hemoptysis or wheezing. Does not report any frequency urgency or hesitancy. Does not report any skeletal complications. Does not report any lymphadenopathy or petechiae. Remainder of his review of systems unremarkable.  Medications: I have reviewed the patient's current medications.  Current Outpatient Prescriptions  Medication Sig Dispense Refill  . acetaminophen (TYLENOL) 325 MG tablet Take 650 mg by mouth every 6 (six) hours as needed for moderate pain.     Marland Kitchen aspirin EC 81 MG tablet Take 81 mg by mouth every morning.     . Calcium Citrate-Vitamin D (CITRACAL + D PO) Take 2 tablets by mouth every morning.     . cholecalciferol (VITAMIN D) 1000 units tablet Take 1,000 Units by mouth every morning.     . ferrous sulfate 325 (65 FE) MG tablet Take 325 mg by mouth daily with breakfast.    . ibuprofen (ADVIL,MOTRIN) 200 MG tablet Take 200 mg by mouth every 4 (four) hours as needed for moderate pain.    Marland Kitchen lidocaine-prilocaine (EMLA) cream Apply 1 application topically as needed. 30 g 2  . megestrol (MEGACE) 40 MG/ML suspension TAKE 2 TEASPOONFUL (10 ML) BY MOUTH TWICE DAILY (CASH PRICE $23.00 PER ADC) 240 mL 0  . metoprolol succinate (TOPROL-XL) 25 MG 24 hr  tablet Take 1 tablet (25 mg total) by mouth daily. 30 tablet 0  . pantoprazole (PROTONIX) 40 MG tablet TAKE 1 TABLET BY MOUTH 30 MINUTES BEFORE BREAKFAST AND TAKE 1 TABLET BY MOUTH 30 MINUTES BEFORE SUPPER (Patient taking differently: TAKE 40 MG BY MOUTH 30 MINUTES BEFORE BREAKFAST AND TAKE 1 TABLET BY MOUTH 30 MINUTES BEFORE SUPPER) 60 tablet 11  . potassium chloride SA (K-DUR,KLOR-CON) 20 MEQ tablet TAKE 1 TABLET BY MOUTH DAILY 30 tablet 0  . prochlorperazine (COMPAZINE) 10 MG tablet Take 1 tablet (10 mg  total) by mouth every 6 (six) hours as needed for nausea or vomiting. 30 tablet 0  . simvastatin (ZOCOR) 40 MG tablet Take 40 mg by mouth every evening. Reported on 08/14/2015    . traMADol (ULTRAM) 50 MG tablet Take 1 tablet (50 mg total) by mouth every 6 (six) hours as needed. 30 tablet 0  . ZYTIGA 250 MG tablet TAKE 4 TABLETS BY MOUTH DAILY. TAKE ON AN EMPTY STOMACH 1 HOUR BEFORE OR 2 HOURS AFTER A MEAL 120 tablet 0   No current facility-administered medications for this visit.      Allergies:  Allergies  Allergen Reactions  . Oxycodone Nausea And Vomiting  . Penicillins Itching and Rash    Has patient had a PCN reaction causing immediate rash, facial/tongue/throat swelling, SOB or lightheadedness with hypotension: YES Has patient had a PCN reaction causing severe rash involving mucus membranes or skin necrosis: NO Has patient had a PCN reaction that required hospitalization: NO Has patient had a PCN reaction occurring within the last 10 years: NO If all of the above answers are "NO", then may proceed with Cephalosporin use.     Past Medical History, Surgical history, Social history, and Family History were reviewed and updated.   Physical Exam: Blood pressure 126/70, pulse 70, temperature 97.5 F (36.4 C), temperature source Oral, resp. rate 17, height 5\' 8"  (1.727 m), weight 150 lb 3.2 oz (68.1 kg), SpO2 98 %. ECOG:  1 General appearance: Comfortable-appearing gentleman without distress. Head: Normocephalic, without obvious abnormality no oral ulcers or lesions. Neck: no adenopathy or tenderness. Lymph nodes: Cervical, supraclavicular, and axillary nodes normal. Heart:regular rate and rhythm, S1, S2 normal, no murmur, click, rub or gallop  Chest wall examination revealed Port-A-Cath site without erythema or induration. Lung:chest clear, no wheezing, rales, normal symmetric air entry.  Abdomin: soft, non-tender, without masses or organomegaly no shifting dullness or  ascites. EXT: Trace edema noted. Neurological: No deficits noted.   Lab Results: Lab Results  Component Value Date   WBC 8.5 10/06/2016   HGB 13.3 10/06/2016   HCT 40.1 10/06/2016   MCV 96.1 10/06/2016   PLT 278 10/06/2016     Chemistry      Component Value Date/Time   NA 135 (L) 08/21/2016 1450   K 4.1 08/21/2016 1450   CL 97 (L) 08/14/2016 0421   CO2 24 08/21/2016 1450   BUN 21.0 08/21/2016 1450   CREATININE 1.0 08/21/2016 1450      Component Value Date/Time   CALCIUM 9.5 08/21/2016 1450   ALKPHOS 62 08/21/2016 1450   AST 19 08/21/2016 1450   ALT 18 08/21/2016 1450   BILITOT 0.83 08/21/2016 1450     Results for ZACHERIE, HONEYMAN (MRN [id number]) as of 10/06/2016 11:52  Ref. Range 07/06/2016 13:49 08/21/2016 14:50  PSA Latest Ref Range: 0.0 - 4.0 ng/mL 6.0 (H) 8.8 (H)     Impression and Plan:  76 year old gentleman with the following  issues:  1. 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 After progressing on Xtandi, he received systemic chemotherapy in the form of Taxotere between June 2015 till April 2016 for a total of 16 cycles. His PSA dropped down to 0.44 from 176.  His PSA increased up to 6.8 in June 2017 with bony metastasis that have been relatively stable. He started on Zytiga in June 2017.  PSA and bone scan in March 2018 showed stable disease.  His PSA has been slowly rising over the last few months although he is clinically stable. Risks and benefits of continuing this medication were reviewed again and the plan is to continue with Zytiga given his overall frail status and his tolerance to chemotherapy would be questionable at this time. We'll continue to monitor his PSA and anticipate repeating a bone scan in September or October 2018.   2. 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 Pain has not been an issue at this time.  3. Hypertension: His blood pressure have been within normal range on  Zytiga.  4. IV access: Port-A-Cath will be flushed every 6 weeks.  5. Androgen depravation: His testosterone level in 08/21/2016 indicates castration levels.  6. Weight loss: Weight is stable at this time.  7. Followup: Will be in 6 weeks for a follow-up visit.   [facility], MD  6/19/201812:02 PM

## 2016-10-06 NOTE — Telephone Encounter (Signed)
Scheduled appt per 6/19 los - Gave patient AVS and calender per LOS .  

## 2016-10-07 ENCOUNTER — Telehealth: Payer: Self-pay | Admitting: *Deleted

## 2016-10-07 LAB — PSA: PROSTATE SPECIFIC AG, SERUM: 11.4 ng/mL — AB (ref 0.0–4.0)

## 2016-10-07 NOTE — Telephone Encounter (Signed)
As noted below by Dr. Alen Blew, I informed patient's wife of PSA level and told her there is no change for now. She verbalized understanding.

## 2016-10-07 NOTE — Telephone Encounter (Signed)
-----   Message from Wyatt Portela, MD sent at 10/07/2016  8:39 AM EDT ----- Please let his wife know his PSA. Slightly up but no change for now.

## 2016-10-10 ENCOUNTER — Other Ambulatory Visit: Payer: Self-pay | Admitting: Oncology

## 2016-10-28 ENCOUNTER — Other Ambulatory Visit: Payer: Self-pay | Admitting: Oncology

## 2016-10-29 MED FILL — ZYTIGA 250 MG TABLET: 250 | 30 days supply | Qty: 120 | Fill #0

## 2016-11-13 ENCOUNTER — Other Ambulatory Visit: Payer: Self-pay | Admitting: Oncology

## 2016-11-20 ENCOUNTER — Telehealth: Payer: Self-pay | Admitting: Oncology

## 2016-11-20 ENCOUNTER — Ambulatory Visit (HOSPITAL_BASED_OUTPATIENT_CLINIC_OR_DEPARTMENT_OTHER): Payer: Medicare Other | Admitting: Oncology

## 2016-11-20 ENCOUNTER — Ambulatory Visit (HOSPITAL_BASED_OUTPATIENT_CLINIC_OR_DEPARTMENT_OTHER): Payer: Medicare Other

## 2016-11-20 ENCOUNTER — Other Ambulatory Visit (HOSPITAL_BASED_OUTPATIENT_CLINIC_OR_DEPARTMENT_OTHER): Payer: Medicare Other

## 2016-11-20 VITALS — BP 133/92 | HR 85 | Temp 98.1°F | Resp 18 | Ht 68.0 in | Wt 151.0 lb

## 2016-11-20 DIAGNOSIS — Z95828 Presence of other vascular implants and grafts: Secondary | ICD-10-CM

## 2016-11-20 DIAGNOSIS — M898X9 Other specified disorders of bone, unspecified site: Secondary | ICD-10-CM

## 2016-11-20 DIAGNOSIS — M1990 Unspecified osteoarthritis, unspecified site: Secondary | ICD-10-CM | POA: Diagnosis not present

## 2016-11-20 DIAGNOSIS — I10 Essential (primary) hypertension: Secondary | ICD-10-CM

## 2016-11-20 DIAGNOSIS — C61 Malignant neoplasm of prostate: Secondary | ICD-10-CM

## 2016-11-20 DIAGNOSIS — E291 Testicular hypofunction: Secondary | ICD-10-CM | POA: Diagnosis not present

## 2016-11-20 DIAGNOSIS — C7951 Secondary malignant neoplasm of bone: Secondary | ICD-10-CM

## 2016-11-20 LAB — CBC WITH DIFFERENTIAL/PLATELET
BASO%: 0.9 % (ref 0.0–2.0)
Basophils Absolute: 0.1 10*3/uL (ref 0.0–0.1)
EOS%: 3.1 % (ref 0.0–7.0)
Eosinophils Absolute: 0.3 10*3/uL (ref 0.0–0.5)
HCT: 41.1 % (ref 38.4–49.9)
HGB: 13.7 g/dL (ref 13.0–17.1)
LYMPH%: 27.6 % (ref 14.0–49.0)
MCH: 32.1 pg (ref 27.2–33.4)
MCHC: 33.3 g/dL (ref 32.0–36.0)
MCV: 96.2 fL (ref 79.3–98.0)
MONO#: 0.7 10*3/uL (ref 0.1–0.9)
MONO%: 8.4 % (ref 0.0–14.0)
NEUT%: 60 % (ref 39.0–75.0)
NEUTROS ABS: 5.2 10*3/uL (ref 1.5–6.5)
Platelets: 244 10*3/uL (ref 140–400)
RBC: 4.27 10*6/uL (ref 4.20–5.82)
RDW: 13.9 % (ref 11.0–14.6)
WBC: 8.7 10*3/uL (ref 4.0–10.3)
lymph#: 2.4 10*3/uL (ref 0.9–3.3)

## 2016-11-20 LAB — COMPREHENSIVE METABOLIC PANEL WITH GFR
ALT: 11 U/L (ref 0–55)
AST: 17 U/L (ref 5–34)
Albumin: 3.5 g/dL (ref 3.5–5.0)
Alkaline Phosphatase: 59 U/L (ref 40–150)
Anion Gap: 8 meq/L (ref 3–11)
BUN: 16.6 mg/dL (ref 7.0–26.0)
CO2: 24 meq/L (ref 22–29)
Calcium: 9.2 mg/dL (ref 8.4–10.4)
Chloride: 103 meq/L (ref 98–109)
Creatinine: 0.9 mg/dL (ref 0.7–1.3)
EGFR: 85 ml/min/1.73 m2 — ABNORMAL LOW
Glucose: 100 mg/dL (ref 70–140)
Potassium: 4.2 meq/L (ref 3.5–5.1)
Sodium: 135 meq/L — ABNORMAL LOW (ref 136–145)
Total Bilirubin: 0.58 mg/dL (ref 0.20–1.20)
Total Protein: 6.7 g/dL (ref 6.4–8.3)

## 2016-11-20 MED ORDER — HEPARIN SOD (PORK) LOCK FLUSH 100 UNIT/ML IV SOLN
500.0000 [IU] | Freq: Once | INTRAVENOUS | Status: AC | PRN
  Administered 2016-11-20: 500 [IU] via INTRAVENOUS
  Filled 2016-11-20: qty 5

## 2016-11-20 MED ORDER — SODIUM CHLORIDE 0.9 % IJ SOLN
10.0000 mL | INTRAMUSCULAR | Status: DC | PRN
  Administered 2016-11-20: 10 mL via INTRAVENOUS
  Filled 2016-11-20: qty 10

## 2016-11-20 NOTE — Progress Notes (Signed)
Hematology and Oncology Follow Up Visit  [name] [name] [id number] [birth year] 76 y.o. 11/20/2016 3:12 PM   Principle Diagnosis: 76 year old man with castration resistant metastatic prostate cancer with disease to the bone. His initial diagnosis was in 2012 where he presented with metastatic disease to the rib. His PSA was 187.   Prior Therapy:  He was found to have a mass in the right axilla near the fourth rib encasing the right with some sclerosis. He underwent a right VATS procedure and resection of the third, fourth and fifth rib and reconstruction with titanium plates under the care of Dr. Arlyce [name] on November 13 2010.  He went on to receive androgen deprivation therapy under the care of [name] [name]. His PSA nadir was to 0.1 back in May of 2013 and subsequently started to rise.  He was treated with Provenge in the fall of 2014.  After his PSA went up to 178.6 and Xtandi was started in February of 2015 and discontinued in 09/2013 due to progression of disease and poor tolerance. Taxotere chemotherapy started on 10/05/2013. He is status post 16 cycles of therapy last one given in April 2016 with a PSA reduction down to 0.44.   Current therapy: Zytiga 1000 mg daily started in June 2017.   Interim History:  [name] [name] presents today for a followup visit with his wife. Since his last visit, he reports feeling reasonably well without any decline. He denied any bone pain, pathological fractures or recent decline in his performance status. He continues to take Zytiga without any nausea, fatigue excessive tiredness. He denied any diarrhea or edema. He is ambulating with the help of a walker at times but for the most part does not require any assistance. He denied any falls or syncope.   He does not report any headaches or blurry vision or double vision. Has not reported any syncope or change in his mental status. He is not reporting any chest pain or shortness of breath.  Does not report any  palpitation or leg edema. Does not report any cough, hemoptysis or wheezing. Does not report any frequency urgency or hesitancy. Does not report any skeletal complications. Does not report any lymphadenopathy or petechiae. Remainder of his review of systems unremarkable.  Medications: I have reviewed the patient's current medications.  Current Outpatient Prescriptions  Medication Sig Dispense Refill  . acetaminophen (TYLENOL) 325 MG tablet Take 650 mg by mouth every 6 (six) hours as needed for moderate pain.     Marland Kitchen aspirin EC 81 MG tablet Take 81 mg by mouth every morning.     . Calcium Citrate-Vitamin D (CITRACAL + D PO) Take 2 tablets by mouth every morning.     . cholecalciferol (VITAMIN D) 1000 units tablet Take 1,000 Units by mouth every morning.     . ferrous sulfate 325 (65 FE) MG tablet Take 325 mg by mouth daily with breakfast.    . ibuprofen (ADVIL,MOTRIN) 200 MG tablet Take 200 mg by mouth every 4 (four) hours as needed for moderate pain.    Marland Kitchen lidocaine-prilocaine (EMLA) cream Apply 1 application topically as needed. 30 g 2  . megestrol (MEGACE) 40 MG/ML suspension TAKE 2 TEASPOONFUL (10 ML) BY MOUTH TWICE DAILY (CASH PRICE $23.00 PER ADC) 240 mL 0  . metoprolol succinate (TOPROL-XL) 25 MG 24 hr tablet Take 1 tablet (25 mg total) by mouth daily. 30 tablet 0  . pantoprazole (PROTONIX) 40 MG tablet TAKE 1 TABLET BY MOUTH 30 MINUTES BEFORE BREAKFAST AND  TAKE 1 TABLET BY MOUTH 30 MINUTES BEFORE SUPPER (Patient taking differently: TAKE 40 MG BY MOUTH 30 MINUTES BEFORE BREAKFAST AND TAKE 1 TABLET BY MOUTH 30 MINUTES BEFORE SUPPER) 60 tablet 11  . potassium chloride SA (K-DUR,KLOR-CON) 20 MEQ tablet TAKE 1 TABLET BY MOUTH DAILY 30 tablet 0  . prochlorperazine (COMPAZINE) 10 MG tablet Take 1 tablet (10 mg total) by mouth every 6 (six) hours as needed for nausea or vomiting. 30 tablet 0  . simvastatin (ZOCOR) 40 MG tablet Take 40 mg by mouth every evening. Reported on 08/14/2015    . traMADol  (ULTRAM) 50 MG tablet Take 1 tablet (50 mg total) by mouth every 6 (six) hours as needed. 30 tablet 0  . ZYTIGA 250 MG tablet TAKE 4 TABLETS BY MOUTH DAILY. TAKE ON AN EMPTY STOMACH 1 HOUR BEFORE OR 2 HOURS AFTER A MEAL 120 tablet 0   No current facility-administered medications for this visit.      Allergies:  Allergies  Allergen Reactions  . Oxycodone Nausea And Vomiting  . Penicillins Itching and Rash    Has patient had a PCN reaction causing immediate rash, facial/tongue/throat swelling, SOB or lightheadedness with hypotension: YES Has patient had a PCN reaction causing severe rash involving mucus membranes or skin necrosis: NO Has patient had a PCN reaction that required hospitalization: NO Has patient had a PCN reaction occurring within the last 10 years: NO If all of the above answers are "NO", then may proceed with Cephalosporin use.     Past Medical History, Surgical history, Social history, and Family History were reviewed and updated.   Physical Exam: Blood pressure (!) 133/92, pulse 85, temperature 98.1 F (36.7 C), temperature source Oral, resp. rate 18, height 5\' 8"  (1.727 m), weight 151 lb (68.5 kg), SpO2 99 %. ECOG:  1 General appearance: Alert, awake gentleman without distress. Head: Normocephalic, without obvious abnormality no oral thrush or ulcers. Neck: no adenopathy or tenderness. Lymph nodes: Cervical, supraclavicular, and axillary nodes normal. Heart:regular rate and rhythm, S1, S2 normal, no murmur, click, rub or gallop  Chest wall examination revealed Port-A-Cath site without erythema or induration. Lung:chest clear, no wheezing, rales, normal symmetric air entry.  Abdomin: soft, non-tender, without masses or organomegaly no rebound or guarding. EXT: Trace edema noted. Neurological: No deficits noted.   Lab Results: Lab Results  Component Value Date   WBC 8.7 11/20/2016   HGB 13.7 11/20/2016   HCT 41.1 11/20/2016   MCV 96.2 11/20/2016   PLT 244  11/20/2016     Chemistry      Component Value Date/Time   NA 143 10/06/2016 1135   K 4.1 10/06/2016 1135   CL 97 (L) 08/14/2016 0421   CO2 27 10/06/2016 1135   BUN 17.8 10/06/2016 1135   CREATININE 1.0 10/06/2016 1135      Component Value Date/Time   CALCIUM 9.6 10/06/2016 1135   ALKPHOS 61 10/06/2016 1135   AST 18 10/06/2016 1135   ALT 15 10/06/2016 1135   BILITOT 0.83 10/06/2016 1135      Results for DEAMONTE, SAYEGH (MRN [id number]) as of 11/20/2016 14:56  Ref. Range 08/21/2016 14:50 10/06/2016 11:35  Prostate Specific Ag, Serum Latest Ref Range: 0.0 - 4.0 ng/mL 8.8 (H) 11.4 (H)     Impression and Plan:  76 year old gentleman with the following issues:  1. 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 After progressing on Xtandi, he received systemic chemotherapy in the form of Taxotere between  June 2015 till April 2016 for a total of 16 cycles. His PSA dropped down to 0.44 from 176.  His PSA increased up to 6.8 in June 2017 with bony metastasis that have been relatively stable. He started on Zytiga in June 2017.  PSA and bone scan in March 2018 showed stable disease.  His most recent PSA did show increase up to 11.4 in June 2018. The plan is to restage him with a CT scan and a bone scan before the next visit. We have discussed different salvage therapies at this time if needed to. These options would include systemic chemotherapy in the form of [place] for [place]. For the time being, we will keep him on Zytiga and restaging before the next visit.  2. 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 Pain has been minimal at this time.  3. Hypertension: His blood pressure have been within normal range on Zytiga.  4. IV access: Port-A-Cath will be flushed every 6 weeks.  5. Androgen depravation: His testosterone level in 08/21/2016 indicates castration levels. Testosterone will be repeated for the next visit.  6.  Followup: Will be in 6 weeks for a follow-up visit.   Zola Button, MD  8/3/20183:12 PM

## 2016-11-20 NOTE — Telephone Encounter (Signed)
Gave patient avs and calendar for Sept.

## 2016-11-21 LAB — PSA: PROSTATE SPECIFIC AG, SERUM: 12.1 ng/mL — AB (ref 0.0–4.0)

## 2016-11-23 ENCOUNTER — Telehealth: Payer: Self-pay | Admitting: *Deleted

## 2016-11-23 NOTE — Telephone Encounter (Signed)
As noted below by Dr. Alen Blew, I informed patient's wife of his PSA level. She verbalized understanding.

## 2016-11-23 NOTE — Telephone Encounter (Signed)
-----   Message from Wyatt Portela, MD sent at 11/23/2016  9:20 AM EDT ----- Please call him with his PSA. Not changed much.

## 2016-11-24 ENCOUNTER — Other Ambulatory Visit: Payer: Self-pay | Admitting: Oncology

## 2016-11-24 MED FILL — ZYTIGA 250 MG TABLET: 250 | 30 days supply | Qty: 120 | Fill #0

## 2016-11-28 ENCOUNTER — Other Ambulatory Visit: Payer: Self-pay | Admitting: Oncology

## 2016-11-28 DIAGNOSIS — C61 Malignant neoplasm of prostate: Secondary | ICD-10-CM

## 2016-12-16 ENCOUNTER — Other Ambulatory Visit: Payer: Self-pay | Admitting: Oncology

## 2016-12-20 ENCOUNTER — Other Ambulatory Visit: Payer: Self-pay | Admitting: Oncology

## 2016-12-28 MED FILL — ZYTIGA 250 MG TABLET: 250 | 30 days supply | Qty: 120 | Fill #0

## 2016-12-29 ENCOUNTER — Other Ambulatory Visit: Payer: Self-pay | Admitting: Oncology

## 2016-12-29 DIAGNOSIS — C61 Malignant neoplasm of prostate: Secondary | ICD-10-CM

## 2017-01-04 ENCOUNTER — Other Ambulatory Visit (HOSPITAL_BASED_OUTPATIENT_CLINIC_OR_DEPARTMENT_OTHER): Payer: Medicare Other

## 2017-01-04 ENCOUNTER — Ambulatory Visit (HOSPITAL_BASED_OUTPATIENT_CLINIC_OR_DEPARTMENT_OTHER): Payer: Medicare Other

## 2017-01-04 DIAGNOSIS — Z95828 Presence of other vascular implants and grafts: Secondary | ICD-10-CM

## 2017-01-04 DIAGNOSIS — C7951 Secondary malignant neoplasm of bone: Secondary | ICD-10-CM | POA: Diagnosis not present

## 2017-01-04 DIAGNOSIS — C61 Malignant neoplasm of prostate: Secondary | ICD-10-CM

## 2017-01-04 LAB — COMPREHENSIVE METABOLIC PANEL
ALT: 13 U/L (ref 0–55)
AST: 17 U/L (ref 5–34)
Albumin: 3.5 g/dL (ref 3.5–5.0)
Alkaline Phosphatase: 55 U/L (ref 40–150)
Anion Gap: 8 mEq/L (ref 3–11)
BUN: 19.5 mg/dL (ref 7.0–26.0)
CALCIUM: 9.3 mg/dL (ref 8.4–10.4)
CO2: 22 mEq/L (ref 22–29)
Chloride: 104 mEq/L (ref 98–109)
Creatinine: 0.9 mg/dL (ref 0.7–1.3)
EGFR: 81 mL/min/{1.73_m2} — ABNORMAL LOW (ref >90)
Glucose: 95 mg/dl (ref 70–140)
Potassium: 4.1 mEq/L (ref 3.5–5.1)
SODIUM: 135 meq/L — AB (ref 136–145)
Total Bilirubin: 0.79 mg/dL (ref 0.20–1.20)
Total Protein: 6.8 g/dL (ref 6.4–8.3)

## 2017-01-04 LAB — CBC WITH DIFFERENTIAL/PLATELET
BASO%: 0.3 % (ref 0.0–2.0)
BASOS ABS: 0 10*3/uL (ref 0.0–0.1)
EOS%: 2 % (ref 0.0–7.0)
Eosinophils Absolute: 0.2 10*3/uL (ref 0.0–0.5)
HEMATOCRIT: 39.5 % (ref 38.4–49.9)
HGB: 13.5 g/dL (ref 13.0–17.1)
LYMPH#: 2.7 10*3/uL (ref 0.9–3.3)
LYMPH%: 29.7 % (ref 14.0–49.0)
MCH: 32.4 pg (ref 27.2–33.4)
MCHC: 34.2 g/dL (ref 32.0–36.0)
MCV: 94.7 fL (ref 79.3–98.0)
MONO#: 0.8 10*3/uL (ref 0.1–0.9)
MONO%: 9 % (ref 0.0–14.0)
NEUT#: 5.4 10*3/uL (ref 1.5–6.5)
NEUT%: 59 % (ref 39.0–75.0)
Platelets: 209 10*3/uL (ref 140–400)
RBC: 4.17 10*6/uL — AB (ref 4.20–5.82)
RDW: 13.8 % (ref 11.0–14.6)
WBC: 9.2 10*3/uL (ref 4.0–10.3)

## 2017-01-04 MED ORDER — HEPARIN SOD (PORK) LOCK FLUSH 100 UNIT/ML IV SOLN
500.0000 [IU] | Freq: Once | INTRAVENOUS | Status: AC | PRN
  Administered 2017-01-04: 500 [IU] via INTRAVENOUS
  Filled 2017-01-04: qty 5

## 2017-01-04 MED ORDER — SODIUM CHLORIDE 0.9 % IJ SOLN
10.0000 mL | INTRAMUSCULAR | Status: DC | PRN
  Administered 2017-01-04: 10 mL via INTRAVENOUS
  Filled 2017-01-04: qty 10

## 2017-01-05 ENCOUNTER — Encounter (HOSPITAL_COMMUNITY): Payer: Medicare Other

## 2017-01-05 ENCOUNTER — Ambulatory Visit (HOSPITAL_COMMUNITY)
Admission: RE | Admit: 2017-01-05 | Discharge: 2017-01-05 | Disposition: A | Discharge status: Home / Self Care | Payer: Medicare Other | Source: Ambulatory Visit | Attending: Oncology | Admitting: Oncology

## 2017-01-05 ENCOUNTER — Encounter (HOSPITAL_COMMUNITY)
Admission: RE | Admit: 2017-01-05 | Discharge: 2017-01-05 | Disposition: A | Discharge status: Home / Self Care | Payer: Medicare Other | Source: Ambulatory Visit | Attending: Oncology | Admitting: Oncology

## 2017-01-05 DIAGNOSIS — I700 Atherosclerosis of aorta: Secondary | ICD-10-CM | POA: Diagnosis not present

## 2017-01-05 DIAGNOSIS — C61 Malignant neoplasm of prostate: Secondary | ICD-10-CM

## 2017-01-05 DIAGNOSIS — C7951 Secondary malignant neoplasm of bone: Secondary | ICD-10-CM | POA: Diagnosis not present

## 2017-01-05 DIAGNOSIS — M4854XA Collapsed vertebra, not elsewhere classified, thoracic region, initial encounter for fracture: Secondary | ICD-10-CM | POA: Diagnosis not present

## 2017-01-05 DIAGNOSIS — K5730 Diverticulosis of large intestine without perforation or abscess without bleeding: Secondary | ICD-10-CM | POA: Insufficient documentation

## 2017-01-05 LAB — PSA: Prostate Specific Ag, Serum: 13.8 ng/mL — ABNORMAL HIGH (ref 0.0–4.0)

## 2017-01-05 LAB — TESTOSTERONE: Testosterone, Serum: <3 ng/dL — ABNORMAL LOW (ref 264–916)

## 2017-01-05 MED ORDER — TECHNETIUM TC 99M MEDRONATE IV KIT
22.1000 | PACK | Freq: Once | INTRAVENOUS | Status: AC | PRN
  Administered 2017-01-05: 22.1 via INTRAVENOUS

## 2017-01-05 MED ORDER — IOPAMIDOL (ISOVUE-300) INJECTION 61%
INTRAVENOUS | Status: AC
  Filled 2017-01-05: qty 30

## 2017-01-05 MED ORDER — IOPAMIDOL (ISOVUE-300) INJECTION 61%
100.0000 mL | Freq: Once | INTRAVENOUS | Status: AC | PRN
  Administered 2017-01-05: 100 mL via INTRAVENOUS

## 2017-01-05 MED ORDER — IOPAMIDOL (ISOVUE-300) INJECTION 61%
30.0000 mL | Freq: Once | INTRAVENOUS | Status: AC | PRN
  Administered 2017-01-05: 30 mL via ORAL

## 2017-01-05 MED ORDER — IOPAMIDOL (ISOVUE-300) INJECTION 61%
INTRAVENOUS | Status: AC
  Filled 2017-01-05: qty 100

## 2017-01-06 ENCOUNTER — Telehealth: Payer: Self-pay | Admitting: Oncology

## 2017-01-06 ENCOUNTER — Ambulatory Visit (HOSPITAL_BASED_OUTPATIENT_CLINIC_OR_DEPARTMENT_OTHER): Payer: Medicare Other | Admitting: Oncology

## 2017-01-06 VITALS — BP 121/79 | HR 73 | Temp 97.9°F | Resp 18 | Ht 66.0 in | Wt 158.6 lb

## 2017-01-06 DIAGNOSIS — E291 Testicular hypofunction: Secondary | ICD-10-CM | POA: Diagnosis not present

## 2017-01-06 DIAGNOSIS — I10 Essential (primary) hypertension: Secondary | ICD-10-CM

## 2017-01-06 DIAGNOSIS — M898X9 Other specified disorders of bone, unspecified site: Secondary | ICD-10-CM

## 2017-01-06 DIAGNOSIS — C7951 Secondary malignant neoplasm of bone: Secondary | ICD-10-CM

## 2017-01-06 DIAGNOSIS — C61 Malignant neoplasm of prostate: Secondary | ICD-10-CM | POA: Diagnosis not present

## 2017-01-06 NOTE — Telephone Encounter (Signed)
Gave patients wife AVS and calendar of upcoming November appointments.  °

## 2017-01-06 NOTE — Progress Notes (Signed)
Hematology and Oncology Follow Up Visit  [name] [name] [id number] 14-Aug-1940 76 y.o. 01/06/2017 2:57 PM   Principle Diagnosis: 75 year old man with castration resistant metastatic prostate cancer with disease to the bone. His initial diagnosis was in 2012 where he presented with metastatic disease to the rib. His PSA was 187.   Prior Therapy:  He was found to have a mass in the right axilla near the fourth rib encasing the right with some sclerosis. He underwent a right VATS procedure and resection of the third, fourth and fifth rib and reconstruction with titanium plates under the care of Dr. Arlyce Dice on November 13 2010.  He went on to receive androgen deprivation therapy under the care of Dr. Gaynelle Arabian. His PSA nadir was to 0.1 back in May of 2013 and subsequently started to rise.  He was treated with Provenge in the fall of 2014.  After his PSA went up to 178.6 and Xtandi was started in February of 2015 and discontinued in 09/2013 due to progression of disease and poor tolerance. Taxotere chemotherapy started on 10/05/2013. He is status post 16 cycles of therapy last one given in April 2016 with a PSA reduction down to 0.44.   Current therapy: Zytiga 1000 mg daily started in June 2017.   Interim History:  [name] [name] presents today for a followup visit with his wife. Since his last visit, he reports no major changes in his health. His performance status and quality of life is unchanged and is limited in his mobility. He denied any bone pain, pathological fractures or recent decline He continues to take Zytiga without any nausea, fatigue excessive tiredness. He denied any diarrhea or edema. He denied any falls or syncope. His appetite remains excellent and have gained weight.   He does not report any headaches or blurry vision or double vision. Has not reported any syncope or change in his mental status. He is not reporting any chest pain or shortness of breath.  Does not report any palpitation  or leg edema. Does not report any cough, hemoptysis or wheezing. Does not report any frequency urgency or hesitancy. Does not report any skeletal complications. Does not report any lymphadenopathy or petechiae. Remainder of his review of systems unremarkable.  Medications: I have reviewed the patient's current medications.  Current Outpatient Prescriptions  Medication Sig Dispense Refill  . acetaminophen (TYLENOL) 325 MG tablet Take 650 mg by mouth every 6 (six) hours as needed for moderate pain.     Marland Kitchen aspirin EC 81 MG tablet Take 81 mg by mouth every morning.     . Calcium Citrate-Vitamin D (CITRACAL + D PO) Take 2 tablets by mouth every morning.     . cholecalciferol (VITAMIN D) 1000 units tablet Take 1,000 Units by mouth every morning.     . ferrous sulfate 325 (65 FE) MG tablet Take 325 mg by mouth daily with breakfast.    . ibuprofen (ADVIL,MOTRIN) 200 MG tablet Take 200 mg by mouth every 4 (four) hours as needed for moderate pain.    Marland Kitchen lidocaine-prilocaine (EMLA) cream Apply 1 application topically as needed. 30 g 2  . megestrol (MEGACE) 40 MG/ML suspension TAKE TWO TEASPOONFUL (10 ML) BY MOUTH TWICE DAILY 240 mL 0  . metoprolol succinate (TOPROL-XL) 25 MG 24 hr tablet Take 1 tablet (25 mg total) by mouth daily. 30 tablet 0  . pantoprazole (PROTONIX) 40 MG tablet TAKE 1 TABLET BY MOUTH 30 MINUTES BEFORE BREAKFAST AND TAKE 1 TABLET BY MOUTH 30 MINUTES  BEFORE SUPPER (Patient taking differently: TAKE 40 MG BY MOUTH 30 MINUTES BEFORE BREAKFAST AND TAKE 1 TABLET BY MOUTH 30 MINUTES BEFORE SUPPER) 60 tablet 11  . potassium chloride SA (K-DUR,KLOR-CON) 20 MEQ tablet TAKE ONE TABLET BY MOUTH EVERY DAY 30 tablet 0  . prochlorperazine (COMPAZINE) 10 MG tablet Take 1 tablet (10 mg total) by mouth every 6 (six) hours as needed for nausea or vomiting. 30 tablet 0  . simvastatin (ZOCOR) 40 MG tablet Take 40 mg by mouth every evening. Reported on 08/14/2015    . traMADol (ULTRAM) 50 MG tablet Take 1 tablet  (50 mg total) by mouth every 6 (six) hours as needed. 30 tablet 0  . ZYTIGA 250 MG tablet TAKE 4 TABLETS BY MOUTH DAILY. TAKE ON AN EMPTY STOMACH 1 HOUR BEFORE OR 2 HOURS AFTER A MEAL 120 tablet 0   No current facility-administered medications for this visit.      Allergies:  Allergies  Allergen Reactions  . Oxycodone Nausea And Vomiting  . Penicillins Itching and Rash    Has patient had a PCN reaction causing immediate rash, facial/tongue/throat swelling, SOB or lightheadedness with hypotension: YES Has patient had a PCN reaction causing severe rash involving mucus membranes or skin necrosis: NO Has patient had a PCN reaction that required hospitalization: NO Has patient had a PCN reaction occurring within the last 10 years: NO If all of the above answers are "NO", then may proceed with Cephalosporin use.     Past Medical History, Surgical history, Social history, and Family History were reviewed and updated.   Physical Exam: Blood pressure 121/79, pulse 73, temperature 97.9 F (36.6 C), temperature source Oral, resp. rate 18, height 5\' 6"  (1.676 m), weight 158 lb 9.6 oz (71.9 kg), SpO2 99 %. ECOG:  1 General appearance: Well appearing gentleman without distress. Head: Normocephalic, without obvious abnormality no oral ulcers or lesions. Neck: no adenopathy or tenderness. Lymph nodes: Cervical, supraclavicular, and axillary nodes normal. Heart:regular rate and rhythm, S1, S2 normal, no murmur, click, rub or gallop  Chest wall examination revealed Port-A-Cath site without erythema or induration. Lung:chest clear, no wheezing, rales, normal symmetric air entry.  Abdomin: soft, non-tender, without masses or organomegaly no shifting dullness or ascites. EXT: Trace edema noted. Neurological: No deficits noted.   Lab Results: Lab Results  Component Value Date   WBC 9.2 01/04/2017   HGB 13.5 01/04/2017   HCT 39.5 01/04/2017   MCV 94.7 01/04/2017   PLT 209 01/04/2017      Chemistry      Component Value Date/Time   NA 135 (L) 01/04/2017 1359   K 4.1 01/04/2017 1359   CL 97 (L) 08/14/2016 0421   CO2 22 01/04/2017 1359   BUN 19.5 01/04/2017 1359   CREATININE 0.9 01/04/2017 1359      Component Value Date/Time   CALCIUM 9.3 01/04/2017 1359   ALKPHOS 55 01/04/2017 1359   AST 17 01/04/2017 1359   ALT 13 01/04/2017 1359   BILITOT 0.79 01/04/2017 1359     Results for CARLAS, VANDYNE (MRN [id number]) as of 01/06/2017 14:36  Ref. Range 10/06/2016 11:35 11/20/2016 14:39 01/04/2017 13:59  Prostate Specific Ag, Serum Latest Ref Range: 0.0 - 4.0 ng/mL 11.4 (H) 12.1 (H) 13.8 (H)    EXAM: NUCLEAR MEDICINE WHOLE BODY BONE SCAN  TECHNIQUE: Whole body anterior and posterior images were obtained approximately 3 hours after intravenous injection of radiopharmaceutical.  RADIOPHARMACEUTICALS:  22.1 mCi Technetium-83m MDP IV  COMPARISON:  07/08/2016.  FINDINGS: Bilateral renal function and excretion. Lower thoracic can body focal area of increased uptake is unchanged. Mild focal areas of increased activity in the right proximal humerus and in the upper ribs bilaterally unchanged . Focal area of increased uptake in the right ischium is unchanged. No new abnormal areas of activity noted.  IMPRESSION: Stable areas of mild increased activity again noted without significant change from prior exam. Findings are consistent with metastatic disease. No new lesions identified.   IMPRESSION: 1. Scattered osseous metastatic disease. T10 compression fracture is somewhat sclerotic but similar to that shown on 08/12/2016. Prior compression fractures at T12, L2, and L4. Sclerosis in the right ischium and right inferior pubic ramus with a new 1.1 cm focus of cortical destruction and extraosseous extension of tumor along the right lateral inferior pubic ramus. 2.  Aortic Atherosclerosis (ICD10-I70.0).  No adenopathy identified. 3. Sigmoid colon  diverticulosis    Impression and Plan:  76 year old gentleman with the following issues:  1. 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 After progressing on Xtandi, he received systemic chemotherapy in the form of Taxotere between June 2015 till April 2016 for a total of 16 cycles. His PSA dropped down to 0.44 from 176.  His PSA increased up to 6.8 in June 2017 with bony metastasis that have been relatively stable. He started on Zytiga in June 2017.  His PSA on 01/04/2017 was 13.8. CT scan and bone scan obtained around that time were personally reviewed and discussed with the patient today. He appears to have stable disease with his staging workup at this time. Risks and benefits of continuing the current treatment were reviewed today and he is agreeable to continue. Different salvage therapy may be needed in the future if he develops symptomatic progressive disease.  2. 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 No changes since the last visit.  3. Hypertension: His blood pressure have been within normal range on Zytiga.  4. IV access: Port-A-Cath will be flushed every 6 weeks.  5. 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  6. Followup: Will be in 6 weeks for a follow-up visit.   Zola Button, MD  9/19/20182:57 PM

## 2017-01-18 ENCOUNTER — Other Ambulatory Visit: Payer: Self-pay | Admitting: Oncology

## 2017-01-19 ENCOUNTER — Other Ambulatory Visit: Payer: Self-pay | Admitting: Oncology

## 2017-01-20 MED FILL — ZYTIGA 250 MG TABLET: 250 | 30 days supply | Qty: 120 | Fill #0

## 2017-02-10 ENCOUNTER — Other Ambulatory Visit: Payer: Self-pay | Admitting: Oncology

## 2017-02-12 ENCOUNTER — Telehealth: Payer: Self-pay | Admitting: *Deleted

## 2017-02-12 NOTE — Telephone Encounter (Signed)
Unlikely Zytiga to do it now. We will evaluate next visit.

## 2017-02-12 NOTE — Telephone Encounter (Signed)
"  [name] calling for my husband, patient of Dr. Alen Blew who's on zytiga.  Dr. Alen Blew always looks at his legs and asks about swelling on F/U visits.  I researched finding this is a side effect of Zytiga is why I'm calling.  Concerned and need to make Dr. Alen Blew aware, yesterday our family doctor sent him to ER at [facility] in [place] for a huge amount of edema to legs and redness to legs.  He was tested to rule out heart and kidney problems .  Lasix has ben started.  Call me [phone].  Should be concerned about the Zytiga?"  Routing call information to collaborative nurse and provider for review.  Further patient communication through collaborative nurse.

## 2017-02-12 NOTE — Telephone Encounter (Signed)
As noted below by Dr. Alen Blew, I informed the patient's wife that it is unlikely Zytiga caused the swelling. Dr. Alen Blew will evaluate him on next visit, which is 02/25/2017. Patient verbalized understanding.

## 2017-02-25 ENCOUNTER — Ambulatory Visit (HOSPITAL_BASED_OUTPATIENT_CLINIC_OR_DEPARTMENT_OTHER): Payer: Medicare Other

## 2017-02-25 ENCOUNTER — Telehealth: Payer: Self-pay | Admitting: Oncology

## 2017-02-25 ENCOUNTER — Other Ambulatory Visit (HOSPITAL_BASED_OUTPATIENT_CLINIC_OR_DEPARTMENT_OTHER): Payer: Medicare Other

## 2017-02-25 ENCOUNTER — Ambulatory Visit (HOSPITAL_BASED_OUTPATIENT_CLINIC_OR_DEPARTMENT_OTHER): Payer: Medicare Other | Admitting: Oncology

## 2017-02-25 VITALS — BP 135/79 | HR 79 | Temp 97.7°F | Resp 16 | Wt 167.2 lb

## 2017-02-25 DIAGNOSIS — C61 Malignant neoplasm of prostate: Secondary | ICD-10-CM | POA: Diagnosis not present

## 2017-02-25 DIAGNOSIS — E291 Testicular hypofunction: Secondary | ICD-10-CM | POA: Diagnosis not present

## 2017-02-25 DIAGNOSIS — R600 Localized edema: Secondary | ICD-10-CM | POA: Diagnosis not present

## 2017-02-25 DIAGNOSIS — C7951 Secondary malignant neoplasm of bone: Secondary | ICD-10-CM

## 2017-02-25 DIAGNOSIS — I10 Essential (primary) hypertension: Secondary | ICD-10-CM | POA: Diagnosis not present

## 2017-02-25 DIAGNOSIS — Z95828 Presence of other vascular implants and grafts: Secondary | ICD-10-CM

## 2017-02-25 LAB — COMPREHENSIVE METABOLIC PANEL
ALT: 12 U/L (ref 0–55)
AST: 19 U/L (ref 5–34)
Albumin: 3.1 g/dL — ABNORMAL LOW (ref 3.5–5.0)
Alkaline Phosphatase: 83 U/L (ref 40–150)
Anion Gap: 7 mEq/L (ref 3–11)
BILIRUBIN TOTAL: 0.53 mg/dL (ref 0.20–1.20)
BUN: 16.6 mg/dL (ref 7.0–26.0)
CO2: 25 meq/L (ref 22–29)
CREATININE: 0.9 mg/dL (ref 0.7–1.3)
Calcium: 8.9 mg/dL (ref 8.4–10.4)
Chloride: 103 mEq/L (ref 98–109)
EGFR: >60 mL/min/{1.73_m2} (ref >60)
GLUCOSE: 114 mg/dL (ref 70–140)
Potassium: 3.7 mEq/L (ref 3.5–5.1)
SODIUM: 135 meq/L — AB (ref 136–145)
TOTAL PROTEIN: 6.6 g/dL (ref 6.4–8.3)

## 2017-02-25 LAB — CBC WITH DIFFERENTIAL/PLATELET
BASO%: 1.3 % (ref 0.0–2.0)
BASOS ABS: 0.1 10*3/uL (ref 0.0–0.1)
EOS%: 4.9 % (ref 0.0–7.0)
Eosinophils Absolute: 0.4 10*3/uL (ref 0.0–0.5)
HEMATOCRIT: 35.6 % — AB (ref 38.4–49.9)
HGB: 11.9 g/dL — ABNORMAL LOW (ref 13.0–17.1)
LYMPH%: 23.5 % (ref 14.0–49.0)
MCH: 31.4 pg (ref 27.2–33.4)
MCHC: 33.6 g/dL (ref 32.0–36.0)
MCV: 93.6 fL (ref 79.3–98.0)
MONO#: 0.7 10*3/uL (ref 0.1–0.9)
MONO%: 8 % (ref 0.0–14.0)
NEUT#: 5.6 10*3/uL (ref 1.5–6.5)
NEUT%: 62.3 % (ref 39.0–75.0)
Platelets: 357 10*3/uL (ref 140–400)
RBC: 3.8 10*6/uL — ABNORMAL LOW (ref 4.20–5.82)
RDW: 13.8 % (ref 11.0–14.6)
WBC: 8.9 10*3/uL (ref 4.0–10.3)
lymph#: 2.1 10*3/uL (ref 0.9–3.3)

## 2017-02-25 MED ORDER — SODIUM CHLORIDE 0.9 % IJ SOLN
10.0000 mL | INTRAMUSCULAR | Status: DC | PRN
  Administered 2017-02-25: 10 mL via INTRAVENOUS
  Filled 2017-02-25: qty 10

## 2017-02-25 MED ORDER — HEPARIN SOD (PORK) LOCK FLUSH 100 UNIT/ML IV SOLN
500.0000 [IU] | Freq: Once | INTRAVENOUS | Status: AC | PRN
  Administered 2017-02-25: 500 [IU] via INTRAVENOUS
  Filled 2017-02-25: qty 5

## 2017-02-25 NOTE — Telephone Encounter (Signed)
Scheduled appt per 11/8 los - Gave patient AVS and calender per los.  

## 2017-02-25 NOTE — Progress Notes (Signed)
Hematology and Oncology Follow Up Visit  [name] [name] [id number] 1941/02/05 76 y.o. 02/25/2017 3:41 PM   Principle Diagnosis: 76 year old man with castration resistant metastatic prostate cancer with disease to the bone. His initial diagnosis was in 2012 where he presented with metastatic disease to the rib. His PSA was 187.   Prior Therapy:  He was found to have a mass in the right axilla near the fourth rib encasing the right with some sclerosis. He underwent a right VATS procedure and resection of the third, fourth and fifth rib and reconstruction with titanium plates under the care of Dr. Arlyce Dice on November 13 2010.  He went on to receive androgen deprivation therapy under the care of Dr. Gaynelle Arabian. His PSA nadir was to 0.1 back in May of 2013 and subsequently started to rise.  He was treated with Provenge in the fall of 2014.  After his PSA went up to 178.6 and Xtandi was started in February of 2015 and discontinued in 09/2013 due to progression of disease and poor tolerance. Taxotere chemotherapy started on 10/05/2013. He is status post 16 cycles of therapy last one given in April 2016 with a PSA reduction down to 0.44.   Current therapy: Zytiga 1000 mg daily started in June 2017.   Interim History:  [name] [name] presents today for a followup visit with his wife. Since his last visit, he reports increase in his lower extremity edema.  He noted gradual increase in his bilateral leg edema and was seen in the emergency department for an evaluation.  His initial evaluation including chest x-ray and EKG did not show any acute abnormalities.  He was started on Lasix which have helped to decrease the edema slightly.  His mobility has been limited related to that and he is reporting slight increased dyspnea on exertion.  His dementia has also has been exacerbated and is requiring more assistance from his wife.    He continues to take Zytiga without any specific side effects associated with it.  His  appetite remained excellent and his quality of life is unchanged.  He denies any bone pain or pathological fractures.   He does not report any headaches or blurry vision or double vision. Has not reported any syncope or change in his mental status. He is not reporting any chest pain Does not report any palpitation or leg edema. Does not report any cough, hemoptysis or wheezing. Does not report any frequency urgency or hesitancy. Does not report any skeletal complications. Does not report any lymphadenopathy or petechiae. Remainder of his review of systems unremarkable.  Medications: I have reviewed the patient's current medications.  Current Outpatient Medications  Medication Sig Dispense Refill  . acetaminophen (TYLENOL) 325 MG tablet Take 650 mg by mouth every 6 (six) hours as needed for moderate pain.     Marland Kitchen aspirin EC 81 MG tablet Take 81 mg by mouth every morning.     . Calcium Citrate-Vitamin D (CITRACAL + D PO) Take 2 tablets by mouth every morning.     . cholecalciferol (VITAMIN D) 1000 units tablet Take 1,000 Units by mouth every morning.     . ferrous sulfate 325 (65 FE) MG tablet Take 325 mg by mouth daily with breakfast.    . furosemide (LASIX) 20 MG tablet Take 20 mg by mouth.    Marland Kitchen ibuprofen (ADVIL,MOTRIN) 200 MG tablet Take 200 mg by mouth every 4 (four) hours as needed for moderate pain.    Marland Kitchen lidocaine-prilocaine (EMLA) cream Apply  1 application topically as needed. 30 g 2  . megestrol (MEGACE) 40 MG/ML suspension TAKE TWO TEASPOONFUL (10 ML) BY MOUTH TWICE DAILY 240 mL 0  . metoprolol succinate (TOPROL-XL) 25 MG 24 hr tablet Take 1 tablet (25 mg total) by mouth daily. 30 tablet 0  . pantoprazole (PROTONIX) 40 MG tablet TAKE 1 TABLET BY MOUTH 30 MINUTES BEFORE BREAKFAST AND TAKE 1 TABLET BY MOUTH 30 MINUTES BEFORE SUPPER (Patient taking differently: TAKE 40 MG BY MOUTH 30 MINUTES BEFORE BREAKFAST AND TAKE 1 TABLET BY MOUTH 30 MINUTES BEFORE SUPPER) 60 tablet 11  . potassium chloride  SA (K-DUR,KLOR-CON) 20 MEQ tablet TAKE ONE TABLET BY MOUTH EVERY DAY 30 tablet 0  . prochlorperazine (COMPAZINE) 10 MG tablet Take 1 tablet (10 mg total) by mouth every 6 (six) hours as needed for nausea or vomiting. 30 tablet 0  . simvastatin (ZOCOR) 40 MG tablet Take 40 mg by mouth every evening. Reported on 08/14/2015    . traMADol (ULTRAM) 50 MG tablet Take 1 tablet (50 mg total) by mouth every 6 (six) hours as needed. 30 tablet 0  . ZYTIGA 250 MG tablet TAKE 4 TABLETS BY MOUTH DAILY. TAKE ON AN EMPTY STOMACH 1 HOUR BEFORE OR 2 HOURS AFTER A MEAL 120 tablet 0   No current facility-administered medications for this visit.      Allergies:  Allergies  Allergen Reactions  . Oxycodone Nausea And Vomiting  . Penicillins Itching and Rash    Has patient had a PCN reaction causing immediate rash, facial/tongue/throat swelling, SOB or lightheadedness with hypotension: YES Has patient had a PCN reaction causing severe rash involving mucus membranes or skin necrosis: NO Has patient had a PCN reaction that required hospitalization: NO Has patient had a PCN reaction occurring within the last 10 years: NO If all of the above answers are "NO", then may proceed with Cephalosporin use.     Past Medical History, Surgical history, Social history, and Family History were reviewed and updated.   Physical Exam: Blood pressure 135/79, pulse 79, temperature 97.7 F (36.5 C), temperature source Oral, resp. rate 16, weight 167 lb 3 oz (75.8 kg), SpO2 97 %. ECOG:  1 General appearance: Alert, awake gentleman without distress.. Head: Normocephalic, without obvious abnormality no oral rash or ulcers. Neck: no adenopathy or tenderness. Lymph nodes: Cervical, supraclavicular, and axillary nodes normal. Heart:regular rate and rhythm, S1, S2 normal, no murmur, click, rub or gallop  Chest wall examination revealed Port-A-Cath site appeared normal.. Lung:chest clear, no wheezing, rales, normal symmetric air  entry.  No dullness to percussion. Abdomin: soft, non-tender, without masses or organomegaly no rebound or guarding. EXT: 1+ edema noted bilaterally. Neurological: No deficits noted.   Lab Results: Lab Results  Component Value Date   WBC 8.9 02/25/2017   HGB 11.9 (L) 02/25/2017   HCT 35.6 (L) 02/25/2017   MCV 93.6 02/25/2017   PLT 357 02/25/2017     Chemistry      Component Value Date/Time   NA 135 (L) 01/04/2017 1359   K 4.1 01/04/2017 1359   CL 97 (L) 08/14/2016 0421   CO2 22 01/04/2017 1359   BUN 19.5 01/04/2017 1359   CREATININE 0.9 01/04/2017 1359      Component Value Date/Time   CALCIUM 9.3 01/04/2017 1359   ALKPHOS 55 01/04/2017 1359   AST 17 01/04/2017 1359   ALT 13 01/04/2017 1359   BILITOT 0.79 01/04/2017 1359     Results for HEZZIE, KARIM (MRN [id number])  as of 01/06/2017 14:36  Ref. Range 10/06/2016 11:35 11/20/2016 14:39 01/04/2017 13:59  Prostate Specific Ag, Serum Latest Ref Range: 0.0 - 4.0 ng/mL 11.4 (H) 12.1 (H) 13.8 (H)      Impression and Plan:  76 year old gentleman with the following issues:  1. 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   After progressing on Xtandi, he received systemic chemotherapy in the form of Taxotere between June 2015 till April 2016 for a total of 16 cycles. His PSA dropped down to 0.44 from 176.  His PSA increased up to 6.8 in June 2017 with bony metastasis that have been relatively stable. He started on Zytiga in June 2017.  His PSA on 01/04/2017 was 13.8. CT scan and bone scan obtained in September 2018 showed relatively stable disease.  Options of therapy were reviewed today including continuing Zytiga versus switching to different salvage therapy.  Given his overall stable disease and stable clinical status, we elected to continue with the same regimen.  2. 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 No changes since the last visit.  3.  Hypertension: His blood pressure have been within normal range on Zytiga.  4. IV access: Port-A-Cath will be flushed every 6 weeks.  5. 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  6.  Lower extremity edema: Could be related to Zytiga although it is unclear why he is developing it over a year after starting this medication.  I urged him to get a cardiology evaluation and will consider discontinuation of Zytiga if his edema persists.  7. Followup: Will be in 6 weeks for a follow-up visit.   Zola Button, MD  11/8/20183:41 PM

## 2017-02-26 ENCOUNTER — Telehealth: Payer: Self-pay | Admitting: *Deleted

## 2017-02-26 LAB — PSA: Prostate Specific Ag, Serum: 26 ng/mL — ABNORMAL HIGH (ref 0.0–4.0)

## 2017-02-26 MED FILL — ZYTIGA 250 MG TABLET: 250 | 30 days supply | Qty: 120 | Fill #0

## 2017-02-26 NOTE — Telephone Encounter (Signed)
Spoke with patient's wife. Gave results of last PSA 

## 2017-02-26 NOTE — Telephone Encounter (Signed)
-----   Message from Wyatt Portela, MD sent at 02/26/2017  8:51 AM EST ----- Please let his wife know the PSA is up. We will stay with Zytiga for now.

## 2017-02-28 ENCOUNTER — Other Ambulatory Visit: Payer: Self-pay | Admitting: Oncology

## 2017-03-07 ENCOUNTER — Other Ambulatory Visit: Payer: Self-pay | Admitting: Oncology

## 2017-03-07 DIAGNOSIS — C61 Malignant neoplasm of prostate: Secondary | ICD-10-CM

## 2017-03-22 ENCOUNTER — Other Ambulatory Visit: Payer: Self-pay | Admitting: Oncology

## 2017-04-09 ENCOUNTER — Ambulatory Visit (HOSPITAL_BASED_OUTPATIENT_CLINIC_OR_DEPARTMENT_OTHER): Payer: Medicare Other | Admitting: Oncology

## 2017-04-09 ENCOUNTER — Telehealth: Payer: Self-pay | Admitting: Oncology

## 2017-04-09 ENCOUNTER — Other Ambulatory Visit (HOSPITAL_BASED_OUTPATIENT_CLINIC_OR_DEPARTMENT_OTHER): Payer: Medicare Other

## 2017-04-09 ENCOUNTER — Ambulatory Visit (HOSPITAL_BASED_OUTPATIENT_CLINIC_OR_DEPARTMENT_OTHER): Payer: Medicare Other

## 2017-04-09 VITALS — BP 111/79 | HR 101 | Temp 97.7°F | Resp 17 | Ht 66.0 in | Wt 147.4 lb

## 2017-04-09 DIAGNOSIS — C7951 Secondary malignant neoplasm of bone: Secondary | ICD-10-CM

## 2017-04-09 DIAGNOSIS — E291 Testicular hypofunction: Secondary | ICD-10-CM

## 2017-04-09 DIAGNOSIS — M898X9 Other specified disorders of bone, unspecified site: Secondary | ICD-10-CM

## 2017-04-09 DIAGNOSIS — C61 Malignant neoplasm of prostate: Secondary | ICD-10-CM

## 2017-04-09 DIAGNOSIS — R600 Localized edema: Secondary | ICD-10-CM

## 2017-04-09 DIAGNOSIS — I10 Essential (primary) hypertension: Secondary | ICD-10-CM | POA: Diagnosis not present

## 2017-04-09 DIAGNOSIS — Z95828 Presence of other vascular implants and grafts: Secondary | ICD-10-CM

## 2017-04-09 LAB — CBC WITH DIFFERENTIAL/PLATELET
BASO%: 1.1 % (ref 0.0–2.0)
Basophils Absolute: 0.1 10*3/uL (ref 0.0–0.1)
EOS ABS: 0.3 10*3/uL (ref 0.0–0.5)
EOS%: 3 % (ref 0.0–7.0)
HEMATOCRIT: 39.6 % (ref 38.4–49.9)
HEMOGLOBIN: 12.9 g/dL — AB (ref 13.0–17.1)
LYMPH#: 2.4 10*3/uL (ref 0.9–3.3)
LYMPH%: 24 % (ref 14.0–49.0)
MCH: 30.3 pg (ref 27.2–33.4)
MCHC: 32.7 g/dL (ref 32.0–36.0)
MCV: 92.7 fL (ref 79.3–98.0)
MONO#: 0.9 10*3/uL (ref 0.1–0.9)
MONO%: 8.5 % (ref 0.0–14.0)
NEUT%: 63.4 % (ref 39.0–75.0)
NEUTROS ABS: 6.4 10*3/uL (ref 1.5–6.5)
Platelets: 415 10*3/uL — ABNORMAL HIGH (ref 140–400)
RBC: 4.27 10*6/uL (ref 4.20–5.82)
RDW: 13.9 % (ref 11.0–14.6)
WBC: 10.1 10*3/uL (ref 4.0–10.3)

## 2017-04-09 LAB — COMPREHENSIVE METABOLIC PANEL
ALBUMIN: 3.2 g/dL — AB (ref 3.5–5.0)
ALK PHOS: 149 U/L (ref 40–150)
ALT: 8 U/L (ref 0–55)
AST: 19 U/L (ref 5–34)
Anion Gap: 10 mEq/L (ref 3–11)
BILIRUBIN TOTAL: 0.49 mg/dL (ref 0.20–1.20)
BUN: 24.3 mg/dL (ref 7.0–26.0)
CO2: 24 mEq/L (ref 22–29)
CREATININE: 1 mg/dL (ref 0.7–1.3)
Calcium: 9 mg/dL (ref 8.4–10.4)
Chloride: 106 mEq/L (ref 98–109)
EGFR: >60 mL/min/{1.73_m2} (ref >60)
GLUCOSE: 114 mg/dL (ref 70–140)
Potassium: 4.1 mEq/L (ref 3.5–5.1)
SODIUM: 140 meq/L (ref 136–145)
TOTAL PROTEIN: 7 g/dL (ref 6.4–8.3)

## 2017-04-09 MED ORDER — PROCHLORPERAZINE MALEATE 10 MG PO TABS
10.0000 mg | ORAL_TABLET | Freq: Four times a day (QID) | ORAL | 0 refills | Status: DC | PRN
Start: 2017-04-09 — End: 2018-01-17

## 2017-04-09 MED ORDER — HEPARIN SOD (PORK) LOCK FLUSH 100 UNIT/ML IV SOLN
500.0000 [IU] | Freq: Once | INTRAVENOUS | Status: AC | PRN
  Administered 2017-04-09: 500 [IU] via INTRAVENOUS
  Filled 2017-04-09: qty 5

## 2017-04-09 MED ORDER — SODIUM CHLORIDE 0.9 % IJ SOLN
10.0000 mL | INTRAMUSCULAR | Status: DC | PRN
  Administered 2017-04-09: 10 mL via INTRAVENOUS
  Filled 2017-04-09: qty 10

## 2017-04-09 MED ORDER — TRAMADOL HCL 50 MG PO TABS: 50.0000 mg | ORAL_TABLET | Freq: Four times a day (QID) | ORAL | 0 refills | Status: DC | PRN

## 2017-04-09 NOTE — Progress Notes (Signed)
Hematology and Oncology Follow Up Visit  [name] [name] [id number] [birth year] 76 y.o. 04/09/2017 4:05 PM   Principle Diagnosis: 75 year old man with castration resistant metastatic prostate cancer with disease to the bone. His initial diagnosis was in 2012 where he presented with metastatic disease to the rib. His PSA was 187.   Prior Therapy:  He was found to have a mass in the right axilla near the fourth rib encasing the right with some sclerosis. He underwent a right VATS procedure and resection of the third, fourth and fifth rib and reconstruction with titanium plates under the care of Dr. Arlyce Dice on November 13 2010.  He went on to receive androgen deprivation therapy under the care of Dr. Gaynelle Arabian. His PSA nadir was to 0.1 back in May of 2013 and subsequently started to rise.  He was treated with Provenge in the fall of 2014.  After his PSA went up to 178.6 and Xtandi was started in February of 2015 and discontinued in 09/2013 due to progression of disease and poor tolerance. Taxotere chemotherapy started on 10/05/2013. He is status post 16 cycles of therapy last one given in April 2016 with a PSA reduction down to 0.44.   Current therapy: Zytiga 1000 mg daily started in June 2017.  Therapy will be discontinued in December 2018 due to progression of disease.  Interim History:  [name] [name] presents today for a followup visit with his wife. Since his last visit, he reports few complaints and concerns by his wife.  He continues to have intermittent lower extremity edema and underwent evaluation with lower extremity Dopplers.  While the study was being performed he had a vasovagal syncope and required hospitalization at [facility].  He was found to be hypokalemic and was hypotensive he recovered and was discharged after 2 days.  Since his discharge, he has been overall weak and fatigued according to his wife.  He is reporting more bone pain and more mental decline and cognitive  issues.  He continues to take Zytiga although it is unclear how much is contributing to the side effects.  His edema has improved with intermittent elevation.  His appetite has declined and of lost weight and Megace was restarted.   He does not report any headaches or blurry vision or double vision. Has not reported any syncope or change in his mental status. He is not reporting any chest pain Does not report any palpitation or leg edema. Does not report any cough, hemoptysis or wheezing. Does not report any frequency urgency or hesitancy. Does not report any skeletal complications. Does not report any lymphadenopathy or petechiae. Remainder of his review of systems unremarkable.  Medications: I have reviewed the patient's current medications.  Current Outpatient Medications  Medication Sig Dispense Refill  . acetaminophen (TYLENOL) 325 MG tablet Take 650 mg by mouth every 6 (six) hours as needed for moderate pain.     Marland Kitchen aspirin EC 81 MG tablet Take 81 mg by mouth every morning.     . Calcium Citrate-Vitamin D (CITRACAL + D PO) Take 2 tablets by mouth every morning.     . cholecalciferol (VITAMIN D) 1000 units tablet Take 1,000 Units by mouth every morning.     . ferrous sulfate 325 (65 FE) MG tablet Take 325 mg by mouth daily with breakfast.    . furosemide (LASIX) 20 MG tablet Take 20 mg by mouth.    Marland Kitchen ibuprofen (ADVIL,MOTRIN) 200 MG tablet Take 200 mg by mouth every 4 (four) hours  as needed for moderate pain.    Marland Kitchen lidocaine-prilocaine (EMLA) cream Apply 1 application topically as needed. 30 g 2  . megestrol (MEGACE) 40 MG/ML suspension TAKE TWO TEASPOONFUL (10 ML) BY MOUTH TWICE DAILY 240 mL 0  . metoprolol succinate (TOPROL-XL) 25 MG 24 hr tablet Take 1 tablet (25 mg total) by mouth daily. 30 tablet 0  . pantoprazole (PROTONIX) 40 MG tablet TAKE 1 TABLET BY MOUTH 30 MINUTES BEFORE BREAKFAST AND TAKE 1 TABLET BY MOUTH 30 MINUTES BEFORE SUPPER (Patient taking differently: TAKE 40 MG BY MOUTH 30  MINUTES BEFORE BREAKFAST AND TAKE 1 TABLET BY MOUTH 30 MINUTES BEFORE SUPPER) 60 tablet 11  . potassium chloride SA (K-DUR,KLOR-CON) 20 MEQ tablet TAKE ONE TABLET BY MOUTH EVERY DAY 30 tablet 0  . prochlorperazine (COMPAZINE) 10 MG tablet Take 1 tablet (10 mg total) by mouth every 6 (six) hours as needed for nausea or vomiting. 30 tablet 0  . simvastatin (ZOCOR) 40 MG tablet Take 40 mg by mouth every evening. Reported on 08/14/2015    . traMADol (ULTRAM) 50 MG tablet Take 1 tablet (50 mg total) by mouth every 6 (six) hours as needed. 60 tablet 0  . ZYTIGA 250 MG tablet TAKE 4 TABLETS BY MOUTH DAILY. TAKE ON AN EMPTY STOMACH 1 HOUR BEFORE OR 2 HOURS AFTER A MEAL 120 tablet 0   No current facility-administered medications for this visit.      Allergies:  Allergies  Allergen Reactions  . Oxycodone Nausea And Vomiting  . Penicillins Itching and Rash    Has patient had a PCN reaction causing immediate rash, facial/tongue/throat swelling, SOB or lightheadedness with hypotension: YES Has patient had a PCN reaction causing severe rash involving mucus membranes or skin necrosis: NO Has patient had a PCN reaction that required hospitalization: NO Has patient had a PCN reaction occurring within the last 10 years: NO If all of the above answers are "NO", then may proceed with Cephalosporin use.     Past Medical History, Surgical history, Social history, and Family History were reviewed and updated.   Physical Exam: Blood pressure 111/79, pulse (!) 101, temperature 97.7 F (36.5 C), temperature source Oral, resp. rate 17, height 5\' 6"  (1.676 m), weight 147 lb 6.4 oz (66.9 kg), SpO2 97 %. ECOG:  1 General appearance: Alert, awake gentleman without distress.. Head: Normocephalic, without obvious abnormality no oral rash or ulcers. Neck: no adenopathy or tenderness. Lymph nodes: Cervical, supraclavicular, and axillary nodes normal. Heart:regular rate and rhythm, S1, S2 normal, no murmur, click, rub  or gallop  Chest wall examination revealed Port-A-Cath site appeared normal.. Lung:chest clear, no wheezing, rales, normal symmetric air entry.  No dullness to percussion. Abdomin: soft, non-tender, without masses or organomegaly no rebound or guarding. EXT: 1+ edema noted bilaterally. Neurological: No deficits noted.   Lab Results: Lab Results  Component Value Date   WBC 10.1 04/09/2017   HGB 12.9 (L) 04/09/2017   HCT 39.6 04/09/2017   MCV 92.7 04/09/2017   PLT 415 (H) 04/09/2017     Chemistry      Component Value Date/Time   NA 140 04/09/2017 1505   K 4.1 04/09/2017 1505   CL 97 (L) 08/14/2016 0421   CO2 24 04/09/2017 1505   BUN 24.3 04/09/2017 1505   CREATININE 1.0 04/09/2017 1505      Component Value Date/Time   CALCIUM 9.0 04/09/2017 1505   ALKPHOS 149 04/09/2017 1505   AST 19 04/09/2017 1505   ALT 8 04/09/2017  1505   BILITOT 0.49 04/09/2017 1505       Results for IBRAHIMA, HOLBERG (MRN [id number]) as of 04/09/2017 15:27  Ref. Range 01/04/2017 13:59 02/25/2017 14:41  Prostate Specific Ag, Serum Latest Ref Range: 0.0 - 4.0 ng/mL 13.8 (H) 26.0 (H)     Impression and Plan:  76 year old gentleman with the following issues:  1. 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   After progressing on Xtandi, he received systemic chemotherapy in the form of Taxotere between June 2015 till April 2016 for a total of 16 cycles. His PSA dropped down to 0.44 from 176.  His PSA increased up to 6.8 in June 2017 with bony metastasis that have been relatively stable. He started on Zytiga in June 2017.  His PSA on 01/04/2017 was 13.8. CT scan and bone scan obtained in September 2018 showed relatively stable disease.  His PSA in November 2018 was elevated at 26 with overall decline in increase in his pain.  I recommended discontinuation of Zytiga given the fact that he is progressing with potential side effects associated with this  medication.  I have suggested repeating staging workup in February 2019 to evaluate his disease status after treatment break.  If he has rapid progression of his disease, he might not be a candidate for additional chemotherapy.  At that time palliative care and hospice may be his best option.  2. 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  Prescription for tramadol was given to the patient.  3. Hypertension: His blood pressure appears within normal range.  4. IV access: Port-A-Cath will be flushed every 6 weeks.  5. 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  6.  Lower extremity edema: Improved with conservative measures.  7. Followup: Will be in 6 weeks to follow his progress.   Zola Button, MD  12/21/20184:05 PM

## 2017-04-09 NOTE — Patient Instructions (Signed)
Implanted Port Home Guide An implanted port is a type of central line that is placed under the skin. Central lines are used to provide IV access when treatment or nutrition needs to be given through a person's veins. Implanted ports are used for long-term IV access. An implanted port may be placed because:  You need IV medicine that would be irritating to the small veins in your hands or arms.  You need long-term IV medicines, such as antibiotics.  You need IV nutrition for a long period.  You need frequent blood draws for lab tests.  You need dialysis.  Implanted ports are usually placed in the chest area, but they can also be placed in the upper arm, the abdomen, or the leg. An implanted port has two main parts:  Reservoir. The reservoir is round and will appear as a small, raised area under your skin. The reservoir is the part where a needle is inserted to give medicines or draw blood.  Catheter. The catheter is a thin, flexible tube that extends from the reservoir. The catheter is placed into a large vein. Medicine that is inserted into the reservoir goes into the catheter and then into the vein.  How will I care for my incision site? Do not get the incision site wet. Bathe or shower as directed by your health care provider. How is my port accessed? Special steps must be taken to access the port:  Before the port is accessed, a numbing cream can be placed on the skin. This helps numb the skin over the port site.  Your health care provider uses a sterile technique to access the port. ? Your health care provider must put on a mask and sterile gloves. ? The skin over your port is cleaned carefully with an antiseptic and allowed to dry. ? The port is gently pinched between sterile gloves, and a needle is inserted into the port.  Only "non-coring" port needles should be used to access the port. Once the port is accessed, a blood return should be checked. This helps ensure that the port  is in the vein and is not clogged.  If your port needs to remain accessed for a constant infusion, a clear (transparent) bandage will be placed over the needle site. The bandage and needle will need to be changed every week, or as directed by your health care provider.  Keep the bandage covering the needle clean and dry. Do not get it wet. Follow your health care provider's instructions on how to take a shower or bath while the port is accessed.  If your port does not need to stay accessed, no bandage is needed over the port.  What is flushing? Flushing helps keep the port from getting clogged. Follow your health care provider's instructions on how and when to flush the port. Ports are usually flushed with saline solution or a medicine called heparin. The need for flushing will depend on how the port is used.  If the port is used for intermittent medicines or blood draws, the port will need to be flushed: ? After medicines have been given. ? After blood has been drawn. ? As part of routine maintenance.  If a constant infusion is running, the port may not need to be flushed.  How long will my port stay implanted? The port can stay in for as long as your health care provider thinks it is needed. When it is time for the port to come out, surgery will be   done to remove it. The procedure is similar to the one performed when the port was put in. When should I seek immediate medical care? When you have an implanted port, you should seek immediate medical care if:  You notice a bad smell coming from the incision site.  You have swelling, redness, or drainage at the incision site.  You have more swelling or pain at the port site or the surrounding area.  You have a fever that is not controlled with medicine.  This information is not intended to replace advice given to you by your health care provider. Make sure you discuss any questions you have with your health care provider. Document  Released: 04/06/2005 Document Revised: 09/12/2015 Document Reviewed: 12/12/2012 Elsevier Interactive Patient Education  2017 [employer]  

## 2017-04-09 NOTE — Telephone Encounter (Signed)
Scheduled appt per 12/21 los - Gave patient AVS and calender per los - Patient to be contacted by central radiology about scans

## 2017-04-10 LAB — PSA: PROSTATE SPECIFIC AG, SERUM: 47.8 ng/mL — AB (ref 0.0–4.0)

## 2017-04-12 ENCOUNTER — Telehealth: Payer: Self-pay | Admitting: *Deleted

## 2017-04-12 NOTE — Telephone Encounter (Signed)
-----   Message from Wyatt Portela, MD sent at 04/12/2017  9:16 AM EST ----- Please let his wife know his PSA is up. He was told to stop Zytiga and scans are scheduled.

## 2017-04-12 NOTE — Telephone Encounter (Signed)
As noted below by Dr. Alen Blew, I discussed PSA results with wife. Also, instructed her that he needs to stop Zytiga. Dr. Alen Blew has ordered scans. She verbalized understanding.

## 2017-05-07 ENCOUNTER — Encounter (HOSPITAL_COMMUNITY): Payer: Self-pay

## 2017-05-07 ENCOUNTER — Ambulatory Visit (HOSPITAL_COMMUNITY)
Admission: RE | Admit: 2017-05-07 | Discharge: 2017-05-07 | Disposition: A | Discharge status: Home / Self Care | Payer: Medicare Other | Source: Ambulatory Visit | Attending: Oncology | Admitting: Oncology

## 2017-05-07 DIAGNOSIS — I700 Atherosclerosis of aorta: Secondary | ICD-10-CM | POA: Diagnosis not present

## 2017-05-07 DIAGNOSIS — C61 Malignant neoplasm of prostate: Secondary | ICD-10-CM | POA: Insufficient documentation

## 2017-05-07 DIAGNOSIS — C172 Malignant neoplasm of ileum: Secondary | ICD-10-CM | POA: Insufficient documentation

## 2017-05-07 MED ORDER — IOPAMIDOL (ISOVUE-300) INJECTION 61%
100.0000 mL | Freq: Once | INTRAVENOUS | Status: AC | PRN
  Administered 2017-05-07: 100 mL via INTRAVENOUS

## 2017-05-07 MED ORDER — IOPAMIDOL (ISOVUE-300) INJECTION 61%
INTRAVENOUS | Status: AC
  Administered 2017-05-07: 100 mL via INTRAVENOUS
  Filled 2017-05-07: qty 100

## 2017-05-21 ENCOUNTER — Inpatient Hospital Stay: Payer: Medicare Other

## 2017-05-21 ENCOUNTER — Inpatient Hospital Stay: Payer: Medicare Other | Attending: Oncology

## 2017-05-21 DIAGNOSIS — G8929 Other chronic pain: Secondary | ICD-10-CM | POA: Insufficient documentation

## 2017-05-21 DIAGNOSIS — R600 Localized edema: Secondary | ICD-10-CM | POA: Diagnosis not present

## 2017-05-21 DIAGNOSIS — Z7982 Long term (current) use of aspirin: Secondary | ICD-10-CM | POA: Insufficient documentation

## 2017-05-21 DIAGNOSIS — C61 Malignant neoplasm of prostate: Secondary | ICD-10-CM | POA: Diagnosis not present

## 2017-05-21 DIAGNOSIS — Z95828 Presence of other vascular implants and grafts: Secondary | ICD-10-CM

## 2017-05-21 DIAGNOSIS — C7951 Secondary malignant neoplasm of bone: Secondary | ICD-10-CM | POA: Insufficient documentation

## 2017-05-21 LAB — CBC WITH DIFFERENTIAL/PLATELET
BASOS PCT: 1 %
Basophils Absolute: 0.1 10*3/uL (ref 0.0–0.1)
EOS ABS: 0.4 10*3/uL (ref 0.0–0.5)
Eosinophils Relative: 3 %
HCT: 38.5 % (ref 38.4–49.9)
HEMOGLOBIN: 12.6 g/dL — AB (ref 13.0–17.1)
Lymphocytes Relative: 23 %
Lymphs Abs: 2.8 10*3/uL (ref 0.9–3.3)
MCH: 29.6 pg (ref 27.2–33.4)
MCHC: 32.7 g/dL (ref 32.0–36.0)
MCV: 90.6 fL (ref 79.3–98.0)
MONOS PCT: 7 %
Monocytes Absolute: 0.9 10*3/uL (ref 0.1–0.9)
NEUTROS PCT: 66 %
Neutro Abs: 7.8 10*3/uL — ABNORMAL HIGH (ref 1.5–6.5)
Platelets: 328 10*3/uL (ref 140–400)
RBC: 4.25 MIL/uL (ref 4.20–5.82)
RDW: 14.4 % (ref 11.0–14.6)
WBC: 11.9 10*3/uL — AB (ref 4.0–10.3)

## 2017-05-21 LAB — COMPREHENSIVE METABOLIC PANEL
ALK PHOS: 105 U/L (ref 40–150)
ALT: 10 U/L (ref 0–55)
ANION GAP: 8 (ref 3–11)
AST: 16 U/L (ref 5–34)
Albumin: 3.3 g/dL — ABNORMAL LOW (ref 3.5–5.0)
BUN: 19 mg/dL (ref 7–26)
CALCIUM: 9 mg/dL (ref 8.4–10.4)
CO2: 25 mmol/L (ref 22–29)
Chloride: 103 mmol/L (ref 98–109)
Creatinine, Ser: 0.73 mg/dL (ref 0.70–1.30)
GFR calc Af Amer: >60 mL/min (ref >60)
GFR calc non Af Amer: >60 mL/min (ref >60)
Glucose, Bld: 91 mg/dL (ref 70–140)
Potassium: 4.1 mmol/L (ref 3.5–5.1)
Sodium: 136 mmol/L (ref 136–145)
Total Bilirubin: 0.5 mg/dL (ref 0.2–1.2)
Total Protein: 6.6 g/dL (ref 6.4–8.3)

## 2017-05-21 MED ORDER — HEPARIN SOD (PORK) LOCK FLUSH 100 UNIT/ML IV SOLN
500.0000 [IU] | Freq: Once | INTRAVENOUS | Status: AC | PRN
  Administered 2017-05-21: 500 [IU] via INTRAVENOUS
  Filled 2017-05-21: qty 5

## 2017-05-21 MED ORDER — SODIUM CHLORIDE 0.9 % IJ SOLN
10.0000 mL | INTRAMUSCULAR | Status: DC | PRN
  Administered 2017-05-21: 10 mL via INTRAVENOUS
  Filled 2017-05-21: qty 10

## 2017-05-22 LAB — PROSTATE-SPECIFIC AG, SERUM (LABCORP): PROSTATE SPECIFIC AG, SERUM: 49.8 ng/mL — AB (ref 0.0–4.0)

## 2017-05-24 ENCOUNTER — Encounter (HOSPITAL_COMMUNITY)
Admission: RE | Admit: 2017-05-24 | Discharge: 2017-05-24 | Disposition: A | Discharge status: Home / Self Care | Payer: Medicare Other | Source: Ambulatory Visit | Attending: Oncology | Admitting: Oncology

## 2017-05-24 ENCOUNTER — Ambulatory Visit (HOSPITAL_COMMUNITY)
Admission: RE | Admit: 2017-05-24 | Discharge: 2017-05-24 | Disposition: A | Discharge status: Home / Self Care | Payer: Medicare Other | Source: Ambulatory Visit | Attending: Oncology | Admitting: Oncology

## 2017-05-24 DIAGNOSIS — C61 Malignant neoplasm of prostate: Secondary | ICD-10-CM | POA: Insufficient documentation

## 2017-05-24 MED ORDER — TECHNETIUM TC 99M MEDRONATE IV KIT
25.0000 | PACK | Freq: Once | INTRAVENOUS | Status: AC | PRN
  Administered 2017-05-24: 20.5 via INTRAVENOUS

## 2017-05-25 ENCOUNTER — Inpatient Hospital Stay (HOSPITAL_BASED_OUTPATIENT_CLINIC_OR_DEPARTMENT_OTHER): Payer: Medicare Other | Admitting: Oncology

## 2017-05-25 ENCOUNTER — Telehealth: Payer: Self-pay | Admitting: Oncology

## 2017-05-25 VITALS — BP 119/83 | HR 94 | Temp 97.6°F | Resp 19 | Ht 66.0 in | Wt 144.9 lb

## 2017-05-25 DIAGNOSIS — C7951 Secondary malignant neoplasm of bone: Secondary | ICD-10-CM | POA: Diagnosis not present

## 2017-05-25 DIAGNOSIS — C61 Malignant neoplasm of prostate: Secondary | ICD-10-CM | POA: Diagnosis not present

## 2017-05-25 NOTE — Telephone Encounter (Signed)
Scheduled appt per 2/5 los - Gave patient AVS and calender per los.  

## 2017-05-25 NOTE — Progress Notes (Signed)
Hematology and Oncology Follow Up Visit  [name] [name] [id number] [birth year] 77 y.o. 05/25/2017 10:52 AM   Principle Diagnosis: 77 year old man with castration resistant metastatic prostate cancer with disease to the bone diagnosed was in 2012 where he presented with PSA was 187.   Prior Therapy:  He underwent a right VATS procedure and resection of the third, fourth and fifth rib and reconstruction with titanium plates under the care of Dr. Arlyce Dice on November 13 2010.  He S/P androgen deprivation therapy under the care of Dr. Gaynelle Arabian. His PSA nadir was to 0.1 back in May of 2013 and subsequently started to rise.  He was treated with Provenge in the fall of 2014.   [name] was started in February of 2015 and discontinued in 09/2013 due to progression of disease and poor tolerance.  Taxotere chemotherapy started on 10/05/2013. He is status post 16 cycles of therapy last one given in April 2016 with a PSA reduction down to 0.44.  Zytiga 1000 mg daily started in June 2017.  Therapy discontinued in December 2018 due to progression of disease.  Current therapy: Supportive care only.  He is not a candidate for additional chemotherapy.  Interim History:  [name] [name] is here for a follow-up with his wife.  Since the last visit, he reported no major changes in his health or quality of life.  He discontinued Zytiga since the last visit without any new complications noted.  He denies any new bone pain or pathological fractures.  He does have chronic back pain that has not changed related to his osteoarthritis.  He denies any right sided hip discomfort.  His lower extremity edema has resolved at this time.  His appetite has been reasonable and maintain his weight.  His quality of life is very limited and spends the majority of the day and sitting position.   He does not report any headaches or blurry vision or double vision. Has not reported any syncope or change in his mental status. He is not reporting  any chest pain Does not report any palpitation or leg edema. Does not report any cough, hemoptysis or wheezing. Does not report any frequency urgency or hesitancy. Does not report any lymphadenopathy or petechiae.  Does not report any skin rashes or lesions.  Remainder of his review of systems is negative.   Medications: I have reviewed the patient's current medications.  Current Outpatient Medications  Medication Sig Dispense Refill  . acetaminophen (TYLENOL) 325 MG tablet Take 650 mg by mouth every 6 (six) hours as needed for moderate pain.     Marland Kitchen aspirin EC 81 MG tablet Take 81 mg by mouth every morning.     . Calcium Citrate-Vitamin D (CITRACAL + D PO) Take 2 tablets by mouth every morning.     . cholecalciferol (VITAMIN D) 1000 units tablet Take 1,000 Units by mouth every morning.     . ferrous sulfate 325 (65 FE) MG tablet Take 325 mg by mouth daily with breakfast.    . furosemide (LASIX) 20 MG tablet Take 20 mg by mouth.    Marland Kitchen ibuprofen (ADVIL,MOTRIN) 200 MG tablet Take 200 mg by mouth every 4 (four) hours as needed for moderate pain.    Marland Kitchen lidocaine-prilocaine (EMLA) cream Apply 1 application topically as needed. 30 g 2  . megestrol (MEGACE) 40 MG/ML suspension TAKE TWO TEASPOONFUL (10 ML) BY MOUTH TWICE DAILY 240 mL 0  . metoprolol succinate (TOPROL-XL) 25 MG 24 hr tablet Take 1 tablet (25 mg  total) by mouth daily. 30 tablet 0  . pantoprazole (PROTONIX) 40 MG tablet TAKE 1 TABLET BY MOUTH 30 MINUTES BEFORE BREAKFAST AND TAKE 1 TABLET BY MOUTH 30 MINUTES BEFORE SUPPER (Patient taking differently: TAKE 40 MG BY MOUTH 30 MINUTES BEFORE BREAKFAST AND TAKE 1 TABLET BY MOUTH 30 MINUTES BEFORE SUPPER) 60 tablet 11  . potassium chloride SA (K-DUR,KLOR-CON) 20 MEQ tablet TAKE ONE TABLET BY MOUTH EVERY DAY 30 tablet 0  . prochlorperazine (COMPAZINE) 10 MG tablet Take 1 tablet (10 mg total) by mouth every 6 (six) hours as needed for nausea or vomiting. 30 tablet 0  . simvastatin (ZOCOR) 40 MG tablet  Take 40 mg by mouth every evening. Reported on 08/14/2015    . traMADol (ULTRAM) 50 MG tablet Take 1 tablet (50 mg total) by mouth every 6 (six) hours as needed. 60 tablet 0  . ZYTIGA 250 MG tablet TAKE 4 TABLETS BY MOUTH DAILY. TAKE ON AN EMPTY STOMACH 1 HOUR BEFORE OR 2 HOURS AFTER A MEAL 120 tablet 0   No current facility-administered medications for this visit.      Allergies:  Allergies  Allergen Reactions  . Oxycodone Nausea And Vomiting  . Penicillins Itching and Rash    Has patient had a PCN reaction causing immediate rash, facial/tongue/throat swelling, SOB or lightheadedness with hypotension: YES Has patient had a PCN reaction causing severe rash involving mucus membranes or skin necrosis: NO Has patient had a PCN reaction that required hospitalization: NO Has patient had a PCN reaction occurring within the last 10 years: NO If all of the above answers are "NO", then may proceed with Cephalosporin use.     Past Medical History, Surgical history, Social history, and Family History were reviewed and updated.   Physical Exam: Blood pressure 119/83, pulse 94, temperature 97.6 F (36.4 C), temperature source Oral, resp. rate 19, height 5\' 6"  (1.676 m), weight 144 lb 14.4 oz (65.7 kg), SpO2 98 %. ECOG:  1 General appearance: Frail elderly gentleman appeared without distress. Head: Normocephalic, without obvious abnormality  Oral mucosa without thrush or ulcers.   Eyes: No scleral icterus. Lymph nodes: Cervical, supraclavicular, and axillary nodes normal. Heart:regular rate and rhythm, S1, S2 normal, no murmur, click, rub or gallop  Port-A-Cath site appeared without any erythema or induration. Lung:clear to auscultation, no wheezing, rales, normal symmetric air entry.   Abdomin: soft, non-tender, without masses or organomegaly no shifting dullness or ascites. Neurological: No deficits noted.  No motor or sensory deficits. Musculoskeletal: No joint deformity or  effusion.   Lab Results: Lab Results  Component Value Date   WBC 11.9 (H) 05/21/2017   HGB 12.6 (L) 05/21/2017   HCT 38.5 05/21/2017   MCV 90.6 05/21/2017   PLT 328 05/21/2017     Chemistry      Component Value Date/Time   NA 136 05/21/2017 1410   NA 140 04/09/2017 1505   K 4.1 05/21/2017 1410   K 4.1 04/09/2017 1505   CL 103 05/21/2017 1410   CO2 25 05/21/2017 1410   CO2 24 04/09/2017 1505   BUN 19 05/21/2017 1410   BUN 24.3 04/09/2017 1505   CREATININE 0.73 05/21/2017 1410   CREATININE 1.0 04/09/2017 1505      Component Value Date/Time   CALCIUM 9.0 05/21/2017 1410   CALCIUM 9.0 04/09/2017 1505   ALKPHOS 105 05/21/2017 1410   ALKPHOS 149 04/09/2017 1505   AST 16 05/21/2017 1410   AST 19 04/09/2017 1505   ALT 10  05/21/2017 1410   ALT 8 04/09/2017 1505   BILITOT 0.5 05/21/2017 1410   BILITOT 0.49 04/09/2017 1505      Results for HAO, DION (MRN [id number]) as of 05/25/2017 10:57  Ref. Range 04/09/2017 15:04 05/21/2017 14:10  Prostate Specific Ag, Serum Latest Ref Range: 0.0 - 4.0 ng/mL 47.8 (H) 49.8 (H)      Impression and Plan:  77 year old gentleman with the following issues:  1. 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  He is status post multiple therapies outlined above and has progressed on all of them.  His most recent PSA on May 21, 2017 currently at 49.8.  CT scan and bone scan obtained on 05/24/2017 were personally reviewed and discussed with the patient and his wife.  He appears to have stable disease without any major progression.  Options of therapy were reviewed again which include reintroducing systemic chemotherapy versus best supportive care.  He is quite frail and debilitated and his disease appears to be overall stable.  After discussion today we have elected to continue with supportive measures only.  2. Bone pain: Pain reasonably controlled with tramadol.  Spends predominantly arthritic in  nature.  Radiation therapy can be used to palliate any hip pain he develops in the future.  3. IV access: Port-A-Cath will be flushed every 8 weeks.  Risks and benefits of keeping his Port-A-Cath was discussed today he is agreeable to keep it.  4.  Lower extremity edema: Resolved at this time.  5. Followup: Will be in 8 weeks to follow his progress.  15  minutes was spent with the patient face-to-face today.  More than 50% of time was dedicated to patient counseling, education and coordination of his care   Zola Button, MD  2/5/201910:52 AM

## 2017-06-24 ENCOUNTER — Other Ambulatory Visit (INDEPENDENT_AMBULATORY_CARE_PROVIDER_SITE_OTHER): Payer: Self-pay | Admitting: Internal Medicine

## 2017-06-24 DIAGNOSIS — K219 Gastro-esophageal reflux disease without esophagitis: Secondary | ICD-10-CM

## 2017-07-12 ENCOUNTER — Telehealth: Payer: Self-pay | Admitting: Oncology

## 2017-07-12 NOTE — Telephone Encounter (Signed)
CME - moved 4/5 f/u to 4/10. Other appointments remain the same. Left message and mailed schedule.

## 2017-07-21 ENCOUNTER — Inpatient Hospital Stay: Payer: Medicare Other | Attending: Oncology

## 2017-07-21 ENCOUNTER — Inpatient Hospital Stay: Payer: Medicare Other

## 2017-07-21 DIAGNOSIS — Z95828 Presence of other vascular implants and grafts: Secondary | ICD-10-CM

## 2017-07-21 DIAGNOSIS — Z7982 Long term (current) use of aspirin: Secondary | ICD-10-CM | POA: Insufficient documentation

## 2017-07-21 DIAGNOSIS — C7951 Secondary malignant neoplasm of bone: Secondary | ICD-10-CM

## 2017-07-21 DIAGNOSIS — C61 Malignant neoplasm of prostate: Secondary | ICD-10-CM

## 2017-07-21 DIAGNOSIS — Z452 Encounter for adjustment and management of vascular access device: Secondary | ICD-10-CM | POA: Insufficient documentation

## 2017-07-21 LAB — CBC WITH DIFFERENTIAL (CANCER CENTER ONLY)
Basophils Absolute: 0 10*3/uL (ref 0.0–0.1)
Basophils Relative: 0 %
EOS PCT: 4 %
Eosinophils Absolute: 0.4 10*3/uL (ref 0.0–0.5)
HCT: 39.2 % (ref 38.4–49.9)
Hemoglobin: 13.1 g/dL (ref 13.0–17.1)
LYMPHS ABS: 2 10*3/uL (ref 0.9–3.3)
Lymphocytes Relative: 22 %
MCH: 30.9 pg (ref 27.2–33.4)
MCHC: 33.4 g/dL (ref 32.0–36.0)
MCV: 92.5 fL (ref 79.3–98.0)
MONO ABS: 0.9 10*3/uL (ref 0.1–0.9)
MONOS PCT: 10 %
Neutro Abs: 5.7 10*3/uL (ref 1.5–6.5)
Neutrophils Relative %: 64 %
Platelet Count: 315 10*3/uL (ref 140–400)
RBC: 4.24 MIL/uL (ref 4.20–5.82)
RDW: 14.2 % (ref 11.0–14.6)
WBC Count: 9 10*3/uL (ref 4.0–10.3)

## 2017-07-21 LAB — CMP (CANCER CENTER ONLY)
ALT: 15 U/L (ref 0–55)
ANION GAP: 8 (ref 3–11)
AST: 22 U/L (ref 5–34)
Albumin: 3.6 g/dL (ref 3.5–5.0)
Alkaline Phosphatase: 117 U/L (ref 40–150)
BUN: 14 mg/dL (ref 7–26)
CO2: 29 mmol/L (ref 22–29)
CREATININE: 0.82 mg/dL (ref 0.70–1.30)
Calcium: 10.1 mg/dL (ref 8.4–10.4)
Chloride: 100 mmol/L (ref 98–109)
Glucose, Bld: 92 mg/dL (ref 70–140)
Potassium: 4.1 mmol/L (ref 3.5–5.1)
Sodium: 137 mmol/L (ref 136–145)
Total Bilirubin: 0.5 mg/dL (ref 0.2–1.2)
Total Protein: 7.1 g/dL (ref 6.4–8.3)

## 2017-07-21 MED ORDER — SODIUM CHLORIDE 0.9 % IJ SOLN
10.0000 mL | INTRAMUSCULAR | Status: DC | PRN
  Administered 2017-07-21: 10 mL via INTRAVENOUS
  Filled 2017-07-21: qty 10

## 2017-07-21 MED ORDER — HEPARIN SOD (PORK) LOCK FLUSH 100 UNIT/ML IV SOLN
500.0000 [IU] | Freq: Once | INTRAVENOUS | Status: AC | PRN
  Administered 2017-07-21: 500 [IU] via INTRAVENOUS
  Filled 2017-07-21: qty 5

## 2017-07-22 LAB — PROSTATE-SPECIFIC AG, SERUM (LABCORP): PROSTATE SPECIFIC AG, SERUM: 60.2 ng/mL — AB (ref 0.0–4.0)

## 2017-07-23 ENCOUNTER — Ambulatory Visit: Payer: Medicare Other | Admitting: Oncology

## 2017-07-28 ENCOUNTER — Telehealth: Payer: Self-pay | Admitting: Oncology

## 2017-07-28 ENCOUNTER — Inpatient Hospital Stay (HOSPITAL_BASED_OUTPATIENT_CLINIC_OR_DEPARTMENT_OTHER): Payer: Medicare Other | Admitting: Oncology

## 2017-07-28 VITALS — BP 103/65 | HR 75 | Temp 98.0°F | Resp 15 | Wt 143.3 lb

## 2017-07-28 DIAGNOSIS — C61 Malignant neoplasm of prostate: Secondary | ICD-10-CM

## 2017-07-28 DIAGNOSIS — R609 Edema, unspecified: Secondary | ICD-10-CM | POA: Diagnosis not present

## 2017-07-28 DIAGNOSIS — C7951 Secondary malignant neoplasm of bone: Secondary | ICD-10-CM | POA: Diagnosis not present

## 2017-07-28 NOTE — Telephone Encounter (Signed)
Appts scheduled AVS/Calendar printed per 4/10 los °

## 2017-07-28 NOTE — Progress Notes (Signed)
Hematology and Oncology Follow Up Visit  [name] [name] [id number] [birth year] 77 y.o. 07/28/2017 4:05 PM   Principle Diagnosis: 77 year old man with advanced prostate cancer with disease to the bone diagnosed in 2012.  He is castration resistant disease with a active treatment at this time.   Prior Therapy:  He underwent a right VATS procedure and resection of the third, fourth and fifth rib and reconstruction with titanium plates under the care of Dr. Arlyce Dice on November 13 2010.  He S/P androgen deprivation therapy under the care of Dr. Gaynelle Arabian. His PSA nadir was to 0.1 back in May of 2013 and subsequently started to rise.  He was treated with Provenge in the fall of 2014.   [name] was started in February of 2015 and discontinued in 09/2013 due to progression of disease and poor tolerance.  Taxotere chemotherapy started on 10/05/2013. He is status post 16 cycles of therapy last one given in April 2016 with a PSA reduction down to 0.44.  Zytiga 1000 mg daily started in June 2017.  Therapy discontinued in December 2018 due to progression of disease.  Current therapy: Supportive care only.  Interim History:  [name] [name] presents today for a follow-up.  He reports no major changes in his health since the last visit.  Continues to thrive reasonably well of treatment for his prostate cancer.  His appetite has remained about the same and has not had any exacerbation of his bone pain.  His wife does report that he minimizes his symptoms and had periodic pain in his shoulder and hips that is manageable with tramadol.Marland Kitchen  He denies any falls or syncope.  He denied any decline in his quality of life.  He ambulates short distances without any falls or syncope.   He does not report any headaches or blurry vision or double vision. Has not reported any syncope or change in his mental status. He is not reporting any chest pain Does not report any palpitation or leg edema. Does not report any cough, hemoptysis  or wheezing. Does not report any frequency urgency or hesitancy. Does not report any lymphadenopathy or petechiae.  Does not report any skin rashes or lesions.  Remainder of his review of systems is negative.   Medications: I have reviewed the patient's current medications.  Current Outpatient Medications  Medication Sig Dispense Refill  . acetaminophen (TYLENOL) 325 MG tablet Take 650 mg by mouth every 6 (six) hours as needed for moderate pain.     Marland Kitchen aspirin EC 81 MG tablet Take 81 mg by mouth every morning.     . Calcium Citrate-Vitamin D (CITRACAL + D PO) Take 2 tablets by mouth every morning.     . cholecalciferol (VITAMIN D) 1000 units tablet Take 1,000 Units by mouth every morning.     . ferrous sulfate 325 (65 FE) MG tablet Take 325 mg by mouth daily with breakfast.    . furosemide (LASIX) 20 MG tablet Take 20 mg by mouth.    Marland Kitchen ibuprofen (ADVIL,MOTRIN) 200 MG tablet Take 200 mg by mouth every 4 (four) hours as needed for moderate pain.    Marland Kitchen lidocaine-prilocaine (EMLA) cream Apply 1 application topically as needed. 30 g 2  . megestrol (MEGACE) 40 MG/ML suspension TAKE TWO TEASPOONFUL (10 ML) BY MOUTH TWICE DAILY 240 mL 0  . metoprolol succinate (TOPROL-XL) 25 MG 24 hr tablet Take 1 tablet (25 mg total) by mouth daily. 30 tablet 0  . pantoprazole (PROTONIX) 40 MG tablet TAKE 1  TABLET BY MOUTH 30 MINUTES BEFORE BREAKFAST AND TAKE 1 TABLET BY MOUTH 30 MINUTES BEFORE SUPPER 60 tablet 11  . potassium chloride SA (K-DUR,KLOR-CON) 20 MEQ tablet TAKE ONE TABLET BY MOUTH EVERY DAY 30 tablet 0  . prochlorperazine (COMPAZINE) 10 MG tablet Take 1 tablet (10 mg total) by mouth every 6 (six) hours as needed for nausea or vomiting. 30 tablet 0  . simvastatin (ZOCOR) 40 MG tablet Take 40 mg by mouth every evening. Reported on 08/14/2015    . traMADol (ULTRAM) 50 MG tablet Take 1 tablet (50 mg total) by mouth every 6 (six) hours as needed. 60 tablet 0  . ZYTIGA 250 MG tablet TAKE 4 TABLETS BY MOUTH DAILY.  TAKE ON AN EMPTY STOMACH 1 HOUR BEFORE OR 2 HOURS AFTER A MEAL 120 tablet 0   No current facility-administered medications for this visit.      Allergies:  Allergies  Allergen Reactions  . Oxycodone Nausea And Vomiting  . Penicillins Itching and Rash    Has patient had a PCN reaction causing immediate rash, facial/tongue/throat swelling, SOB or lightheadedness with hypotension: YES Has patient had a PCN reaction causing severe rash involving mucus membranes or skin necrosis: NO Has patient had a PCN reaction that required hospitalization: NO Has patient had a PCN reaction occurring within the last 10 years: NO If all of the above answers are "NO", then may proceed with Cephalosporin use.     Past Medical History, Surgical history, Social history, and Family History were reviewed and updated.   Physical Exam: Blood pressure 103/65, pulse 75, temperature 98 F (36.7 C), temperature source Oral, resp. rate 15, weight 143 lb 4.8 oz (65 kg), SpO2 96 %.   ECOG:  1 General appearance: Comfortable appearing gentleman without distress. Head: Atraumatic without abnormalities. Oral mucosa: Mucous membranes are moist and pink..   Eyes: Sclera anicteric. Lymph nodes: No lymphadenopathy in the cervical, axillary or supraclavicular regions. Heart: Regular rate and rhythm without any murmurs or gallops. Lung: Clear with patient without rhonchi, wheezes or dullness to percussion.    Abdomin: Soft, nontender without any rebound or guarding. Neurological: No deficits noted motor or sensory. Musculoskeletal: No clubbing or cyanosis.   Lab Results: Lab Results  Component Value Date   WBC 9.0 07/21/2017   HGB 12.6 (L) 05/21/2017   HCT 39.2 07/21/2017   MCV 92.5 07/21/2017   PLT 315 07/21/2017     Chemistry      Component Value Date/Time   NA 137 07/21/2017 1351   NA 140 04/09/2017 1505   K 4.1 07/21/2017 1351   K 4.1 04/09/2017 1505   CL 100 07/21/2017 1351   CO2 29 07/21/2017 1351    CO2 24 04/09/2017 1505   BUN 14 07/21/2017 1351   BUN 24.3 04/09/2017 1505   CREATININE 0.82 07/21/2017 1351   CREATININE 1.0 04/09/2017 1505      Component Value Date/Time   CALCIUM 10.1 07/21/2017 1351   CALCIUM 9.0 04/09/2017 1505   ALKPHOS 117 07/21/2017 1351   ALKPHOS 149 04/09/2017 1505   AST 22 07/21/2017 1351   AST 19 04/09/2017 1505   ALT 15 07/21/2017 1351   ALT 8 04/09/2017 1505   BILITOT 0.5 07/21/2017 1351   BILITOT 0.49 04/09/2017 1505       Results for DARRIAN, GRZELAK (MRN [id number]) as of 07/28/2017 16:08  Ref. Range 04/09/2017 15:04 05/21/2017 14:10 07/21/2017 13:51  Prostate Specific Ag, Serum Latest Ref Range: 0.0 - 4.0 ng/mL 47.8 (  H) 49.8 (H) 60.2 (H)      Impression and Plan:  77 year old gentleman with the following issues:  1.  Advanced prostate cancer with disease to the bone there is currently castration resistant.   He remains on active surveillance at this time and his PSA has been slowly rising currently at 60.2 on July 21, 2017.  Risks and benefits of continuing this approach was reviewed today with the patient and his wife.  After discussion today, he prefers to continue with active surveillance and he is a marginal candidate for repeat chemotherapy.  He is reasonably thriving of treatment with very little intervention.  We will continue to monitor him periodically and address his symptoms as they arise.  2. Bone pain: Manageable with tramadol at this time.  3. IV access: Port-A-Cath remains in place without complications.  Will be flushed periodically.  4.  Lower extremity edema: Resolved without any recurrence at this time.  5.  Prognosis: He understands he is incurable malignancy and limited life expectancy.  His performance status remains adequate and his quality of life has not changed.  6. Followup: Will be in 8 weeks to follow his progress.  15  minutes was spent with the patient face-to-face today.  More than 50% of time was  dedicated to patient counseling, education and discussing his future plan of care.  Zola Button, MD  4/10/20194:05 PM

## 2017-09-14 ENCOUNTER — Telehealth: Payer: Self-pay | Admitting: Oncology

## 2017-09-14 NOTE — Telephone Encounter (Signed)
Faxed medical records for request for [employer] [phone] on 09/14/17, Release ID [id number]

## 2017-09-16 ENCOUNTER — Other Ambulatory Visit: Payer: Self-pay | Admitting: Oncology

## 2017-09-16 DIAGNOSIS — C61 Malignant neoplasm of prostate: Secondary | ICD-10-CM

## 2017-09-23 ENCOUNTER — Inpatient Hospital Stay: Payer: Medicare Other

## 2017-09-23 ENCOUNTER — Inpatient Hospital Stay: Payer: Medicare Other | Attending: Oncology

## 2017-09-23 DIAGNOSIS — C7951 Secondary malignant neoplasm of bone: Secondary | ICD-10-CM | POA: Insufficient documentation

## 2017-09-23 DIAGNOSIS — C61 Malignant neoplasm of prostate: Secondary | ICD-10-CM | POA: Diagnosis not present

## 2017-09-23 DIAGNOSIS — Z95828 Presence of other vascular implants and grafts: Secondary | ICD-10-CM

## 2017-09-23 DIAGNOSIS — E291 Testicular hypofunction: Secondary | ICD-10-CM | POA: Diagnosis not present

## 2017-09-23 DIAGNOSIS — Z7982 Long term (current) use of aspirin: Secondary | ICD-10-CM | POA: Insufficient documentation

## 2017-09-23 DIAGNOSIS — R600 Localized edema: Secondary | ICD-10-CM | POA: Insufficient documentation

## 2017-09-23 LAB — CBC WITH DIFFERENTIAL (CANCER CENTER ONLY)
BASOS ABS: 0.1 10*3/uL (ref 0.0–0.1)
BASOS PCT: 0 %
EOS ABS: 0.5 10*3/uL (ref 0.0–0.5)
EOS PCT: 5 %
HCT: 39.8 % (ref 38.4–49.9)
Hemoglobin: 13.1 g/dL (ref 13.0–17.1)
Lymphocytes Relative: 22 %
Lymphs Abs: 2.6 10*3/uL (ref 0.9–3.3)
MCH: 30.8 pg (ref 27.2–33.4)
MCHC: 32.9 g/dL (ref 32.0–36.0)
MCV: 93.4 fL (ref 79.3–98.0)
Monocytes Absolute: 1.1 10*3/uL — ABNORMAL HIGH (ref 0.1–0.9)
Monocytes Relative: 9 %
Neutro Abs: 7.6 10*3/uL — ABNORMAL HIGH (ref 1.5–6.5)
Neutrophils Relative %: 64 %
PLATELETS: 324 10*3/uL (ref 140–400)
RBC: 4.26 MIL/uL (ref 4.20–5.82)
RDW: 14 % (ref 11.0–14.6)
WBC: 11.9 10*3/uL — AB (ref 4.0–10.3)

## 2017-09-23 LAB — CMP (CANCER CENTER ONLY)
ALBUMIN: 3.8 g/dL (ref 3.5–5.0)
ALT: 9 U/L (ref 0–55)
AST: 16 U/L (ref 5–34)
Alkaline Phosphatase: 92 U/L (ref 40–150)
Anion gap: 8 (ref 3–11)
BUN: 19 mg/dL (ref 7–26)
CHLORIDE: 101 mmol/L (ref 98–109)
CO2: 26 mmol/L (ref 22–29)
CREATININE: 0.82 mg/dL (ref 0.70–1.30)
Calcium: 9.2 mg/dL (ref 8.4–10.4)
GFR, Estimated: >60 mL/min (ref >60)
GLUCOSE: 89 mg/dL (ref 70–140)
Potassium: 4.4 mmol/L (ref 3.5–5.1)
SODIUM: 135 mmol/L — AB (ref 136–145)
Total Bilirubin: 0.6 mg/dL (ref 0.2–1.2)
Total Protein: 7 g/dL (ref 6.4–8.3)

## 2017-09-23 MED ORDER — SODIUM CHLORIDE 0.9 % IJ SOLN
10.0000 mL | INTRAMUSCULAR | Status: DC | PRN
  Administered 2017-09-23: 10 mL via INTRAVENOUS
  Filled 2017-09-23: qty 10

## 2017-09-23 MED ORDER — HEPARIN SOD (PORK) LOCK FLUSH 100 UNIT/ML IV SOLN
500.0000 [IU] | Freq: Once | INTRAVENOUS | Status: AC | PRN
  Administered 2017-09-23: 500 [IU] via INTRAVENOUS
  Filled 2017-09-23: qty 5

## 2017-09-24 LAB — PROSTATE-SPECIFIC AG, SERUM (LABCORP): Prostate Specific Ag, Serum: 78.2 ng/mL — ABNORMAL HIGH (ref 0.0–4.0)

## 2017-09-30 ENCOUNTER — Inpatient Hospital Stay: Payer: Medicare Other | Admitting: Oncology

## 2017-09-30 ENCOUNTER — Telehealth: Payer: Self-pay | Admitting: Oncology

## 2017-09-30 VITALS — BP 104/75 | HR 88 | Temp 97.5°F | Resp 17 | Ht 66.0 in | Wt 141.4 lb

## 2017-09-30 DIAGNOSIS — Z7982 Long term (current) use of aspirin: Secondary | ICD-10-CM | POA: Diagnosis not present

## 2017-09-30 DIAGNOSIS — R600 Localized edema: Secondary | ICD-10-CM | POA: Diagnosis not present

## 2017-09-30 DIAGNOSIS — E291 Testicular hypofunction: Secondary | ICD-10-CM | POA: Diagnosis not present

## 2017-09-30 DIAGNOSIS — C7951 Secondary malignant neoplasm of bone: Secondary | ICD-10-CM | POA: Diagnosis not present

## 2017-09-30 DIAGNOSIS — C61 Malignant neoplasm of prostate: Secondary | ICD-10-CM | POA: Diagnosis not present

## 2017-09-30 NOTE — Progress Notes (Signed)
Hematology and Oncology Follow Up Visit  [name] [name] [id number] 1940/09/10 77 y.o. 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  His initial diagnosis was in 2012 with advanced disease at the time of presentation.   Prior Therapy:  He underwent a right VATS procedure and resection of the third, fourth and fifth rib and reconstruction with titanium plates under the care of [name] [name] on November 13 2010.  He S/P androgen deprivation therapy under the care of [name] [name]. His PSA nadir was to 0.1 back in May of 2013 and subsequently started to rise.  He was treated with Provenge in the fall of 2014.   [name] [name] was started in February of 2015 and discontinued in 09/2013 due to progression of disease and poor tolerance.  Taxotere chemotherapy started on 10/05/2013. He is status post 16 cycles of therapy last one given in April 2016 with a PSA reduction down to 0.44.  Zytiga 1000 mg daily started in June 2017.  Therapy discontinued in December 2018 due to progression of disease.  Current therapy: Active surveillance and supportive care.  Interim History:  [name] [name] is here for a follow-up visit.  Since the last visit, he reports no major changes in his health or any recent complaints.  He continues to thrive off any treatment for his cancer.  His mobility has improved and he is ambulating without the help of a cane or a walker.  He denies any falls or syncope.  He denies any bone pain or pathological fractures.  His appetite remained reasonable and weight is stable.  His quality of life remains maintained at this time.    He does not report any headaches or blurry vision or double vision. Has not reported any confusion or neuropathy.  He denies any fevers or chills or sweats.  He is not reporting any chest pain Does not report any palpitation or leg edema. Does not report any cough, hemoptysis or  wheezing. Does not report any frequency urgency or hesitancy.  Denies any nausea, vomiting or abdominal pain.  Does not report any lymphadenopathy or petechiae.  Does not report any skin rashes or lesions.  Remainder of his review of systems is negative.   Medications: I have reviewed the patient's current medications.  Current Outpatient Medications  Medication Sig Dispense Refill  . acetaminophen (TYLENOL) 325 MG tablet Take 650 mg by mouth every 6 (six) hours as needed for moderate pain.     Marland Kitchen aspirin EC 81 MG tablet Take 81 mg by mouth every morning.     . Calcium Citrate-Vitamin D (CITRACAL + D PO) Take 2 tablets by mouth every morning.     . cholecalciferol (VITAMIN D) 1000 units tablet Take 1,000 Units by mouth every morning.     . ferrous sulfate 325 (65 FE) MG tablet Take 325 mg by mouth daily with breakfast.    . furosemide (LASIX) 20 MG tablet Take 20 mg by mouth.    Marland Kitchen ibuprofen (ADVIL,MOTRIN) 200 MG tablet Take 200 mg by mouth every 4 (four) hours as needed for moderate pain.    Marland Kitchen lidocaine-prilocaine (EMLA) cream Apply 1 application topically as needed. 30 g 2  . megestrol (MEGACE) 40 MG/ML suspension TAKE TWO TEASPOONFUL (10 ML) BY MOUTH TWICE DAILY 240 mL 0  . metoprolol succinate (TOPROL-XL) 25 MG 24 hr tablet Take 1 tablet (25 mg total) by mouth daily. 30 tablet 0  .  pantoprazole (PROTONIX) 40 MG tablet TAKE 1 TABLET BY MOUTH 30 MINUTES BEFORE BREAKFAST AND TAKE 1 TABLET BY MOUTH 30 MINUTES BEFORE SUPPER 60 tablet 11  . potassium chloride SA (K-DUR,KLOR-CON) 20 MEQ tablet TAKE ONE TABLET BY MOUTH EVERY DAY 30 tablet 0  . prochlorperazine (COMPAZINE) 10 MG tablet Take 1 tablet (10 mg total) by mouth every 6 (six) hours as needed for nausea or vomiting. 30 tablet 0  . simvastatin (ZOCOR) 40 MG tablet Take 40 mg by mouth every evening. Reported on 08/14/2015    . traMADol (ULTRAM) 50 MG tablet Take 1 tablet (50 mg total) by mouth every 6 (six) hours as needed. 60 tablet 0  . ZYTIGA  250 MG tablet TAKE 4 TABLETS BY MOUTH DAILY. TAKE ON AN EMPTY STOMACH 1 HOUR BEFORE OR 2 HOURS AFTER A MEAL 120 tablet 0   No current facility-administered medications for this visit.      Allergies:  Allergies  Allergen Reactions  . Oxycodone Nausea And Vomiting  . Penicillins Itching and Rash    Has patient had a PCN reaction causing immediate rash, facial/tongue/throat swelling, SOB or lightheadedness with hypotension: YES Has patient had a PCN reaction causing severe rash involving mucus membranes or skin necrosis: NO Has patient had a PCN reaction that required hospitalization: NO Has patient had a PCN reaction occurring within the last 10 years: NO If all of the above answers are "NO", then may proceed with Cephalosporin use.     Past Medical History, Surgical history, Social history, and Family History were reviewed and updated.   Physical Exam: Blood pressure 104/75, pulse 88, temperature (!) 97.5 F (36.4 C), temperature source Oral, resp. rate 17, height 5\' 6"  (1.676 m), weight 141 lb 6.4 oz (64.1 kg), SpO2 96 %.   ECOG:  1 General appearance: Comfortable appearing gentleman without distress today. Head: Normocephalic without abnormalities. Oral mucosa: No oral thrush or ulcers. Eyes: Pulls are equal and round reactive to light. Lymph nodes: No cervical, axillary, inguinal or supraclavicular regions. Heart: Regular rate and rhythm without any murmurs or gallops.  Trace leg edema noted. Lung: Clear in all lung fields without any rhonchi, wheezes or dullness to percussion. Abdomin: Soft, doubt any rebound or guarding.  No shifting dullness or ascites. Neurological: No motor or sensory deficits. Musculoskeletal: No joint deformity or effusion.   Lab Results: Lab Results  Component Value Date   WBC 11.9 (H) 09/23/2017   HGB 13.1 09/23/2017   HCT 39.8 09/23/2017   MCV 93.4 09/23/2017   PLT 324 09/23/2017     Chemistry      Component Value Date/Time   NA 135  (L) 09/23/2017 1415   NA 140 04/09/2017 1505   K 4.4 09/23/2017 1415   K 4.1 04/09/2017 1505   CL 101 09/23/2017 1415   CO2 26 09/23/2017 1415   CO2 24 04/09/2017 1505   BUN 19 09/23/2017 1415   BUN 24.3 04/09/2017 1505   CREATININE 0.82 09/23/2017 1415   CREATININE 1.0 04/09/2017 1505      Component Value Date/Time   CALCIUM 9.2 09/23/2017 1415   CALCIUM 9.0 04/09/2017 1505   ALKPHOS 92 09/23/2017 1415   ALKPHOS 149 04/09/2017 1505   AST 16 09/23/2017 1415   AST 19 04/09/2017 1505   ALT 9 09/23/2017 1415   ALT 8 04/09/2017 1505   BILITOT 0.6 09/23/2017 1415   BILITOT 0.49 04/09/2017 1505      Results for TERRIL, CHESTNUT (MRN [id number]) as  of 09/30/2017 14:51  Ref. Range 07/21/2017 13:51 09/23/2017 14:15  Prostate Specific Ag, Serum Latest Ref Range: 0.0 - 4.0 ng/mL 60.2 (H) 78.2 (H)       Impression and Plan:  77 year old man with:   1.  Castration-resistant advanced prostate cancer with disease to the bone documented since 2014.  He developed advanced prostate cancer initially in 2012.  He has progressed on therapies outlined above and has been on active surveillance since December 2018.  Despite the slow rise in his PSA he continues to thrive off treatment including no recent bone pain and improvement in his quality of life and performance status.  Risks and benefits of continuing this approach was reviewed today and is agreeable to continue without any treatment.  Restarting systemic chemotherapy would be his next option if he develops symptomatic disease.  2. Bone pain: Very little to no bone pain noted at this time.  3. IV access: His Port-A-Cath will continue to be flushed every 2 months.  No issues at this time.  4.  Lower extremity edema: Very trace edema noted and uses Lasix and elevation at times.  His edema is much improved off Zytiga.  5.  Prognosis: He has an incurable malignancy but his performance status remains reasonable and he continues to thrive of  therapy.  I recommend aggressive therapy at this time.  6. Followup: Will be in 8 weeks to follow his progress.  15  minutes was spent with the patient face-to-face today.  More than 50% of time was dedicated to patient counseling, education and coordinating his future plan of care.  Zola Button, MD  6/13/20192:57 PM

## 2017-09-30 NOTE — Telephone Encounter (Signed)
Scheduled appt per 6/13 los - Gave patient aVS and calender per los.

## 2017-10-06 ENCOUNTER — Ambulatory Visit (INDEPENDENT_AMBULATORY_CARE_PROVIDER_SITE_OTHER): Payer: Medicare Other | Admitting: Internal Medicine

## 2017-10-06 ENCOUNTER — Encounter (INDEPENDENT_AMBULATORY_CARE_PROVIDER_SITE_OTHER): Payer: Self-pay | Admitting: Internal Medicine

## 2017-10-06 VITALS — BP 100/60 | HR 80 | Temp 97.3°F | Ht 68.0 in | Wt 141.4 lb

## 2017-10-06 DIAGNOSIS — K219 Gastro-esophageal reflux disease without esophagitis: Secondary | ICD-10-CM | POA: Diagnosis not present

## 2017-10-06 NOTE — Patient Instructions (Signed)
Continue the Protonix. OV in 1 year.  

## 2017-10-06 NOTE — Progress Notes (Signed)
Subjective:    Patient ID: [name] [name], male    DOB: 03-01-1941, 77 y.o.   MRN: [id number]  HPI Here today for f/u. Last seen in June of 2018. Hx of chronic GERD and maintained on  Protonix Hx of upper GI bleeding in 2015.   Hx of upper GI bleed in 2015. EGD revealed Barrett's esophagus, ulcerative reflux esophagitis and moderate sliding hiatal hernia. No biopsies. He received 2 units of PRBC while in hospital.  Hx of metastatic prostate cancer. Not on chemo at present.  He is doing okay. Per wife, he belches a lot. He does cough up mucous. GERD controlled with  Protonix once a day. BM x 1 a day. Stools are black from the iron.  CBC  13 day ago was normal.   CBC    Component Value Date/Time   WBC 11.9 (H) 09/23/2017 1415   WBC 11.9 (H) 05/21/2017 1410   RBC 4.26 09/23/2017 1415   HGB 13.1 09/23/2017 1415   HGB 12.9 (L) 04/09/2017 1504   HCT 39.8 09/23/2017 1415   HCT 39.6 04/09/2017 1504   PLT 324 09/23/2017 1415   PLT 415 (H) 04/09/2017 1504   MCV 93.4 09/23/2017 1415   MCV 92.7 04/09/2017 1504   MCH 30.8 09/23/2017 1415   MCHC 32.9 09/23/2017 1415   RDW 14.0 09/23/2017 1415   RDW 13.9 04/09/2017 1504   LYMPHSABS 2.6 09/23/2017 1415   LYMPHSABS 2.4 04/09/2017 1504   MONOABS 1.1 (H) 09/23/2017 1415   MONOABS 0.9 04/09/2017 1504   EOSABS 0.5 09/23/2017 1415   EOSABS 0.3 04/09/2017 1504   BASOSABS 0.1 09/23/2017 1415   BASOSABS 0.1 04/09/2017 1504        03/14/2014 EGD  Impression: 5 cm long tubular Barrett's segment with multiple benign appearing ulcers felt to be source of patient's upper GI bleed. No active bleeding noted. Moderate-sized sliding hiatal hernia.  Review of Systems Past Medical History:  Diagnosis Date  . Anxiety   . 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 Barrett's esophageal ulceration 2015  . Fatigue   . H/O: GI bleed 2015  . Hyperlipidemia   . Hypertension    denies, has had hypotension  . Metastatic bone tumor ([place])  11/13/2010   RT VAT , RESECTION OF 3rd,4th ,5th ribs with reconstruction with allograft and titanium platesDR. BURNEY  . Prostate carcinoma ([place])   . PSVT (paroxysmal supraventricular tachycardia) ([place]) 07/2016  . RBBB (right bundle branch block with left anterior fascicular block)   . Tachycardia    history of    Past Surgical History:  Procedure Laterality Date  . DENTAL SURGERY    . ESOPHAGOGASTRODUODENOSCOPY N/A 03/14/2014   Procedure: ESOPHAGOGASTRODUODENOSCOPY (EGD);  Surgeon: Rogene Houston, MD;  Location: [facility];  Service: Endoscopy;  Laterality: N/A;  . RESECTION BONE TUMOR RIB  [id number]   RT VAT , RESECTION OF 3rd,4th ,5th ribs with reconstruction with allograft and titanium platesDR. BURNEY    Allergies  Allergen Reactions  . Oxycodone Nausea And Vomiting  . Penicillins Itching and Rash    Has patient had a PCN reaction causing immediate rash, facial/tongue/throat swelling, SOB or lightheadedness with hypotension: YES Has patient had a PCN reaction causing severe rash involving mucus membranes or skin necrosis: NO Has patient had a PCN reaction that required hospitalization: NO Has patient had a PCN reaction occurring within the last 10 years: NO If all of the above answers are "NO", then may proceed  with Cephalosporin use.     Current Outpatient Medications on File Prior to Visit  Medication Sig Dispense Refill  . acetaminophen (TYLENOL) 325 MG tablet Take 650 mg by mouth every 6 (six) hours as needed for moderate pain.     Marland Kitchen aspirin EC 81 MG tablet Take 81 mg by mouth every morning.     . Calcium Citrate-Vitamin D (CITRACAL + D PO) Take 2 tablets by mouth every morning.     . cholecalciferol (VITAMIN D) 1000 units tablet Take 1,000 Units by mouth every morning.     . ferrous sulfate 325 (65 FE) MG tablet Take 325 mg by mouth daily with breakfast.    . furosemide (LASIX) 20 MG tablet Take 20 mg by mouth.    Marland Kitchen ibuprofen (ADVIL,MOTRIN) 200 MG tablet Take 200 mg  by mouth every 4 (four) hours as needed for moderate pain.    Marland Kitchen lidocaine-prilocaine (EMLA) cream Apply 1 application topically as needed. 30 g 2  . megestrol (MEGACE) 40 MG/ML suspension TAKE TWO TEASPOONFUL (10 ML) BY MOUTH TWICE DAILY 240 mL 0  . metoprolol succinate (TOPROL-XL) 25 MG 24 hr tablet Take 1 tablet (25 mg total) by mouth daily. 30 tablet 0  . pantoprazole (PROTONIX) 40 MG tablet TAKE 1 TABLET BY MOUTH 30 MINUTES BEFORE BREAKFAST AND TAKE 1 TABLET BY MOUTH 30 MINUTES BEFORE SUPPER 60 tablet 11  . potassium chloride SA (K-DUR,KLOR-CON) 20 MEQ tablet TAKE ONE TABLET BY MOUTH EVERY DAY 30 tablet 0  . prochlorperazine (COMPAZINE) 10 MG tablet Take 1 tablet (10 mg total) by mouth every 6 (six) hours as needed for nausea or vomiting. 30 tablet 0  . simvastatin (ZOCOR) 40 MG tablet Take 40 mg by mouth every evening. Reported on 08/14/2015    . traMADol (ULTRAM) 50 MG tablet Take 1 tablet (50 mg total) by mouth every 6 (six) hours as needed. 60 tablet 0   No current facility-administered medications on file prior to visit.         Objective:   Physical Exam Blood pressure 100/60, pulse 80, temperature (!) 97.3 F (36.3 C), height 5\' 8"  (1.727 m), weight 141 lb 6.4 oz (64.1 kg). Alert and oriented. Skin warm and dry. Oral mucosa is moist.   . Sclera anicteric, conjunctivae is pink. Thyroid not enlarged. No cervical lymphadenopathy. Lungs clear. Heart regular rate and rhythm.  Abdomen is soft. Bowel sounds are positive. No hepatomegaly. No abdominal masses felt. No tenderness.  No edema to lower extremities.            Assessment & Plan:  GERD controlled at this time. He will continue the Protonix. OV in 1 year.

## 2017-11-08 ENCOUNTER — Other Ambulatory Visit: Payer: Self-pay | Admitting: Oncology

## 2017-11-08 DIAGNOSIS — C61 Malignant neoplasm of prostate: Secondary | ICD-10-CM

## 2017-11-30 ENCOUNTER — Inpatient Hospital Stay: Payer: Medicare Other | Attending: Oncology

## 2017-11-30 ENCOUNTER — Ambulatory Visit: Payer: Medicare Other | Admitting: Oncology

## 2017-11-30 ENCOUNTER — Inpatient Hospital Stay: Payer: Medicare Other

## 2017-11-30 DIAGNOSIS — C7951 Secondary malignant neoplasm of bone: Secondary | ICD-10-CM | POA: Diagnosis not present

## 2017-11-30 DIAGNOSIS — C61 Malignant neoplasm of prostate: Secondary | ICD-10-CM

## 2017-11-30 DIAGNOSIS — Z79899 Other long term (current) drug therapy: Secondary | ICD-10-CM | POA: Insufficient documentation

## 2017-11-30 DIAGNOSIS — R600 Localized edema: Secondary | ICD-10-CM | POA: Insufficient documentation

## 2017-11-30 DIAGNOSIS — Z95828 Presence of other vascular implants and grafts: Secondary | ICD-10-CM

## 2017-11-30 LAB — CBC WITH DIFFERENTIAL (CANCER CENTER ONLY)
BASOS ABS: 0.1 10*3/uL (ref 0.0–0.1)
BASOS PCT: 1 %
EOS ABS: 0.4 10*3/uL (ref 0.0–0.5)
Eosinophils Relative: 5 %
HCT: 40.8 % (ref 38.4–49.9)
Hemoglobin: 13.5 g/dL (ref 13.0–17.1)
LYMPHS PCT: 29 %
Lymphs Abs: 2.5 10*3/uL (ref 0.9–3.3)
MCH: 31.3 pg (ref 27.2–33.4)
MCHC: 33.1 g/dL (ref 32.0–36.0)
MCV: 94.4 fL (ref 79.3–98.0)
MONO ABS: 0.5 10*3/uL (ref 0.1–0.9)
Monocytes Relative: 6 %
Neutro Abs: 5.1 10*3/uL (ref 1.5–6.5)
Neutrophils Relative %: 59 %
Platelet Count: 308 10*3/uL (ref 140–400)
RBC: 4.32 MIL/uL (ref 4.20–5.82)
RDW: 14.1 % (ref 11.0–14.6)
WBC: 8.7 10*3/uL (ref 4.0–10.3)

## 2017-11-30 LAB — CMP (CANCER CENTER ONLY)
ALK PHOS: 83 U/L (ref 38–126)
ALT: 16 U/L (ref 0–44)
AST: 18 U/L (ref 15–41)
Albumin: 3.3 g/dL — ABNORMAL LOW (ref 3.5–5.0)
Anion gap: 11 (ref 5–15)
BILIRUBIN TOTAL: 0.5 mg/dL (ref 0.3–1.2)
BUN: 19 mg/dL (ref 8–23)
CALCIUM: 8.9 mg/dL (ref 8.9–10.3)
CO2: 24 mmol/L (ref 22–32)
CREATININE: 0.85 mg/dL (ref 0.61–1.24)
Chloride: 104 mmol/L (ref 98–111)
GFR, Est AFR Am: >60 mL/min (ref >60)
Glucose, Bld: 129 mg/dL — ABNORMAL HIGH (ref 70–99)
POTASSIUM: 4 mmol/L (ref 3.5–5.1)
Sodium: 139 mmol/L (ref 135–145)
TOTAL PROTEIN: 7.2 g/dL (ref 6.5–8.1)

## 2017-11-30 MED ORDER — HEPARIN SOD (PORK) LOCK FLUSH 100 UNIT/ML IV SOLN
500.0000 [IU] | Freq: Once | INTRAVENOUS | Status: AC | PRN
  Administered 2017-11-30: 500 [IU] via INTRAVENOUS
  Filled 2017-11-30: qty 5

## 2017-11-30 MED ORDER — SODIUM CHLORIDE 0.9 % IJ SOLN
10.0000 mL | INTRAMUSCULAR | Status: DC | PRN
  Filled 2017-11-30: qty 10

## 2017-12-01 LAB — PROSTATE-SPECIFIC AG, SERUM (LABCORP): Prostate Specific Ag, Serum: 145 ng/mL — ABNORMAL HIGH (ref 0.0–4.0)

## 2017-12-07 ENCOUNTER — Telehealth: Payer: Self-pay | Admitting: Oncology

## 2017-12-07 ENCOUNTER — Inpatient Hospital Stay (HOSPITAL_BASED_OUTPATIENT_CLINIC_OR_DEPARTMENT_OTHER): Payer: Medicare Other | Admitting: Oncology

## 2017-12-07 VITALS — BP 111/78 | HR 101 | Temp 97.6°F | Resp 18 | Ht 68.0 in | Wt 145.7 lb

## 2017-12-07 DIAGNOSIS — C7951 Secondary malignant neoplasm of bone: Secondary | ICD-10-CM | POA: Diagnosis not present

## 2017-12-07 DIAGNOSIS — C61 Malignant neoplasm of prostate: Secondary | ICD-10-CM | POA: Diagnosis not present

## 2017-12-07 DIAGNOSIS — R600 Localized edema: Secondary | ICD-10-CM | POA: Diagnosis not present

## 2017-12-07 NOTE — Progress Notes (Signed)
Hematology and Oncology Follow Up Visit  [name] [name] [id number] 10-16-1940 77 y.o. 12/07/2017 2:56 PM   Principle Diagnosis: 77 year old man with castration-resistant prostate cancer diagnosed in 2014 after initial diagnosis of advanced prostate cancer in 2012.  .   Prior Therapy:  He underwent a right VATS procedure and resection of the third, fourth and fifth rib and reconstruction with titanium plates under the care of Dr. Arlyce Dice on November 13 2010.  He S/P androgen deprivation therapy under the care of Dr. Gaynelle Arabian. His PSA nadir was to 0.1 back in May of 2013 and subsequently started to rise.  He was treated with Provenge in the fall of 2014.   [name] was started in February of 2015 and discontinued in 09/2013 due to progression of disease and poor tolerance.  Taxotere chemotherapy started on 10/05/2013. He is status post 16 cycles of therapy last one given in April 2016 with a PSA reduction down to 0.44.  Zytiga 1000 mg daily started in June 2017.  Therapy discontinued in December 2018 due to progression of disease.  Current therapy: Active surveillance and supportive care.  Interim History:  [name] presents today for a follow-up visit.  Since her last visit, he reports no major changes in his health.  He continues to enjoy reasonable quality of life without any decline in his performance status.  His mobility is limited because of scoliosis but for the most part he is unchanged.  He denies any excessive fatigue or tiredness.  He denies any worsening back pain or bone pain.  His appetite remains reasonable and his weight slightly down.  He denies any recent hospitalizations or illnesses.    He does not report any headaches or blurry vision or double vision. Has not reported any alteration mental status or dizziness.  He denies any fevers or chills or sweats.  He is not reporting any chest pain, palpitation or leg edema. Does not report any cough, hemoptysis or wheezing. Does not  report any frequency urgency or hesitancy.  Denies any nausea, vomiting or abdominal pain.  He denies any changes in his bowel habits.  He denies any frequency urgency or hesitancy.  Does not report any lymphadenopathy or petechiae.  Does not report any skin rashes or lesions.  He denies any bleeding clotting tendencies.  Remainder of his review of systems is negative.   Medications: I have reviewed the patient's current medications.  Current Outpatient Medications  Medication Sig Dispense Refill  . acetaminophen (TYLENOL) 325 MG tablet Take 650 mg by mouth every 6 (six) hours as needed for moderate pain.     Marland Kitchen aspirin EC 81 MG tablet Take 81 mg by mouth every morning.     . Calcium Citrate-Vitamin D (CITRACAL + D PO) Take 2 tablets by mouth every morning.     . cholecalciferol (VITAMIN D) 1000 units tablet Take 1,000 Units by mouth every morning.     . ferrous sulfate 325 (65 FE) MG tablet Take 325 mg by mouth daily with breakfast.    . furosemide (LASIX) 20 MG tablet Take 20 mg by mouth.    Marland Kitchen ibuprofen (ADVIL,MOTRIN) 200 MG tablet Take 200 mg by mouth every 4 (four) hours as needed for moderate pain.    Marland Kitchen lidocaine-prilocaine (EMLA) cream Apply 1 application topically as needed. 30 g 2  . megestrol (MEGACE) 40 MG/ML suspension TAKE TWO TEASPOONFUL (10 ML) BY MOUTH TWICE DAILY 240 mL 0  . metoprolol succinate (TOPROL-XL) 25 MG 24 hr tablet  Take 1 tablet (25 mg total) by mouth daily. 30 tablet 0  . pantoprazole (PROTONIX) 40 MG tablet TAKE 1 TABLET BY MOUTH 30 MINUTES BEFORE BREAKFAST AND TAKE 1 TABLET BY MOUTH 30 MINUTES BEFORE SUPPER (Patient taking differently: daily) 60 tablet 11  . potassium chloride SA (K-DUR,KLOR-CON) 20 MEQ tablet TAKE ONE TABLET BY MOUTH EVERY DAY 30 tablet 0  . prochlorperazine (COMPAZINE) 10 MG tablet Take 1 tablet (10 mg total) by mouth every 6 (six) hours as needed for nausea or vomiting. 30 tablet 0  . simvastatin (ZOCOR) 40 MG tablet Take 40 mg by mouth every  evening. Reported on 08/14/2015    . traMADol (ULTRAM) 50 MG tablet Take 1 tablet (50 mg total) by mouth every 6 (six) hours as needed. 60 tablet 0   No current facility-administered medications for this visit.      Allergies:  Allergies  Allergen Reactions  . Oxycodone Nausea And Vomiting  . Penicillins Itching and Rash    Has patient had a PCN reaction causing immediate rash, facial/tongue/throat swelling, SOB or lightheadedness with hypotension: YES Has patient had a PCN reaction causing severe rash involving mucus membranes or skin necrosis: NO Has patient had a PCN reaction that required hospitalization: NO Has patient had a PCN reaction occurring within the last 10 years: NO If all of the above answers are "NO", then may proceed with Cephalosporin use.     Past Medical History, Surgical history, Social history, and Family History were reviewed and updated.   Physical Exam: Blood pressure 111/78, pulse (!) 101, temperature 97.6 F (36.4 C), temperature source Oral, resp. rate 18, height 5\' 8"  (1.727 m), weight 145 lb 11.2 oz (66.1 kg), SpO2 97 %.    ECOG:  1 General appearance: Alert, awake gentleman without distress. Head: Atraumatic without normalities. Oropharynx: Without any thrush or ulcers. Eyes: Pupils are equal and round reactive to light. Lymph nodes: No lymphadenopathy noted in the cervical, axillary, inguinal or supraclavicular areas. Heart: Regular rate and rhythm.  S1 and S2 without leg edema. Lung: Clear without any rhonchi, wheezes dullness to percussion. Abdomin: Soft, without any rebound or guarding.  No shifting dullness or ascites. Neurological: Intact without any motor or sensory deficits. Musculoskeletal: No clubbing or cyanosis.   Lab Results: Lab Results  Component Value Date   WBC 8.7 11/30/2017   HGB 13.5 11/30/2017   HCT 40.8 11/30/2017   MCV 94.4 11/30/2017   PLT 308 11/30/2017     Chemistry      Component Value Date/Time   NA 139  11/30/2017 1510   NA 140 04/09/2017 1505   K 4.0 11/30/2017 1510   K 4.1 04/09/2017 1505   CL 104 11/30/2017 1510   CO2 24 11/30/2017 1510   CO2 24 04/09/2017 1505   BUN 19 11/30/2017 1510   BUN 24.3 04/09/2017 1505   CREATININE 0.85 11/30/2017 1510   CREATININE 1.0 04/09/2017 1505      Component Value Date/Time   CALCIUM 8.9 11/30/2017 1510   CALCIUM 9.0 04/09/2017 1505   ALKPHOS 83 11/30/2017 1510   ALKPHOS 149 04/09/2017 1505   AST 18 11/30/2017 1510   AST 19 04/09/2017 1505   ALT 16 11/30/2017 1510   ALT 8 04/09/2017 1505   BILITOT 0.5 11/30/2017 1510   BILITOT 0.49 04/09/2017 1505       Results for DELAWRENCE, FRIDMAN (MRN [id number]) as of 12/07/2017 13:54  Ref. Range 09/23/2017 14:15 11/30/2017 15:10  Prostate Specific Ag, Serum Latest  Ref Range: 0.0 - 4.0 ng/mL 78.2 (H) 145.0 (H)       Impression and Plan:  77 year old man with:   1.  Castration-resistant prostate cancer with disease to the bone.  He is status post therapies outlined above with PSA now rapidly rising with a doubling time of less than 2 months.  Options of therapy were reviewed today which include continued active surveillance versus restarting systemic therapy in the form of Jevtana.  The rationale for both these approaches were reviewed today in detail and for the time being he deferred treatment option.  Overall he is rather frail gentleman with marginal tolerance for chemotherapy and enjoying a reasonable quality of life that is asymptomatic from his cancer.  After discussion today, we have elected to repeat his staging work-up in 2 months and follow his status closely.  He will use chemotherapy to palliate any symptoms of his cancer that arise.  2. Bone pain: Very little noted at this time.  His pain is related to scoliosis rather than paresthetic cancer.  3. IV access: His Port-A-Cath remain in place and has been used in the past for chemotherapy.  4.  Lower extremity edema: Resolved at this  time after discontinuation of Zytiga.  5.  Prognosis: He has incurable malignancy with a limited performance status.  6. Followup: Will be in 2 months to follow his progress.  15  minutes was spent with the patient face-to-face today.  More than 50% of time was dedicated to discussing the natural course of this disease, treatment options and coordinating his future plan of care.  Zola Button, MD  8/20/20192:56 PM

## 2017-12-07 NOTE — Telephone Encounter (Signed)
Appts scheduled AVS/Calendar printed per 8/20 los °

## 2017-12-13 ENCOUNTER — Telehealth: Payer: Self-pay | Admitting: *Deleted

## 2017-12-13 ENCOUNTER — Other Ambulatory Visit: Payer: Self-pay | Admitting: *Deleted

## 2017-12-13 MED ORDER — HYDROCODONE-ACETAMINOPHEN 5-325 MG PO TABS: 1.0000 | ORAL_TABLET | Freq: Four times a day (QID) | ORAL | 0 refills | Status: DC | PRN

## 2017-12-13 NOTE — Telephone Encounter (Signed)
Wife notified that script for norco left at front for p/u.

## 2017-12-13 NOTE — Telephone Encounter (Signed)
Wife ellen calling. States patient is c/o new pain in right hip, since last Friday. Okay while sitting, but painful to ambulate. Has tramadol, but that is not helping. Would like to know if dr Alen Blew would prescribe something stronger. States she did not take him to the E.R. d/t dementia and would  upset patient.

## 2017-12-13 NOTE — Telephone Encounter (Signed)
Norco 5/325 q6hrs #30.

## 2017-12-21 ENCOUNTER — Other Ambulatory Visit: Payer: Self-pay

## 2017-12-21 ENCOUNTER — Other Ambulatory Visit: Payer: Self-pay | Admitting: Oncology

## 2017-12-21 ENCOUNTER — Telehealth: Payer: Self-pay

## 2017-12-21 DIAGNOSIS — C7951 Secondary malignant neoplasm of bone: Secondary | ICD-10-CM

## 2017-12-21 MED ORDER — HYDROMORPHONE HCL 2 MG PO TABS: 2.0000 mg | ORAL_TABLET | ORAL | 0 refills | Status: DC | PRN

## 2017-12-21 NOTE — Telephone Encounter (Signed)
Followed up with patient spouse [name] and she stated that the bone scan and CT have both been scheduled for tomorrow. She will pick up the new prescription at that time. No other questions or concerns.

## 2017-12-21 NOTE — Telephone Encounter (Signed)
-----  Message from Wyatt Portela, MD sent at 12/21/2017 11:55 AM EDT ----- Regarding: RE: right hip pain Lets try Dilaudid 2 mg q4 hrs PRN #30. ----- Message ----- From: Scot Dock, RN Sent: 12/21/2017  11:53 AM EDT To: Wyatt Portela, MD Subject: RE: right hip pain                             Patient has documented allergy to Oxycodone of nausea and vomiting. Would you like me to go forward with the new script or prescribe a diff medication? Thanks  ----- Message ----- From: Wyatt Portela, MD Sent: 12/21/2017  10:54 AM EDT To: Scot Dock, RN Subject: RE: right hip pain                             Tell her the following: 1. Will obtain bone scan ASAP. 2. Will refer to radiation oncology after bone scan.  3. Please Rx Oxycodone 5 mg q4 prn. #30.   Thanks.   ----- Message ----- From: Scot Dock, RN Sent: 12/21/2017  10:42 AM EDT To: Wyatt Portela, MD Subject: right hip pain                                 Patient spouse [name] called to state that the Estelle prescribed 8/26 has not helped with the right hip area pain. Stated that the patient grimaces with all movement and avoids movement due to the pain. She is administering the Norco 2-3x/day. She is concerned about poss stress frx (despite no falls) vs bone met related pain and is wondering if diagnostics and palliative radiation are necessary. Would you like the whole body bone scan to be scheduled earlier than Oct? Please advise. Thanks

## 2017-12-22 ENCOUNTER — Emergency Department (HOSPITAL_COMMUNITY)
Admission: EM | Admit: 2017-12-22 | Discharge: 2017-12-22 | Disposition: A | Discharge status: Home / Self Care | Payer: Medicare Other | Attending: Emergency Medicine | Admitting: Emergency Medicine

## 2017-12-22 ENCOUNTER — Encounter (HOSPITAL_COMMUNITY): Payer: Self-pay

## 2017-12-22 ENCOUNTER — Encounter (HOSPITAL_COMMUNITY)
Admission: RE | Admit: 2017-12-22 | Discharge: 2017-12-22 | Disposition: A | Discharge status: Home / Self Care | Payer: Medicare Other | Source: Ambulatory Visit | Attending: Oncology | Admitting: Oncology

## 2017-12-22 ENCOUNTER — Ambulatory Visit (HOSPITAL_COMMUNITY)
Admission: RE | Admit: 2017-12-22 | Discharge: 2017-12-22 | Disposition: A | Discharge status: Home / Self Care | Payer: Medicare Other | Source: Ambulatory Visit | Attending: Oncology | Admitting: Oncology

## 2017-12-22 ENCOUNTER — Other Ambulatory Visit: Payer: Self-pay | Admitting: Oncology

## 2017-12-22 ENCOUNTER — Other Ambulatory Visit: Payer: Self-pay

## 2017-12-22 ENCOUNTER — Emergency Department (HOSPITAL_COMMUNITY): Payer: Medicare Other

## 2017-12-22 DIAGNOSIS — C7951 Secondary malignant neoplasm of bone: Secondary | ICD-10-CM | POA: Insufficient documentation

## 2017-12-22 DIAGNOSIS — M84551A Pathological fracture in neoplastic disease, right femur, initial encounter for fracture: Secondary | ICD-10-CM | POA: Insufficient documentation

## 2017-12-22 DIAGNOSIS — Z87891 Personal history of nicotine dependence: Secondary | ICD-10-CM | POA: Insufficient documentation

## 2017-12-22 DIAGNOSIS — Z8546 Personal history of malignant neoplasm of prostate: Secondary | ICD-10-CM | POA: Diagnosis not present

## 2017-12-22 DIAGNOSIS — Z79899 Other long term (current) drug therapy: Secondary | ICD-10-CM | POA: Insufficient documentation

## 2017-12-22 DIAGNOSIS — M79651 Pain in right thigh: Secondary | ICD-10-CM | POA: Diagnosis present

## 2017-12-22 MED ORDER — IOPAMIDOL (ISOVUE-300) INJECTION 61%
INTRAVENOUS | Status: AC
  Filled 2017-12-22: qty 30

## 2017-12-22 MED ORDER — IOHEXOL 300 MG/ML  SOLN: 75.0000 mL | Freq: Once | INTRAMUSCULAR | Status: DC | PRN

## 2017-12-22 MED ORDER — HYDROMORPHONE HCL 2 MG PO TABS: 2.0000 mg | ORAL_TABLET | ORAL | 0 refills | Status: DC | PRN

## 2017-12-22 MED ORDER — IOHEXOL 300 MG/ML  SOLN
100.0000 mL | Freq: Once | INTRAMUSCULAR | Status: AC | PRN
  Administered 2017-12-22: 100 mL via INTRAVENOUS

## 2017-12-22 MED ORDER — TECHNETIUM TC 99M MEDRONATE IV KIT
21.7000 | PACK | Freq: Once | INTRAVENOUS | Status: AC | PRN
  Administered 2017-12-22: 21.7 via INTRAVENOUS

## 2017-12-22 MED ORDER — IOPAMIDOL (ISOVUE-300) INJECTION 61%
30.0000 mL | Freq: Once | INTRAVENOUS | Status: AC | PRN
  Administered 2017-12-22: 30 mL via ORAL

## 2017-12-22 NOTE — ED Triage Notes (Signed)
Pt states he was sent over by his PCP. Concerned for left hip fx.

## 2017-12-22 NOTE — ED Notes (Signed)
Pt reports that he has been having RT leg pain since last Friday 6/10 aches. Pt has hx of prostate Cancer.

## 2017-12-22 NOTE — Progress Notes (Signed)
The results of the CT scan and bone scan were reviewed and discussed with the patient's wife over the phone today.  The results show a nondisplaced fracture of his right greater trochanter which raises the possibility of possible pathological fracture.  He is unable to bear weight and ambulate at this time.  Based on these findings, I feel he needs an urgent orthopedic evaluation regarding these findings.  It is unclear whether he will require surgical fixation versus immobilization.  After discussion today with the patient and his wife, I feel the best approach is to have him urgently evaluated in the emergency department by orthopedics and potential hospitalization regarding this new findings.  If surgery is not indicated, will consider palliative radiation therapy for his pain.  The patient and his wife are agreeable with this plan at this time.  This was discussed also with the triage nurse in the emergency department at [facility].

## 2017-12-22 NOTE — Discharge Instructions (Signed)
Your work-up today revealed evidence of a pathologic right femur fracture.  As you are able to walk with a walker, we feel you are safe for discharge home at the recognition of orthopedics.  Please follow-up with Dr. Stann Mainland with orthopedics in the next week.  I wrote your prescription that your oncologist wrote today which you could not get filled for pain management.  Please take it and do not take your other pain medicine.  Please be careful not to fall on the new pain medicine.  If any symptoms change or worsen, please return to the nearest emergency department.

## 2017-12-22 NOTE — ED Provider Notes (Signed)
[place] DEPT Provider Note   CSN: [id number] Arrival date & time: 12/22/17  1204     History   Chief Complaint Chief Complaint  Patient presents with  . Leg Pain    HPI [name] [name] is a 77 y.o. male.  The history is provided by the patient and medical records. No language interpreter was used.  Hip Pain  This is a new problem. The current episode started more than 1 week ago. The problem occurs constantly. The problem has not changed since onset.Pertinent negatives include no chest pain, no abdominal pain, no headaches and no shortness of breath. The symptoms are aggravated by standing and walking. Nothing relieves the symptoms. He has tried nothing for the symptoms. The treatment provided no relief.    Past Medical History:  Diagnosis Date  . Anxiety   . 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 Barrett's esophageal ulceration 2015  . Fatigue   . H/O: GI bleed 2015  . Hyperlipidemia   . Hypertension    denies, has had hypotension  . Metastatic bone tumor ([place]) 11/13/2010   RT VAT , RESECTION OF 3rd,4th ,5th ribs with reconstruction with allograft and titanium platesDR. BURNEY  . Prostate carcinoma ([place])   . PSVT (paroxysmal supraventricular tachycardia) ([place]) 07/2016  . RBBB (right bundle branch block with left anterior fascicular block)   . Tachycardia    history of    Patient Active Problem List   Diagnosis Date Noted  . RBBB (right bundle branch block with left anterior fascicular block)   . AVB (atrioventricular block)   . SVT (supraventricular tachycardia) ([place]) 08/11/2016  . Acute encephalopathy 08/11/2016  . PSVT (paroxysmal supraventricular tachycardia) ([place]) 07/19/2016  . Port catheter in place 08/13/2015  . Upper GI bleed 03/14/2014  . Acute blood loss anemia 03/14/2014  . H/O: GI bleed 04/20/2013  . Prostate carcinoma ([place])   . Hyperlipidemia   . Hypertension   . 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 DENTAL SURGERY    . ESOPHAGOGASTRODUODENOSCOPY N/A 03/14/2014   Procedure: ESOPHAGOGASTRODUODENOSCOPY (EGD);  Surgeon: Rogene Houston, MD;  Location: [facility] SUITE;  Service: Endoscopy;  Laterality: N/A;  . RESECTION BONE TUMOR RIB  [id number]   RT VAT , RESECTION OF 3rd,4th ,5th ribs with reconstruction with allograft and titanium platesDR. BURNEY        Home Medications    Prior to Admission medications   Medication Sig Start Date End Date Taking? Authorizing Provider  acetaminophen (TYLENOL) 325 MG tablet Take 650 mg by mouth every 6 (six) hours as needed for moderate pain.     [provider]  aspirin EC 81 MG tablet Take 81 mg by mouth every morning.     [provider]  Calcium Citrate-Vitamin D (CITRACAL + D PO) Take 2 tablets by mouth every morning.     [provider]  cholecalciferol (VITAMIN D) 1000 units tablet Take 1,000 Units by mouth every morning.     [provider]  ferrous sulfate 325 (65 FE) MG tablet Take 325 mg by mouth daily with breakfast.    [provider]  furosemide (LASIX) 20 MG tablet Take 20 mg by mouth.    [provider]  HYDROcodone-acetaminophen (NORCO/VICODIN) 5-325 MG tablet Take 1 tablet by mouth every 6 (six) hours as needed. 12/13/17   Wyatt Portela, MD  HYDROmorphone (DILAUDID) 2 MG tablet  Take 1 tablet (2 mg total) by mouth every 4 (four) hours as needed for severe pain. 12/21/17   Wyatt Portela, MD  ibuprofen (ADVIL,MOTRIN) 200 MG tablet Take 200 mg by mouth every 4 (four) hours as needed for moderate pain. 11/13/14   [provider]  lidocaine-prilocaine (EMLA) cream Apply 1 application topically as needed. 03/25/16   Wyatt Portela, MD  megestrol (MEGACE) 40 MG/ML suspension TAKE TWO TEASPOONFUL (10 ML) BY MOUTH TWICE DAILY 11/09/17   Wyatt Portela, MD  metoprolol succinate (TOPROL-XL) 25 MG 24 hr tablet Take 1 tablet (25 mg  total) by mouth daily. 08/15/16   Theodis Blaze, MD  pantoprazole (PROTONIX) 40 MG tablet TAKE 1 TABLET BY MOUTH 30 MINUTES BEFORE BREAKFAST AND TAKE 1 TABLET BY MOUTH 30 MINUTES BEFORE SUPPER Patient taking differently: daily 06/24/17   Vaughan Basta, Rona Ravens, NP  potassium chloride SA (K-DUR,KLOR-CON) 20 MEQ tablet TAKE ONE TABLET BY MOUTH EVERY DAY 02/28/17   Wyatt Portela, MD  prochlorperazine (COMPAZINE) 10 MG tablet Take 1 tablet (10 mg total) by mouth every 6 (six) hours as needed for nausea or vomiting. 04/09/17   Wyatt Portela, MD  simvastatin (ZOCOR) 40 MG tablet Take 40 mg by mouth every evening. Reported on 08/14/2015    [provider]  traMADol (ULTRAM) 50 MG tablet Take 1 tablet (50 mg total) by mouth every 6 (six) hours as needed. 04/09/17   Wyatt Portela, MD    Family History Family History  Problem Relation Age of Onset  . Aneurysm Father     Social History Social History   Tobacco Use  . Smoking status: Former Smoker    Packs/day: 2.00    Years: 15.00    Pack years: 30.00    Types: Cigarettes    Last attempt to quit: 04/20/1974    Years since quitting: 43.7  . Smokeless tobacco: Never Used  Substance Use Topics  . Alcohol use: No    Alcohol/week: 0.0 standard drinks  . Drug use: No     Allergies   Oxycodone and Penicillins   Review of Systems Review of Systems  Constitutional: Negative for chills, diaphoresis, fatigue and fever.  HENT: Negative for congestion.   Respiratory: Negative for cough, chest tightness, shortness of breath and wheezing.   Cardiovascular: Negative for chest pain.  Gastrointestinal: Negative for abdominal pain, constipation, diarrhea, nausea, rectal pain and vomiting.  Genitourinary: Negative for dysuria, flank pain and frequency.  Musculoskeletal: Positive for gait problem. Negative for back pain, neck pain and neck stiffness.  Skin: Negative for rash and wound.  Neurological: Negative for light-headedness and headaches.    All other systems reviewed and are negative.    Physical Exam Updated Vital Signs BP 113/82   Pulse (!) 106   Temp 98 F (36.7 C) (Oral)   Resp 18   Ht 5\' 8"  (1.727 m)   Wt 66.1 kg   SpO2 100%   BMI 22.15 kg/m   Physical Exam  Constitutional: He is oriented to person, place, and time. He appears well-developed and well-nourished. No distress.  HENT:  Head: Normocephalic and atraumatic.  Nose: Nose normal.  Mouth/Throat: Oropharynx is clear and moist. No oropharyngeal exudate.  Eyes: Pupils are equal, round, and reactive to light. Conjunctivae and EOM are normal.  Neck: Neck supple.  Cardiovascular: Normal rate and regular rhythm.  No murmur heard. Pulmonary/Chest: Effort normal and breath sounds normal. No respiratory distress. He has no wheezes. He exhibits  no tenderness.  Abdominal: Soft. He exhibits no distension. There is no tenderness.  Musculoskeletal: He exhibits tenderness. He exhibits no edema or deformity.       Right hip: He exhibits tenderness and bony tenderness. He exhibits no swelling, no crepitus, no deformity and no laceration.       Legs: Neurological: He is alert and oriented to person, place, and time. No sensory deficit. He exhibits normal muscle tone.  Skin: Skin is warm and dry. Capillary refill takes less than 2 seconds. He is not diaphoretic. No erythema. No pallor.  Psychiatric: He has a normal mood and affect.  Nursing note and vitals reviewed.    ED Treatments / Results  Labs (all labs ordered are listed, but only abnormal results are displayed) Labs Reviewed - No data to display  EKG None  Radiology Nm Bone Scan Whole Body  Result Date: 12/22/2017 CLINICAL DATA:  77 year old with history of metastatic prostate cancer. EXAM: NUCLEAR MEDICINE WHOLE BODY BONE SCAN TECHNIQUE: Whole body anterior and posterior images were obtained approximately 3 hours after intravenous injection of radiopharmaceutical. RADIOPHARMACEUTICALS:  21.7 mCi  Technetium-35m MDP IV COMPARISON:  Bone scan 05/24/2017 and 01/05/2017.  CT today. FINDINGS: Compared with the previous study, there is new focal uptake at the calvarial vertex suspicious for metastatic disease. There is also increased uptake in the upper thoracic spine on the posterior images and near the left 8th costovertebral junction. There is also increased activity within the proximal right femur involving the greater trochanteric region and proximal diaphysis. Activity in the greater trochanter corresponds with a nondisplaced fracture on today's pelvic CT. Activity in the right ischium and elsewhere in the pelvis appears unchanged. The previously demonstrated probable fracture involving the posterior right 9th rib has healed. There is indeterminate left supraclavicular and proximal right humeral activity on the anterior images. IMPRESSION: 1. Nondisplaced fracture of the right greater trochanter. This demonstrates no definite pathologic features on today's CT. 2. Progressive metastatic disease, especially in the calvarium, upper thoracic spine and proximal right femoral diaphysis. Pelvic metastatic disease is grossly stable. 3. Interval healing of previously demonstrated posterior right rib fracture. Electronically Signed   By: Richardean Sale M.D.   On: 12/22/2017 11:27   Ct Abdomen Pelvis W Contrast  Addendum Date: 12/22/2017   ADDENDUM REPORT: 12/22/2017 11:30 ADDENDUM: Also noted is a nondisplaced fracture involving the right greater trochanter (coronal image 48). Underlying sclerosis in this region raises the possibility of pathologic fracture. Electronically Signed   By: Julian Hy M.D.   On: 12/22/2017 11:30   Result Date: 12/22/2017 CLINICAL DATA:  Castrate resistant prostate cancer EXAM: CT ABDOMEN AND PELVIS WITH CONTRAST TECHNIQUE: Multidetector CT imaging of the abdomen and pelvis was performed using the standard protocol following bolus administration of intravenous contrast.  CONTRAST:  12mL OMNIPAQUE IOHEXOL 300 MG/ML  SOLN COMPARISON:  Whole-body bone scan dated 05/24/2017. CT abdomen/pelvis dated 05/07/2017. FINDINGS: Lower chest: Lung bases are clear. Hepatobiliary: Liver is within normal limits. Gallbladder is unremarkable. No intrahepatic or extrahepatic ductal dilatation. Pancreas: Within normal limits. Spleen: Within normal limits. Adrenals/Urinary Tract: Adrenal glands are within normal limits. Left renal sinus cysts. Right kidney is within normal limits. No hydronephrosis. Bladder is within normal limits. Stomach/Bowel: Stomach is notable for a tiny hiatal hernia. No evidence of bowel obstruction. Normal appendix (series 3/image 49). Moderate colonic stool burden, suggesting constipation. Vascular/Lymphatic: No evidence of abdominal aortic aneurysm. Atherosclerotic calcifications of the abdominal aorta and branch vessels. No suspicious abdominopelvic lymphadenopathy. Reproductive: Prostate  is unremarkable in this patient with history of prostate cancer. Other: No abdominopelvic ascites. Small fat containing bilateral inguinal hernias, right greater than left. Musculoskeletal: Metastasis with dominant soft tissue component involving the right inferior pubic ramus now measures 4.0 x 6.1 cm, previously 2.4 x 3.6 cm when measured in a similar fashion. Sclerotic metastases involving the bilateral iliac bones (series 3/image 46), unchanged. Mild to moderate central compression fracture deformity at L2 (sagittal image 58), unchanged. Moderate superior endplate compression fracture deformity at T12, unchanged. Vertebral hemangioma at L3. IMPRESSION: Progressive metastasis with soft tissue component involving the right inferior pubic ramus, as above. Stable osseous metastases involving the bilateral iliac bones. Correlate with pending bone scan. Stable compression fractures involving T12 and L2, as above. Additional stable ancillary findings as above. Electronically Signed: By: Julian Hy M.D. On: 12/22/2017 11:19   Dg Hip Unilat W Or Wo Pelvis 2-3 Views Right  Result Date: 12/22/2017 CLINICAL DATA:  Right hip pain EXAM: DG HIP (WITH OR WITHOUT PELVIS) 2-3V RIGHT COMPARISON:  None. FINDINGS: There is no evidence of hip fracture or dislocation. Mild narrowing of both femoroacetabular joint spaces. There is dense contrast material in the colon and urinary bladder. 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 Electronically Signed   By: Ulyses Jarred M.D.   On: 12/22/2017 14:57   Dg Femur Min 2 Views Right  Result Date: 12/22/2017 CLINICAL DATA:  Right lower extremity pain EXAM: RIGHT FEMUR 2 VIEWS COMPARISON:  None. FINDINGS: There is no femoral fracture. No dislocation at the right knee or hip. No focal osseous lesions. IMPRESSION: Normal right femur. Electronically Signed   By: Ulyses Jarred M.D.   On: 12/22/2017 14:59    Procedures Procedures (including critical care time)  Medications Ordered in ED Medications - No data to display   Initial Impression / Assessment and Plan / ED Course  I have reviewed the triage vital signs and the nursing notes.  Pertinent labs & imaging results that were available during my care of the patient were reviewed by me and considered in my medical decision making (see chart for details).     [name] [name] is a 77 y.o. male with a past medical history significant for prostate cancer with metastasis, hypertension, hyperlipidemia, and prior GI bleed who presents from his oncologist office for pathologic right hip fracture.  According to patient and wife, patient has had pain in his right hip for the last 2 weeks.  She denies any trauma.  Patient says that he saw his doctor several days ago and then had imaging today to further evaluate.  The imaging returned showing pathologic fracture of the right hip.  Wife reports that patient has been able to ambulate with extreme difficulty and severe pain.   Patient is not having his pain controlled with home hydrocodone.  Orthopedic doctor sent him to the ED for orthopedic evaluation and discussion of possible admission or management.   On my exam, patient had pain with any right hip manipulation.  Normal sensation in the legs.  Decreased strength due to pain.  Palpable pulse was seen.  No discoloration of the leg.  Lungs clear and chest was nontender.  Abdomen was nontender.  No left-sided pain or tenderness.   With the CT scan showing pathologic fracture of the right greater trochanter, orthopedics will be called.  Anticipate following up on the recognitions.  Wife says that she would be amenable if he needs to be admitted for orthopedic  management and PT/OT.  When he is not been moved, pain is mild.  1:50 PM Spoke with Dr. Victorino December with orthopedics who requested x-ray of the right femur and right hip.  He felt that the patient was able to ambulate with a walker and have minimal weightbearing, he would be candidate for discharge home for outpatient pain management and follow-up in clinic.    Family agreed with this plan.  Patient will get the x-rays and then be reassessed.  Anticipate discharge.  3:45 PM X-rays did not show any other significant ab normalities.    Family reports that their PCP wrote a prescription for Dilaudid for pain control for this pathologic fracture but they were unable to go over to pick it up before his office is closing.  Thus, I will write for Dilaudid for pain management in the setting of this cancer patient with pathologic fracture.    Patient will follow-up with orthopedics as we discussed and understood return precautions.  Patient will use a walker and be minimal weightbearing.    Patient and family had no depressions or concerns and patient was discharged in good condition.   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 He/she/they expressed understanding and agree(s) with the plan. Discharge instructions discussed at great length. Strict return precautions discussed and pt &/or family have verbalized understanding of the instructions. No further questions at time of discharge.    New Prescriptions   HYDROMORPHONE (DILAUDID) 2 MG TABLET    Take 1 tablet (2 mg total) by mouth every 4 (four) hours as needed for severe pain.    Follow Up: Nicholes Stairs, [place] [id number] [phone]        Eliya Bubar, Gwenyth Allegra, MD 12/22/17 [phone]

## 2017-12-22 NOTE — ED Triage Notes (Signed)
Dr. Osker Mason phones at this time to tell us that this pt. Plans to come to our facility today to be evaluated for right hip pain. Pt. Has hx of prostate cancer and pathological fracture of the hip is suspected. Pt. Is coming from [place], [place] and wishes orthopedic eval.

## 2018-01-04 DIAGNOSIS — S72113A Displaced fracture of greater trochanter of unspecified femur, initial encounter for closed fracture: Secondary | ICD-10-CM | POA: Insufficient documentation

## 2018-01-13 NOTE — Progress Notes (Signed)
Histology and Location of Primary Cancer: castration-resistant prostate cancer diagnosed in 2014 after initial diagnosis of advanced prostate cancer in 2012  Sites of Visceral and Bony Metastatic Disease:  1. Nondisplaced fracture of the right greater trochanter. This demonstrates no definite pathologic features on today's CT. 2. Progressive metastatic disease, especially in the calvarium, upper thoracic spine and proximal right femoral diaphysis. Pelvic metastatic disease is grossly stable. 3. Interval healing of previously demonstrated posterior right rib fracture.  Location(s) of Symptomatic Metastases: pathologic fracture of right greater trochanter.  Past/Anticipated chemotherapy by medical oncology, if any:  Prior Therapy:  He underwent a right VATS procedure and resection of the third, fourth and fifth rib and reconstruction with titanium plates under the care of Dr. Arlyce Dice on November 13 2010.  He S/P androgen deprivation therapy under the care of Dr. Gaynelle Arabian. His PSA nadir was to 0.1 back in May of 2013 and subsequently started to rise.  He was treated with Provenge in the fall of 2014.   [name] was started in February of 2015 and discontinued in 09/2013 due to progression of disease and poor tolerance.  Taxotere chemotherapy started on 10/05/2013. He is status post 16 cycles of therapy last one given in April 2016 with a PSA reduction down to 0.44.  Zytiga 1000 mg daily started in June 2017.  Therapy discontinued in December 2018 due to progression of disease.  Current therapy: Active surveillance and supportive care.   Pain on a scale of 0-10 is: Denies pain presently. Wife reports patient hasn't grimaced in pain in over a week. Wife reports the patient hasn't had pain medication (tramadol, vicodin or dilaudid) in over a week.    If Spine Met(s), symptoms, if any, include:  Bowel/Bladder retention or incontinence (please describe): Wear depends.  Numbness or weakness  in extremities (please describe): Ambulates only with walker when necessary to toilet or dress.  Current Decadron regimen, if applicable: no  Ambulatory status? Walker? Wheelchair?: Uses walker to ambulate on when necessary for toileting or dressing.   SAFETY ISSUES:  Prior radiation? no  Pacemaker/ICD? no  Possible current pregnancy? no  Is the patient on methotrexate? no  Current Complaints / other details:  77 year old male. Married (for 67 years) with two grown sons. Resides in [place] with wife, 3 dogs, and 12 cats.   Evaluated by Dr. Francee Piccolo (orthopedic) and told surgery wasn't necessary, to rest the leg and ambulate with walker only when necessary.   Reports managing constipation with Miralax. Reports managing poor appetite with Megace. Wife denies any falls.

## 2018-01-17 ENCOUNTER — Ambulatory Visit
Admission: RE | Admit: 2018-01-17 | Discharge: 2018-01-17 | Disposition: A | Discharge status: Home / Self Care | Payer: Medicare Other | Source: Ambulatory Visit

## 2018-01-17 ENCOUNTER — Ambulatory Visit
Admission: RE | Admit: 2018-01-17 | Discharge: 2018-01-17 | Disposition: A | Discharge status: Home / Self Care | Payer: Medicare Other | Source: Ambulatory Visit | Attending: Radiation Oncology | Admitting: Radiation Oncology

## 2018-01-17 ENCOUNTER — Encounter: Payer: Self-pay | Admitting: Medical Oncology

## 2018-01-17 ENCOUNTER — Other Ambulatory Visit: Payer: Self-pay

## 2018-01-17 ENCOUNTER — Encounter: Payer: Self-pay | Admitting: Radiation Oncology

## 2018-01-17 VITALS — BP 103/74 | HR 104 | Temp 97.8°F | Resp 24 | Ht 68.0 in | Wt 143.6 lb

## 2018-01-17 DIAGNOSIS — C61 Malignant neoplasm of prostate: Secondary | ICD-10-CM | POA: Insufficient documentation

## 2018-01-17 DIAGNOSIS — F0390 Unspecified dementia without behavioral disturbance: Secondary | ICD-10-CM | POA: Insufficient documentation

## 2018-01-17 DIAGNOSIS — C7951 Secondary malignant neoplasm of bone: Secondary | ICD-10-CM | POA: Insufficient documentation

## 2018-01-17 DIAGNOSIS — Z79899 Other long term (current) drug therapy: Secondary | ICD-10-CM | POA: Insufficient documentation

## 2018-01-17 DIAGNOSIS — Z87891 Personal history of nicotine dependence: Secondary | ICD-10-CM | POA: Insufficient documentation

## 2018-01-17 DIAGNOSIS — Z885 Allergy status to narcotic agent status: Secondary | ICD-10-CM | POA: Insufficient documentation

## 2018-01-17 DIAGNOSIS — Z880 Allergy status to penicillin: Secondary | ICD-10-CM | POA: Insufficient documentation

## 2018-01-17 DIAGNOSIS — C7952 Secondary malignant neoplasm of bone marrow: Principal | ICD-10-CM

## 2018-01-17 DIAGNOSIS — I10 Essential (primary) hypertension: Secondary | ICD-10-CM | POA: Diagnosis not present

## 2018-01-17 DIAGNOSIS — R4182 Altered mental status, unspecified: Secondary | ICD-10-CM

## 2018-01-17 DIAGNOSIS — Z7982 Long term (current) use of aspirin: Secondary | ICD-10-CM | POA: Diagnosis not present

## 2018-01-17 DIAGNOSIS — G893 Neoplasm related pain (acute) (chronic): Secondary | ICD-10-CM

## 2018-01-17 DIAGNOSIS — E785 Hyperlipidemia, unspecified: Secondary | ICD-10-CM | POA: Diagnosis not present

## 2018-01-17 DIAGNOSIS — T40605A Adverse effect of unspecified narcotics, initial encounter: Secondary | ICD-10-CM

## 2018-01-17 HISTORY — DX: Unspecified dementia, unspecified severity, without behavioral disturbance, psychotic disturbance, mood disturbance, and anxiety: F03.90

## 2018-01-17 NOTE — Progress Notes (Signed)
[name] [name]  diagnosed with prostate cancer in 2012. He was recently diagnosed with a fracture of right greater trochanter after having pain. His wife (caregiver)  states he took Dilaudid for a week but no longer having pain. He did see an orthopedic surgery but the surgeon did not recommend surgery, only no weight bearing and rest. He is seeing Dr. Tammi Klippel today to discuss radiation.I gave his wife my business card and asked her to call me with questions or concerns.

## 2018-01-17 NOTE — Progress Notes (Signed)
See progress note under physician encounter. 

## 2018-01-17 NOTE — Progress Notes (Signed)
Radiation Oncology         [phone]) [phone] ________________________________  Initial outpatient Consultation  Name: [name] [name] MRN: [id number]  Date of Service: 01/17/2018 DOB: 1940/11/04  CC:Rory Percy, MD  Wyatt Portela, MD   REFERRING PHYSICIAN: Wyatt Portela, MD  DIAGNOSIS: 77 y.o. gentleman with castrate-resistant metastatic prostate cancer.  We met with the patient and his wife and outlined the history and work up of his right hip pain    ICD-10-CM   1. Prostate carcinoma ([place]) C61   2. Metastatic bone tumor ([place]) C79.51     HISTORY OF PRESENT ILLNESS: [name] [name] is a 77 y.o. male seen at the request of Dr. Alen Blew. He has a history of castration-resistant prostate cancer. He was seen by Dr. Valere Dross on 09/13/13 and has been followed by Dr. Alen Blew since 09/19/13. The patient was initially diagnosed with metastatic prostate cancer with biopsy of a right rib metastasis by Dr. Arlyce Dice in 10/2010. His prior treatment regimen has included provenge, xtandi, taxotere, and recently Zytiga which was discontinued in 03/2017 due to disease progression. His bone scan in February revealed multiple sites of metastases including a stable unchanged deposit in the right pubic ramus. He has been followed and reported hip pain to his wife who is also his caregiver. This was worked up and on 12/21/17 and was prescribed pain medication.  Bone scan on 12/22/17 revealed: nondisplaced fracture of the right greater trochanter and progressive metastatic disease, especially in the calvarium, upper thoracic spine and proximal right femoral diaphysis. Pelvic metastatic disease is grossly stable. The fracture was determined to be pathological despite negative x-rays at that time. He comes today to discuss the use of palliative radiotherapy. He was seen by Dr. Francee Piccolo in orthopedics as well and was not felt to be a surgical candidate.   PREVIOUS RADIATION THERAPY: No  PAST MEDICAL HISTORY:  Past Medical History:   Diagnosis Date  . Anxiety   . 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 Barrett's esophageal ulceration 2015  . Dementia   . Fatigue   . H/O: GI bleed 2015  . Hyperlipidemia   . Hypertension    denies, has had hypotension  . Metastatic bone tumor ([place]) 11/13/2010   RT VAT , RESECTION OF 3rd,4th ,5th ribs with reconstruction with allograft and titanium platesDR. BURNEY  . Prostate carcinoma ([place])   . PSVT (paroxysmal supraventricular tachycardia) ([place]) 07/2016  . RBBB (right bundle branch block with left anterior fascicular block)   . Tachycardia    history of      PAST SURGICAL HISTORY: Past Surgical History:  Procedure Laterality Date  . DENTAL SURGERY    . ESOPHAGOGASTRODUODENOSCOPY N/A 03/14/2014   Procedure: ESOPHAGOGASTRODUODENOSCOPY (EGD);  Surgeon: Rogene Houston, MD;  Location: [facility];  Service: Endoscopy;  Laterality: N/A;  . RESECTION BONE TUMOR RIB  [id number]   RT VAT , RESECTION OF 3rd,4th ,5th ribs with reconstruction with allograft and titanium platesDR. BURNEY    FAMILY HISTORY:  Family History  Problem Relation Age of Onset  . Aneurysm Father     SOCIAL HISTORY:  Social History   Socioeconomic History  . Marital status: Married    Spouse name: Not on file  . Number of children: 2  . Years of education: Not on file  . Highest education level: Not on file  Occupational History  . Not on file  Social Needs  . Financial resource strain: Not on file  .  Food insecurity:    Worry: Not on file    Inability: Not on file  . Transportation needs:    Medical: Not on file    Non-medical: Not on file  Tobacco Use  . Smoking status: Former Smoker    Packs/day: 2.00    Years: 15.00    Pack years: 30.00    Types: Cigarettes    Last attempt to quit: 04/20/1974    Years since quitting: 43.7  . Smokeless tobacco: Never Used  Substance and Sexual Activity  . Alcohol use: No    Alcohol/week: 0.0 standard drinks  . Drug use: No  . Sexual  activity: Not Currently  Lifestyle  . Physical activity:    Days per week: Not on file    Minutes per session: Not on file  . Stress: Not on file  Relationships  . Social connections:    Talks on phone: Not on file    Gets together: Not on file    Attends religious service: Not on file    Active member of club or organization: Not on file    Attends meetings of clubs or organizations: Not on file    Relationship status: Not on file  . Intimate partner violence:    Fear of current or ex partner: Not on file    Emotionally abused: Not on file    Physically abused: Not on file    Forced sexual activity: Not on file  Other Topics Concern  . Not on file  Social History Narrative   Resides at home with wife. Have two grown sons. Worked as a Dealer and in a Acupuncturist. Has 3 dogs and 12 cats.   The patient is married and lives in [place]. He is accompanied by his wife.  ALLERGIES: Oxycodone and Penicillins  MEDICATIONS:  Current Outpatient Medications  Medication Sig Dispense Refill  . aspirin EC 81 MG tablet Take 81 mg by mouth every morning.     . Calcium Citrate-Vitamin D (CITRACAL + D PO) Take 2 tablets by mouth every morning.     . cholecalciferol (VITAMIN D) 1000 units tablet Take 1,000 Units by mouth every morning.     . ferrous sulfate 325 (65 FE) MG tablet Take 325 mg by mouth daily with breakfast.    . furosemide (LASIX) 20 MG tablet Take 20 mg by mouth.    Marland Kitchen ibuprofen (ADVIL,MOTRIN) 200 MG tablet Take 200 mg by mouth every 4 (four) hours as needed for moderate pain.    Marland Kitchen lidocaine-prilocaine (EMLA) cream Apply 1 application topically as needed. 30 g 2  . megestrol (MEGACE) 40 MG/ML suspension TAKE TWO TEASPOONFUL (10 ML) BY MOUTH TWICE DAILY 240 mL 0  . metoprolol succinate (TOPROL-XL) 25 MG 24 hr tablet Take 1 tablet (25 mg total) by mouth daily. 30 tablet 0  . pantoprazole (PROTONIX) 40 MG tablet TAKE 1 TABLET BY MOUTH 30 MINUTES BEFORE BREAKFAST AND TAKE 1 TABLET BY MOUTH  30 MINUTES BEFORE SUPPER (Patient taking differently: Take 40 mg by mouth daily. ) 60 tablet 11  . potassium chloride SA (K-DUR,KLOR-CON) 20 MEQ tablet TAKE ONE TABLET BY MOUTH EVERY DAY (Patient taking differently: Take 20 mEq by mouth daily. ) 30 tablet 0  . silver sulfADIAZINE (SSD) 1 % cream SSD 1 % topical cream    . simvastatin (ZOCOR) 40 MG tablet Take 40 mg by mouth every evening. Reported on 08/14/2015    . acetaminophen (TYLENOL) 325 MG tablet Take 650 mg by  mouth every 6 (six) hours as needed for moderate pain.      No current facility-administered medications for this encounter.     REVIEW OF SYSTEMS:  On review of systems, the patient reports that he is doing fair overall. He denies any chest pain, shortness of breath, cough, fevers, chills, night sweats. He denies any bowel or bladder disturbances, and denies abdominal pain, nausea or vomiting. He continues to have fatigue and unintended weight loss of about 25 pounds. He denies tingling or pain to his right hip and right leg. He notes a mild, burning pain to his back that has never spread anywhere else. He notes his symptoms as more painful when walking or standing upright. He has been avoiding this at the recommendation of orthopedics, and mainly sitting or laying at home.  A complete review of systems is obtained and is otherwise negative.    PHYSICAL EXAM:  Wt Readings from Last 3 Encounters:  01/17/18 143 lb 9.6 oz (65.1 kg)  12/22/17 145 lb 11.2 oz (66.1 kg)  12/07/17 145 lb 11.2 oz (66.1 kg)   Temp Readings from Last 3 Encounters:  01/17/18 97.8 F (36.6 C) (Oral)  12/22/17 98 F (36.7 C) (Oral)  12/07/17 97.6 F (36.4 C) (Oral)   BP Readings from Last 3 Encounters:  01/17/18 103/74  12/22/17 114/79  12/07/17 111/78   Pulse Readings from Last 3 Encounters:  01/17/18 (!) 104  12/22/17 90  12/07/17 (!) 101   Pain Assessment Pain Score: 0-No pain/10  In general this is a chronically ill appearing Caucasian  gentleman in no acute distress. He is alert and oriented x4 and appropriate throughout the examination. HEENT reveals that the patient is normocephalic, atraumatic. EOMs are intact. Skin is intact without any evidence of gross lesions. Cardiopulmonary assessment is negative for acute distress and he exhibits normal effort. He is seated in a wheelchair.   KPS = 50  100 - Normal; no complaints; no evidence of disease. 90   - Able to carry on normal activity; minor signs or symptoms of disease. 80   - Normal activity with effort; some signs or symptoms of disease. 36   - Cares for self; unable to carry on normal activity or to do active work. 60   - Requires occasional assistance, but is able to care for most of his personal needs. 50   - Requires considerable assistance and frequent medical care. 95   - Disabled; requires special care and assistance. 12   - Severely disabled; hospital admission is indicated although death not imminent. 75   - Very sick; hospital admission necessary; active supportive treatment necessary. 10   - Moribund; fatal processes progressing rapidly. 0     - Dead  Karnofsky DA, Abelmann Downing, Craver LS and [place] [facility] [phone]) The use of the nitrogen mustards in the palliative treatment of carcinoma: with particular reference to bronchogenic carcinoma Cancer 1 634-56  LABORATORY DATA:  Lab Results  Component Value Date   WBC 8.7 11/30/2017   HGB 13.5 11/30/2017   HCT 40.8 11/30/2017   MCV 94.4 11/30/2017   PLT 308 11/30/2017   Lab Results  Component Value Date   NA 139 11/30/2017   K 4.0 11/30/2017   CL 104 11/30/2017   CO2 24 11/30/2017   Lab Results  Component Value Date   ALT 16 11/30/2017   AST 18 11/30/2017   ALKPHOS 83 11/30/2017   BILITOT 0.5 11/30/2017     RADIOGRAPHY: Nm Bone  Scan Whole Body  Result Date: 12/22/2017 CLINICAL DATA:  77 year old with history of metastatic prostate cancer. EXAM: NUCLEAR MEDICINE WHOLE BODY BONE SCAN TECHNIQUE:  Whole body anterior and posterior images were obtained approximately 3 hours after intravenous injection of radiopharmaceutical. RADIOPHARMACEUTICALS:  21.7 mCi Technetium-15mMDP IV COMPARISON:  Bone scan 05/24/2017 and 01/05/2017.  CT today. FINDINGS: Compared with the previous study, there is new focal uptake at the calvarial vertex suspicious for metastatic disease. There is also increased uptake in the upper thoracic spine on the posterior images and near the left 8th costovertebral junction. There is also increased activity within the proximal right femur involving the greater trochanteric region and proximal diaphysis. Activity in the greater trochanter corresponds with a nondisplaced fracture on today's pelvic CT. Activity in the right ischium and elsewhere in the pelvis appears unchanged. The previously demonstrated probable fracture involving the posterior right 9th rib has healed. There is indeterminate left supraclavicular and proximal right humeral activity on the anterior images. IMPRESSION: 1. Nondisplaced fracture of the right greater trochanter. This demonstrates no definite pathologic features on today's CT. 2. Progressive metastatic disease, especially in the calvarium, upper thoracic spine and proximal right femoral diaphysis. Pelvic metastatic disease is grossly stable. 3. Interval healing of previously demonstrated posterior right rib fracture. Electronically Signed   By: WRichardean SaleM.D.   On: 12/22/2017 11:27   Ct Abdomen Pelvis W Contrast  Addendum Date: 12/22/2017   ADDENDUM REPORT: 12/22/2017 11:30 ADDENDUM: Also noted is a nondisplaced fracture involving the right greater trochanter (coronal image 48). Underlying sclerosis in this region raises the possibility of pathologic fracture. Electronically Signed   By: SJulian HyM.D.   On: 12/22/2017 11:30   Result Date: 12/22/2017 CLINICAL DATA:  Castrate resistant prostate cancer EXAM: CT ABDOMEN AND PELVIS WITH CONTRAST  TECHNIQUE: Multidetector CT imaging of the abdomen and pelvis was performed using the standard protocol following bolus administration of intravenous contrast. CONTRAST:  1017mOMNIPAQUE IOHEXOL 300 MG/ML  SOLN COMPARISON:  Whole-body bone scan dated 05/24/2017. CT abdomen/pelvis dated 05/07/2017. FINDINGS: Lower chest: Lung bases are clear. Hepatobiliary: Liver is within normal limits. Gallbladder is unremarkable. No intrahepatic or extrahepatic ductal dilatation. Pancreas: Within normal limits. Spleen: Within normal limits. Adrenals/Urinary Tract: Adrenal glands are within normal limits. Left renal sinus cysts. Right kidney is within normal limits. No hydronephrosis. Bladder is within normal limits. Stomach/Bowel: Stomach is notable for a tiny hiatal hernia. No evidence of bowel obstruction. Normal appendix (series 3/image 49). Moderate colonic stool burden, suggesting constipation. Vascular/Lymphatic: No evidence of abdominal aortic aneurysm. Atherosclerotic calcifications of the abdominal aorta and branch vessels. No suspicious abdominopelvic lymphadenopathy. Reproductive: Prostate is unremarkable in this patient with history of prostate cancer. Other: No abdominopelvic ascites. Small fat containing bilateral inguinal hernias, right greater than left. Musculoskeletal: Metastasis with dominant soft tissue component involving the right inferior pubic ramus now measures 4.0 x 6.1 cm, previously 2.4 x 3.6 cm when measured in a similar fashion. Sclerotic metastases involving the bilateral iliac bones (series 3/image 46), unchanged. Mild to moderate central compression fracture deformity at L2 (sagittal image 58), unchanged. Moderate superior endplate compression fracture deformity at T12, unchanged. Vertebral hemangioma at L3. IMPRESSION: Progressive metastasis with soft tissue component involving the right inferior pubic ramus, as above. Stable osseous metastases involving the bilateral iliac bones. Correlate with  pending bone scan. Stable compression fractures involving T12 and L2, as above. Additional stable ancillary findings as above. Electronically Signed: By: SrJulian Hy.D. On: 12/22/2017 11:19  Dg Hip Unilat W Or Wo Pelvis 2-3 Views Right  Result Date: 12/22/2017 CLINICAL DATA:  Right hip pain EXAM: DG HIP (WITH OR WITHOUT PELVIS) 2-3V RIGHT COMPARISON:  None. FINDINGS: There is no evidence of hip fracture or dislocation. Mild narrowing of both femoroacetabular joint spaces. There is dense contrast material in the colon and urinary bladder. 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 Electronically Signed   By: Ulyses Jarred M.D.   On: 12/22/2017 14:57   Dg Femur Min 2 Views Right  Result Date: 12/22/2017 CLINICAL DATA:  Right lower extremity pain EXAM: RIGHT FEMUR 2 VIEWS COMPARISON:  None. FINDINGS: There is no femoral fracture. No dislocation at the right knee or hip. No focal osseous lesions. IMPRESSION: Normal right femur. Electronically Signed   By: Ulyses Jarred M.D.   On: 12/22/2017 14:59      IMPRESSION/PLAN: 1. 77 y.o. gentleman with castrate-resistant metastatic prostate cancer.  We met with the patient and his wife and outlined the history and work up of his right hip pain. He reviews that although the patient's symptoms have improved, without treatment Dr. Tammi Klippel would be concerned of further destruction of the pubic rami and femur without therapy, including risk of further fracture and worsening pain. He outlined the rationale of how radiotherapy works in metastatic prostate cancer.  We discussed the available radiation techniques, and focused on the details of logistics and delivery.  We reviewed the anticipated acute and late sequelae associated with radiation in this setting. The patient and his wife are in agreement. Though they live in [place] and could meet with [facility]iation Staff at [place] center, they decline and wish to pursue  treatment here in [place]. CT simulation is scheduled for Thursday, 01/20/18 at 2 pm. 2. Pain secondary to #1. The patient's wife states she does have enough pain medication, and will try to avoid Dilaudid as it made him confused. We will follow this expectantly as he also has Norco to use. If this doesn't help perhaps a smaller dose of Oxycodone could be used. 3. Dementia. The patient is aware of his disease and our discussion, though his confusion occurs with narcotic medication, particularly Dilaudid, and when going to an unfamiliar place, particularly the hospital. His wife will continue to bring him and coordinate his treatment with staff to minimize unnecessary wait time.  In a visit lasting 60 minutes, greater than 50% of the time was spent face to face discussing his case, and coordinating the patient's care.     Carola Rhine, PAC    Tyler Pita, MD  [facility] Direct Dial: [phone]  Fax: [phone] [email or web address]  Skype  [email or web address]   This document serves as a record of services personally performed by Shona Simpson, PA-C and Dr. Tammi Klippel. It was created on their behalf by Wilburn Mylar, a trained medical scribe. The creation of this record is based on the scribe's personal observations and the provider's statements to them. This document has been checked and approved by the attending provider.

## 2018-01-20 ENCOUNTER — Ambulatory Visit
Admission: RE | Admit: 2018-01-20 | Discharge: 2018-01-20 | Disposition: A | Discharge status: Home / Self Care | Payer: Medicare Other | Source: Ambulatory Visit | Attending: Radiation Oncology | Admitting: Radiation Oncology

## 2018-01-20 DIAGNOSIS — C7951 Secondary malignant neoplasm of bone: Secondary | ICD-10-CM | POA: Insufficient documentation

## 2018-01-20 DIAGNOSIS — Z510 Encounter for antineoplastic radiation therapy: Secondary | ICD-10-CM | POA: Insufficient documentation

## 2018-01-20 DIAGNOSIS — C61 Malignant neoplasm of prostate: Secondary | ICD-10-CM | POA: Diagnosis present

## 2018-01-21 ENCOUNTER — Other Ambulatory Visit: Payer: Self-pay | Admitting: Oncology

## 2018-01-21 DIAGNOSIS — C61 Malignant neoplasm of prostate: Secondary | ICD-10-CM

## 2018-01-21 NOTE — Progress Notes (Signed)
  Radiation Oncology         (336) [phone] ________________________________  Name: [name] [name] MRN: [id number]  Date: 01/20/2018  DOB: Mar 16, 1941  SIMULATION AND TREATMENT PLANNING NOTE    ICD-10-CM   1. Metastatic bone tumor ([place]) C79.51   2. Prostate carcinoma ([place]) C61     DIAGNOSIS:  77 y.o. gentleman with castrate-resistant metastatic prostate cancer and painful right hip met  NARRATIVE:  The patient was brought to the [facility].  Identity was confirmed.  All relevant records and images related to the planned course of therapy were reviewed.  The patient freely provided informed written consent to proceed with treatment after reviewing the details related to the planned course of therapy. The consent form was witnessed and verified by the simulation staff.  Then, the patient was set-up in a stable reproducible  supine position for radiation therapy.  CT images were obtained.  Surface markings were placed.  The CT images were loaded into the planning software.  Then the target and avoidance structures were contoured.  Treatment planning then occurred.  The radiation prescription was entered and confirmed.  Then, I designed and supervised the construction of a total of 3 medically necessary complex treatment devices consisting of leg positioner and MLC apertures to cover the treated hip area.  I have requested : 3D Simulation  I have requested a DVH of the following structures: Rectum, Bladder, femoral heads and target.  PLAN:  The patient will receive 30 Gy in 10 fractions.  ________________________________  Sheral Apley Tammi Klippel, M.D.

## 2018-01-23 DIAGNOSIS — C7951 Secondary malignant neoplasm of bone: Secondary | ICD-10-CM | POA: Diagnosis not present

## 2018-01-24 ENCOUNTER — Ambulatory Visit
Admission: RE | Admit: 2018-01-24 | Discharge: 2018-01-24 | Disposition: A | Discharge status: Home / Self Care | Payer: Medicare Other | Source: Ambulatory Visit | Attending: Radiation Oncology | Admitting: Radiation Oncology

## 2018-01-24 DIAGNOSIS — C7951 Secondary malignant neoplasm of bone: Secondary | ICD-10-CM | POA: Diagnosis not present

## 2018-01-25 ENCOUNTER — Ambulatory Visit
Admission: RE | Admit: 2018-01-25 | Discharge: 2018-01-25 | Disposition: A | Discharge status: Home / Self Care | Payer: Medicare Other | Source: Ambulatory Visit | Attending: Radiation Oncology | Admitting: Radiation Oncology

## 2018-01-25 DIAGNOSIS — C7951 Secondary malignant neoplasm of bone: Secondary | ICD-10-CM | POA: Diagnosis not present

## 2018-01-26 ENCOUNTER — Ambulatory Visit
Admission: RE | Admit: 2018-01-26 | Discharge: 2018-01-26 | Disposition: A | Discharge status: Home / Self Care | Payer: Medicare Other | Source: Ambulatory Visit | Attending: Radiation Oncology | Admitting: Radiation Oncology

## 2018-01-26 DIAGNOSIS — C7951 Secondary malignant neoplasm of bone: Secondary | ICD-10-CM | POA: Diagnosis not present

## 2018-01-27 ENCOUNTER — Ambulatory Visit
Admission: RE | Admit: 2018-01-27 | Discharge: 2018-01-27 | Disposition: A | Discharge status: Home / Self Care | Payer: Medicare Other | Source: Ambulatory Visit | Attending: Radiation Oncology | Admitting: Radiation Oncology

## 2018-01-27 DIAGNOSIS — C7951 Secondary malignant neoplasm of bone: Secondary | ICD-10-CM | POA: Diagnosis not present

## 2018-01-28 ENCOUNTER — Ambulatory Visit
Admission: RE | Admit: 2018-01-28 | Discharge: 2018-01-28 | Disposition: A | Discharge status: Home / Self Care | Payer: Medicare Other | Source: Ambulatory Visit | Attending: Radiation Oncology | Admitting: Radiation Oncology

## 2018-01-28 DIAGNOSIS — C7951 Secondary malignant neoplasm of bone: Secondary | ICD-10-CM | POA: Diagnosis not present

## 2018-01-31 ENCOUNTER — Ambulatory Visit
Admission: RE | Admit: 2018-01-31 | Discharge: 2018-01-31 | Disposition: A | Discharge status: Home / Self Care | Payer: Medicare Other | Source: Ambulatory Visit | Attending: Radiation Oncology | Admitting: Radiation Oncology

## 2018-01-31 DIAGNOSIS — C7951 Secondary malignant neoplasm of bone: Secondary | ICD-10-CM | POA: Diagnosis not present

## 2018-02-01 ENCOUNTER — Other Ambulatory Visit: Payer: Self-pay | Admitting: Urology

## 2018-02-01 ENCOUNTER — Ambulatory Visit
Admission: RE | Admit: 2018-02-01 | Discharge: 2018-02-01 | Disposition: A | Discharge status: Home / Self Care | Payer: Medicare Other | Source: Ambulatory Visit | Attending: Radiation Oncology | Admitting: Radiation Oncology

## 2018-02-01 DIAGNOSIS — C7951 Secondary malignant neoplasm of bone: Secondary | ICD-10-CM | POA: Diagnosis not present

## 2018-02-01 MED ORDER — HYDROMORPHONE HCL 2 MG PO TABS: 2.0000 mg | ORAL_TABLET | ORAL | 0 refills | Status: DC | PRN

## 2018-02-01 MED ORDER — HYDROCODONE-ACETAMINOPHEN 5-325 MG PO TABS: 1.0000 | ORAL_TABLET | Freq: Four times a day (QID) | ORAL | 0 refills | Status: DC | PRN

## 2018-02-02 ENCOUNTER — Ambulatory Visit
Admission: RE | Admit: 2018-02-02 | Discharge: 2018-02-02 | Disposition: A | Discharge status: Home / Self Care | Payer: Medicare Other | Source: Ambulatory Visit | Attending: Radiation Oncology | Admitting: Radiation Oncology

## 2018-02-02 DIAGNOSIS — C7951 Secondary malignant neoplasm of bone: Secondary | ICD-10-CM | POA: Diagnosis not present

## 2018-02-02 DIAGNOSIS — C61 Malignant neoplasm of prostate: Secondary | ICD-10-CM

## 2018-02-02 NOTE — Progress Notes (Signed)
  Radiation Oncology         [phone]) [phone] ________________________________  Name: [name] [name] MRN: [id number]  Date: 02/02/2018  DOB: 09-22-1940  SIMULATION AND TREATMENT PLANNING NOTE    ICD-10-CM   1. Prostate carcinoma ([place]) C61     DIAGNOSIS:  77 y.o. gentleman with castrate-resistant metastatic prostate cancer and paraspinal painful osseous mets  NARRATIVE:  The patient was brought to the [facility].  Identity was confirmed.  All relevant records and images related to the planned course of therapy were reviewed.  The patient freely provided informed written consent to proceed with treatment after reviewing the details related to the planned course of therapy. The consent form was witnessed and verified by the simulation staff.  Then, the patient was set-up in a stable reproducible  supine position for radiation therapy.  CT images were obtained.  Surface markings were placed.  The CT images were loaded into the planning software.  Then the target and avoidance structures were contoured including lungs.  Treatment planning then occurred.  The radiation prescription was entered and confirmed.  Then, I designed and supervised the construction of a total of 3 medically necessary complex treatment devices with VacLoc positioner and 2 MLCs to shield lungs.  I have requested : 3D Simulation  I have requested a DVH of the following structures: Left Lung, Right Lung and target.  PLAN:  The patient will receive 30 Gy in 10 fractions.  I planned to treat the left 8th vertebral level and rib based on her bone scan, however, his planning scan demonstrated an additional paraspinal lesion on the left higher at approximately T4 with some extension to involve the edge of the spinal canal.  This will also be treated.  ________________________________  Sheral Apley. Tammi Klippel, M.D.   This document serves as a record of services personally performed by Tyler Pita, MD. It was created on  his behalf by Wilburn Mylar, a trained medical scribe. The creation of this record is based on the scribe's personal observations and the provider's statements to them. This document has been checked and approved by the attending provider.

## 2018-02-03 ENCOUNTER — Ambulatory Visit
Admission: RE | Admit: 2018-02-03 | Discharge: 2018-02-03 | Disposition: A | Discharge status: Home / Self Care | Payer: Medicare Other | Source: Ambulatory Visit | Attending: Radiation Oncology | Admitting: Radiation Oncology

## 2018-02-03 DIAGNOSIS — C7951 Secondary malignant neoplasm of bone: Secondary | ICD-10-CM | POA: Diagnosis not present

## 2018-02-04 ENCOUNTER — Other Ambulatory Visit: Payer: Self-pay | Admitting: Radiation Oncology

## 2018-02-04 ENCOUNTER — Ambulatory Visit
Admission: RE | Admit: 2018-02-04 | Discharge: 2018-02-04 | Disposition: A | Discharge status: Home / Self Care | Payer: Medicare Other | Source: Ambulatory Visit | Attending: Radiation Oncology | Admitting: Radiation Oncology

## 2018-02-04 DIAGNOSIS — C7951 Secondary malignant neoplasm of bone: Secondary | ICD-10-CM | POA: Diagnosis not present

## 2018-02-04 MED ORDER — HYDROCODONE-ACETAMINOPHEN 5-325 MG PO TABS: 1.0000 | ORAL_TABLET | Freq: Four times a day (QID) | ORAL | 0 refills | Status: AC | PRN

## 2018-02-04 MED ORDER — TRAMADOL HCL 50 MG PO TABS: 50.0000 mg | ORAL_TABLET | Freq: Four times a day (QID) | ORAL | 0 refills | Status: AC | PRN

## 2018-02-07 ENCOUNTER — Ambulatory Visit: Admission: RE | Admit: 2018-02-07 | Payer: Medicare Other | Source: Ambulatory Visit

## 2018-02-07 DIAGNOSIS — C7951 Secondary malignant neoplasm of bone: Secondary | ICD-10-CM | POA: Diagnosis not present

## 2018-02-08 ENCOUNTER — Ambulatory Visit: Payer: Medicare Other

## 2018-02-08 ENCOUNTER — Ambulatory Visit: Admission: RE | Admit: 2018-02-08 | Payer: Medicare Other | Source: Ambulatory Visit

## 2018-02-08 ENCOUNTER — Inpatient Hospital Stay: Payer: Medicare Other | Attending: Oncology

## 2018-02-08 ENCOUNTER — Inpatient Hospital Stay: Payer: Medicare Other

## 2018-02-08 DIAGNOSIS — Z95828 Presence of other vascular implants and grafts: Secondary | ICD-10-CM

## 2018-02-08 DIAGNOSIS — Z7982 Long term (current) use of aspirin: Secondary | ICD-10-CM | POA: Diagnosis not present

## 2018-02-08 DIAGNOSIS — C7951 Secondary malignant neoplasm of bone: Secondary | ICD-10-CM | POA: Insufficient documentation

## 2018-02-08 DIAGNOSIS — E291 Testicular hypofunction: Secondary | ICD-10-CM | POA: Diagnosis not present

## 2018-02-08 DIAGNOSIS — Z79899 Other long term (current) drug therapy: Secondary | ICD-10-CM | POA: Insufficient documentation

## 2018-02-08 DIAGNOSIS — C61 Malignant neoplasm of prostate: Secondary | ICD-10-CM

## 2018-02-08 LAB — CMP (CANCER CENTER ONLY)
ALT: 10 U/L (ref 0–44)
AST: 15 U/L (ref 15–41)
Albumin: 3.4 g/dL — ABNORMAL LOW (ref 3.5–5.0)
Alkaline Phosphatase: 79 U/L (ref 38–126)
Anion gap: 10 (ref 5–15)
BUN: 31 mg/dL — ABNORMAL HIGH (ref 8–23)
CHLORIDE: 102 mmol/L (ref 98–111)
CO2: 25 mmol/L (ref 22–32)
CREATININE: 1.03 mg/dL (ref 0.61–1.24)
Calcium: 9.3 mg/dL (ref 8.9–10.3)
GFR, Est AFR Am: >60 mL/min (ref >60)
Glucose, Bld: 103 mg/dL — ABNORMAL HIGH (ref 70–99)
Potassium: 4.1 mmol/L (ref 3.5–5.1)
Sodium: 137 mmol/L (ref 135–145)
Total Bilirubin: 0.6 mg/dL (ref 0.3–1.2)
Total Protein: 7.1 g/dL (ref 6.5–8.1)

## 2018-02-08 LAB — CBC WITH DIFFERENTIAL (CANCER CENTER ONLY)
Abs Immature Granulocytes: 0.07 10*3/uL (ref 0.00–0.07)
Basophils Absolute: 0 10*3/uL (ref 0.0–0.1)
Basophils Relative: 0 %
EOS PCT: 3 %
Eosinophils Absolute: 0.4 10*3/uL (ref 0.0–0.5)
HEMATOCRIT: 42.7 % (ref 39.0–52.0)
HEMOGLOBIN: 14.3 g/dL (ref 13.0–17.0)
Immature Granulocytes: 1 %
LYMPHS ABS: 1.1 10*3/uL (ref 0.7–4.0)
LYMPHS PCT: 10 %
MCH: 31.1 pg (ref 26.0–34.0)
MCHC: 33.5 g/dL (ref 30.0–36.0)
MCV: 92.8 fL (ref 80.0–100.0)
MONO ABS: 0.7 10*3/uL (ref 0.1–1.0)
MONOS PCT: 6 %
Neutro Abs: 8.8 10*3/uL — ABNORMAL HIGH (ref 1.7–7.7)
Neutrophils Relative %: 80 %
Platelet Count: 259 10*3/uL (ref 150–400)
RBC: 4.6 MIL/uL (ref 4.22–5.81)
RDW: 14 % (ref 11.5–15.5)
WBC: 11.2 10*3/uL — AB (ref 4.0–10.5)
nRBC: 0 % (ref 0.0–0.2)

## 2018-02-08 MED ORDER — HEPARIN SOD (PORK) LOCK FLUSH 100 UNIT/ML IV SOLN
500.0000 [IU] | Freq: Once | INTRAVENOUS | Status: AC | PRN
  Administered 2018-02-08: 500 [IU] via INTRAVENOUS
  Filled 2018-02-08: qty 5

## 2018-02-08 MED ORDER — SODIUM CHLORIDE 0.9 % IJ SOLN
10.0000 mL | INTRAMUSCULAR | Status: DC | PRN
  Administered 2018-02-08: 10 mL via INTRAVENOUS
  Filled 2018-02-08: qty 10

## 2018-02-09 ENCOUNTER — Ambulatory Visit: Payer: Medicare Other

## 2018-02-09 LAB — PROSTATE-SPECIFIC AG, SERUM (LABCORP): Prostate Specific Ag, Serum: 194 ng/mL — ABNORMAL HIGH (ref 0.0–4.0)

## 2018-02-10 ENCOUNTER — Telehealth: Payer: Self-pay

## 2018-02-10 ENCOUNTER — Ambulatory Visit: Payer: Medicare Other

## 2018-02-10 ENCOUNTER — Inpatient Hospital Stay (HOSPITAL_BASED_OUTPATIENT_CLINIC_OR_DEPARTMENT_OTHER): Payer: Medicare Other | Admitting: Oncology

## 2018-02-10 VITALS — BP 107/89 | HR 115 | Temp 97.7°F | Resp 18 | Ht 68.0 in | Wt 146.8 lb

## 2018-02-10 DIAGNOSIS — E291 Testicular hypofunction: Secondary | ICD-10-CM | POA: Diagnosis not present

## 2018-02-10 DIAGNOSIS — C61 Malignant neoplasm of prostate: Secondary | ICD-10-CM | POA: Diagnosis not present

## 2018-02-10 DIAGNOSIS — C7951 Secondary malignant neoplasm of bone: Secondary | ICD-10-CM

## 2018-02-10 NOTE — Telephone Encounter (Signed)
Printed avs and calender of upcoming appointment. Per 10/24 los 

## 2018-02-10 NOTE — Progress Notes (Signed)
Hematology and Oncology Follow Up Visit  Amonte Brookover [id number] May 28, 1940 77 y.o. 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    Prior Therapy:  He underwent a right VATS procedure and resection of the third, fourth and fifth rib and reconstruction with titanium plates under the care of Dr. Arlyce Dice on November 13 2010.  He S/P androgen deprivation therapy under the care of Dr. Gaynelle Arabian. His PSA nadir was to 0.1 back in May of 2013 and subsequently started to rise.  He was treated with Provenge in the fall of 2014.   [name] was started in February of 2015 and discontinued in 09/2013 due to progression of disease and poor tolerance.  Taxotere chemotherapy started on 10/05/2013. He is status post 16 cycles of therapy last one given in April 2016 with a PSA reduction down to 0.44.  Zytiga 1000 mg daily started in June 2017.  Therapy discontinued in December 2018 due to progression of disease.   Current therapy: Supportive management.  He is currently receiving radiation therapy to multiple locations including the left eighth vertebral level, ribs as well as T4 thoracic spine.  Interim History:  Mr. Rivest returns today for repeat evaluation.  Since her last visit, he has been experiencing further decline in his overall health.  He sustained a nondisplaced fracture of his hip which did not require any surgical intervention and was evaluated by Dr. Tammi Klippel for palliative radiation therapy.  In the interim, he has reported decline in his performance status and very limited mobility.  He is confined to a chair most of the day and barely able to stand or pivot.  His pain has been rather diffuse including pain in his skull, back and occasional hip.  He does take hydrocodone periodically for his pain which has been effective.  He also has Dilaudid which he rarely uses.  His performance status  is declining and his quality of life is poor.    He does not report any headaches or blurry vision or double vision.  He denies any lethargy or seizure.  He denies any fevers or chills or sweats.  He is not reporting any chest pain, palpitation or leg edema. Does not report any cough, hemoptysis or wheezing. Does not report any frequency urgency or hesitancy.  Denies any nausea, vomiting or abdominal pain.  He denies any constipation or diarrhea.  He denies any frequency urgency or hesitancy.  Does not report any lymphadenopathy or petechiae.  Does not report any skin rashes or ecchymosis.  He denies any lymphadenopathy or bruising.  He denies any mood changes.  Remainder of his review of systems is negative.   Medications: I have reviewed the patient's current medications.  Current Outpatient Medications  Medication Sig Dispense Refill  . acetaminophen (TYLENOL) 325 MG tablet Take 650 mg by mouth every 6 (six) hours as needed for moderate pain.     Marland Kitchen aspirin EC 81 MG tablet Take 81 mg by mouth every morning.     . Calcium Citrate-Vitamin D (CITRACAL + D PO) Take 2 tablets by mouth every morning.     . cholecalciferol (VITAMIN D) 1000 units tablet Take 1,000 Units by mouth every morning.     . ferrous sulfate 325 (65 FE) MG tablet Take 325 mg by mouth daily with breakfast.    . furosemide (LASIX) 20 MG tablet Take 20 mg by mouth.    Marland Kitchen HYDROcodone-acetaminophen (  NORCO/VICODIN) 5-325 MG tablet Take 1-2 tablets by mouth every 6 (six) hours as needed for moderate pain. 60 tablet 0  . ibuprofen (ADVIL,MOTRIN) 200 MG tablet Take 200 mg by mouth every 4 (four) hours as needed for moderate pain.    Marland Kitchen lidocaine-prilocaine (EMLA) cream Apply 1 application topically as needed. 30 g 2  . megestrol (MEGACE) 40 MG/ML suspension TAKE TWO TEASPOONFUL (10 ML) BY MOUTH TWICE DAILY 240 mL 0  . metoprolol succinate (TOPROL-XL) 25 MG 24 hr tablet Take 1 tablet (25 mg total) by mouth daily. 30 tablet 0  . pantoprazole  (PROTONIX) 40 MG tablet TAKE 1 TABLET BY MOUTH 30 MINUTES BEFORE BREAKFAST AND TAKE 1 TABLET BY MOUTH 30 MINUTES BEFORE SUPPER (Patient taking differently: Take 40 mg by mouth daily. ) 60 tablet 11  . potassium chloride SA (K-DUR,KLOR-CON) 20 MEQ tablet TAKE ONE TABLET BY MOUTH EVERY DAY (Patient taking differently: Take 20 mEq by mouth daily. ) 30 tablet 0  . silver sulfADIAZINE (SSD) 1 % cream SSD 1 % topical cream    . simvastatin (ZOCOR) 40 MG tablet Take 40 mg by mouth every evening. Reported on 08/14/2015    . traMADol (ULTRAM) 50 MG tablet Take 1 tablet (50 mg total) by mouth every 6 (six) hours as needed. 60 tablet 0   No current facility-administered medications for this visit.      Allergies:  Allergies  Allergen Reactions  . Oxycodone Nausea And Vomiting  . Penicillins Itching and Rash    Has patient had a PCN reaction causing immediate rash, facial/tongue/throat swelling, SOB or lightheadedness with hypotension: YES Has patient had a PCN reaction causing severe rash involving mucus membranes or skin necrosis: NO Has patient had a PCN reaction that required hospitalization: NO Has patient had a PCN reaction occurring within the last 10 years: NO If all of the above answers are "NO", then may proceed with Cephalosporin use.     Past Medical History, Surgical history, Social history, and Family History were reviewed and updated.   Physical Exam:  Blood pressure 107/89, pulse (!) 115, temperature 97.7 F (36.5 C), temperature source Oral, resp. rate 18, height 5\' 8"  (1.727 m), weight 146 lb 12.8 oz (66.6 kg), SpO2 97 %.    ECOG:  2   General appearance: Comfortable appearing appeared confused. Head: Normocephalic without any trauma Oropharynx: Mucous membranes are moist and pink without any thrush or ulcers. Eyes: Pupils are equal and round reactive to light. Lymph nodes: No cervical, supraclavicular, inguinal or axillary lymphadenopathy.   Heart:regular rate and  rhythm.  S1 and S2 without leg edema. Lung: Clear without any rhonchi or wheezes.  No dullness to percussion. Abdomin: Soft, nontender, nondistended with good bowel sounds.  No hepatosplenomegaly. Musculoskeletal: No joint deformity or effusion.  Full range of motion noted. Neurological: No deficits noted on motor, sensory and deep tendon reflex exam. Skin: No petechial rash or dryness.  Appeared moist.     Lab Results: Lab Results  Component Value Date   WBC 11.2 (H) 02/08/2018   HGB 14.3 02/08/2018   HCT 42.7 02/08/2018   MCV 92.8 02/08/2018   PLT 259 02/08/2018     Chemistry      Component Value Date/Time   NA 137 02/08/2018 1335   NA 140 04/09/2017 1505   K 4.1 02/08/2018 1335   K 4.1 04/09/2017 1505   CL 102 02/08/2018 1335   CO2 25 02/08/2018 1335   CO2 24 04/09/2017 1505  BUN 31 (H) 02/08/2018 1335   BUN 24.3 04/09/2017 1505   CREATININE 1.03 02/08/2018 1335   CREATININE 1.0 04/09/2017 1505      Component Value Date/Time   CALCIUM 9.3 02/08/2018 1335   CALCIUM 9.0 04/09/2017 1505   ALKPHOS 79 02/08/2018 1335   ALKPHOS 149 04/09/2017 1505   AST 15 02/08/2018 1335   AST 19 04/09/2017 1505   ALT 10 02/08/2018 1335   ALT 8 04/09/2017 1505   BILITOT 0.6 02/08/2018 1335   BILITOT 0.49 04/09/2017 1505       EXAM: NUCLEAR MEDICINE WHOLE BODY BONE SCAN  TECHNIQUE: Whole body anterior and posterior images were obtained approximately 3 hours after intravenous injection of radiopharmaceutical.  RADIOPHARMACEUTICALS:  21.7 mCi Technetium-33m MDP IV  COMPARISON:  Bone scan 05/24/2017 and 01/05/2017.  CT today.  FINDINGS: Compared with the previous study, there is new focal uptake at the calvarial vertex suspicious for metastatic disease. There is also increased uptake in the upper thoracic spine on the posterior images and near the left 8th costovertebral junction. There is also increased activity within the proximal right femur involving the greater  trochanteric region and proximal diaphysis. Activity in the greater trochanter corresponds with a nondisplaced fracture on today's pelvic CT. Activity in the right ischium and elsewhere in the pelvis appears unchanged. The previously demonstrated probable fracture involving the posterior right 9th rib has healed. There is indeterminate left supraclavicular and proximal right humeral activity on the anterior images.  IMPRESSION: 1. Nondisplaced fracture of the right greater trochanter. This demonstrates no definite pathologic features on today's CT. 2. Progressive metastatic disease, especially in the calvarium, upper thoracic spine and proximal right femoral diaphysis. Pelvic metastatic disease is grossly stable. 3. Interval healing of previously demonstrated posterior right rib fracture.   IMPRESSION: Progressive metastasis with soft tissue component involving the right inferior pubic ramus, as above.  Stable osseous metastases involving the bilateral iliac bones. Correlate with pending bone scan.  Stable compression fractures involving T12 and L2, as above.  Additional stable ancillary findings as above.    Impression and Plan:  77 year old man with:   1.  Castration-resistant advanced prostate cancer with documented disease to the bone.  His PSA continues to rise and disease has continues to progress with predominantly bone involvement without any visceral metastasis.  Options of therapy were reviewed again which include continue best supportive care, Xofigo or systemic chemotherapy.  His performance status has been rather poor and he is actively declining.  Given these findings, I do not believe he is a candidate for any aggressive therapy and I have recommended supportive care and hospice enrollment.  The rationale for hospice enrollment as well as discontinuation of any aggressive therapy was reviewed today.  His wife is in agreement given his overall general  decline.  No anticancer therapy will be prescribed moving forward.  2. Bone pain: Appears to be manageable at this time with very little hydrocodone.  He does have back pain and he has tolerated receiving radiation rather poorly for his thoracic spine.  He has a repeat evaluation with Dr. Tammi Klippel in the near future.  3. IV access: His Port-A-Cath continues to be flushed periodically without any issues.  4.  End-of-life care: He is approaching end-stage disease and his life expectancy is very limited.  I have suggested to not resuscitate CODE STATUS and his wife is in agreement.  5.  Prognosis: His disease is incurable with a limited life expectancy of less than 6 months.  6. Followup: Will be in 6 to 8 weeks to follow his progress.  25  minutes was spent with the patient face-to-face today.  More than 50% of time was dedicated to discussing the natural course of this disease, treatment options and discussing end-of-life issues and coordinating future plan of care.  Zola Button, MD  10/24/20193:10 PM

## 2018-02-11 ENCOUNTER — Ambulatory Visit: Payer: Medicare Other

## 2018-02-14 ENCOUNTER — Ambulatory Visit: Admission: RE | Admit: 2018-02-14 | Payer: Medicare Other | Source: Ambulatory Visit

## 2018-02-14 ENCOUNTER — Ambulatory Visit: Payer: Medicare Other

## 2018-02-14 ENCOUNTER — Ambulatory Visit
Admission: RE | Admit: 2018-02-14 | Discharge: 2018-02-14 | Disposition: A | Discharge status: Home / Self Care | Payer: Medicare Other | Source: Ambulatory Visit | Attending: Radiation Oncology | Admitting: Radiation Oncology

## 2018-02-14 DIAGNOSIS — C7951 Secondary malignant neoplasm of bone: Secondary | ICD-10-CM | POA: Diagnosis not present

## 2018-02-15 ENCOUNTER — Ambulatory Visit: Payer: Medicare Other

## 2018-02-16 ENCOUNTER — Ambulatory Visit: Payer: Medicare Other

## 2018-02-16 ENCOUNTER — Other Ambulatory Visit: Payer: Self-pay | Admitting: Oncology

## 2018-02-16 DIAGNOSIS — C61 Malignant neoplasm of prostate: Secondary | ICD-10-CM

## 2018-02-17 ENCOUNTER — Ambulatory Visit: Payer: Medicare Other

## 2018-02-18 ENCOUNTER — Ambulatory Visit: Payer: Medicare Other

## 2018-02-21 ENCOUNTER — Ambulatory Visit
Admission: RE | Admit: 2018-02-21 | Discharge: 2018-02-21 | Disposition: A | Discharge status: Home / Self Care | Payer: Medicare Other | Source: Ambulatory Visit | Attending: Radiation Oncology | Admitting: Radiation Oncology

## 2018-02-21 ENCOUNTER — Other Ambulatory Visit: Payer: Self-pay | Admitting: Oncology

## 2018-02-21 DIAGNOSIS — C7951 Secondary malignant neoplasm of bone: Secondary | ICD-10-CM | POA: Diagnosis not present

## 2018-02-21 DIAGNOSIS — C61 Malignant neoplasm of prostate: Secondary | ICD-10-CM

## 2018-02-21 DIAGNOSIS — Z510 Encounter for antineoplastic radiation therapy: Secondary | ICD-10-CM | POA: Insufficient documentation

## 2018-02-22 ENCOUNTER — Ambulatory Visit
Admission: RE | Admit: 2018-02-22 | Discharge: 2018-02-22 | Disposition: A | Discharge status: Home / Self Care | Payer: Medicare Other | Source: Ambulatory Visit | Attending: Radiation Oncology | Admitting: Radiation Oncology

## 2018-02-22 DIAGNOSIS — C7951 Secondary malignant neoplasm of bone: Secondary | ICD-10-CM | POA: Diagnosis not present

## 2018-02-23 ENCOUNTER — Ambulatory Visit
Admission: RE | Admit: 2018-02-23 | Discharge: 2018-02-23 | Disposition: A | Discharge status: Home / Self Care | Payer: Medicare Other | Source: Ambulatory Visit | Attending: Radiation Oncology | Admitting: Radiation Oncology

## 2018-02-23 DIAGNOSIS — C7951 Secondary malignant neoplasm of bone: Secondary | ICD-10-CM | POA: Diagnosis not present

## 2018-02-24 ENCOUNTER — Ambulatory Visit
Admission: RE | Admit: 2018-02-24 | Discharge: 2018-02-24 | Disposition: A | Discharge status: Home / Self Care | Payer: Medicare Other | Source: Ambulatory Visit | Attending: Radiation Oncology | Admitting: Radiation Oncology

## 2018-02-24 DIAGNOSIS — C7951 Secondary malignant neoplasm of bone: Secondary | ICD-10-CM | POA: Diagnosis not present

## 2018-03-08 ENCOUNTER — Ambulatory Visit: Payer: Self-pay | Admitting: Urology

## 2018-03-15 ENCOUNTER — Encounter: Payer: Self-pay | Admitting: Radiation Oncology

## 2018-03-15 NOTE — Progress Notes (Signed)
  Radiation Oncology         ([phone]) [phone] ________________________________  Name: [name] [name] MRN: [id number]  Date: 03/15/2018  DOB: 10-09-1940  End of Treatment Note  Diagnosis:   77 y.o. gentleman with castrate-resistant metastatic prostate cancer     Indication for treatment:  Palliative       Radiation treatment dates:    1. 01/24/18 - 02/04/18  2. 02/14/18, 02/21/18 - 02/24/18  Site/dose:    1. Pelvis, right hip / 30 Gy in 10 fractions 2. T7-T9 / 20 Gy in 5 fractions  Beams/energy:    1. 3D, photons / 10X, 15X 2. 3D, photons / 10X, 15X  Narrative: The patient tolerated radiation treatment relatively well. He reported improved pain to the radiation sites. His wife also reported fatigue, urinary incontinence, and constipation.    Plan: The patient has completed radiation treatment. The patient will return to radiation oncology clinic for routine followup in one month. I advised him to call or return sooner if he has any questions or concerns related to his recovery or treatment. ________________________________  Sheral Apley. Tammi Klippel, M.D.   This document serves as a record of services personally performed by Tyler Pita, MD. It was created on his behalf by Wilburn Mylar, a trained medical scribe. The creation of this record is based on the scribe's personal observations and the provider's statements to them. This document has been checked and approved by the attending provider.

## 2018-03-28 ENCOUNTER — Other Ambulatory Visit: Payer: Self-pay | Admitting: Oncology

## 2018-03-28 DIAGNOSIS — C61 Malignant neoplasm of prostate: Secondary | ICD-10-CM

## 2018-03-29 ENCOUNTER — Ambulatory Visit: Payer: Medicare Other | Admitting: Urology

## 2018-03-31 ENCOUNTER — Inpatient Hospital Stay: Payer: Medicare Other | Attending: Oncology | Admitting: Oncology

## 2018-03-31 ENCOUNTER — Inpatient Hospital Stay: Payer: Medicare Other

## 2018-03-31 ENCOUNTER — Telehealth: Payer: Self-pay | Admitting: Oncology

## 2018-03-31 VITALS — BP 111/88 | HR 113 | Temp 97.5°F | Resp 18 | Ht 68.0 in

## 2018-03-31 DIAGNOSIS — C7951 Secondary malignant neoplasm of bone: Secondary | ICD-10-CM | POA: Diagnosis not present

## 2018-03-31 DIAGNOSIS — C61 Malignant neoplasm of prostate: Secondary | ICD-10-CM

## 2018-03-31 DIAGNOSIS — Z79899 Other long term (current) drug therapy: Secondary | ICD-10-CM | POA: Diagnosis not present

## 2018-03-31 DIAGNOSIS — F0390 Unspecified dementia without behavioral disturbance: Secondary | ICD-10-CM | POA: Diagnosis not present

## 2018-03-31 DIAGNOSIS — Z923 Personal history of irradiation: Secondary | ICD-10-CM | POA: Diagnosis not present

## 2018-03-31 DIAGNOSIS — Z9221 Personal history of antineoplastic chemotherapy: Secondary | ICD-10-CM | POA: Diagnosis not present

## 2018-03-31 DIAGNOSIS — Z95828 Presence of other vascular implants and grafts: Secondary | ICD-10-CM

## 2018-03-31 DIAGNOSIS — Z7982 Long term (current) use of aspirin: Secondary | ICD-10-CM | POA: Insufficient documentation

## 2018-03-31 DIAGNOSIS — Z452 Encounter for adjustment and management of vascular access device: Secondary | ICD-10-CM | POA: Insufficient documentation

## 2018-03-31 LAB — CBC WITH DIFFERENTIAL (CANCER CENTER ONLY)
ABS IMMATURE GRANULOCYTES: 0.07 10*3/uL (ref 0.00–0.07)
BASOS ABS: 0.1 10*3/uL (ref 0.0–0.1)
BASOS PCT: 0 %
Eosinophils Absolute: 0.4 10*3/uL (ref 0.0–0.5)
Eosinophils Relative: 4 %
HCT: 46.6 % (ref 39.0–52.0)
HEMOGLOBIN: 15.4 g/dL (ref 13.0–17.0)
Immature Granulocytes: 1 %
LYMPHS PCT: 6 %
Lymphs Abs: 0.7 10*3/uL (ref 0.7–4.0)
MCH: 31.4 pg (ref 26.0–34.0)
MCHC: 33 g/dL (ref 30.0–36.0)
MCV: 95.1 fL (ref 80.0–100.0)
Monocytes Absolute: 0.7 10*3/uL (ref 0.1–1.0)
Monocytes Relative: 6 %
NRBC: 0 % (ref 0.0–0.2)
Neutro Abs: 9.7 10*3/uL — ABNORMAL HIGH (ref 1.7–7.7)
Neutrophils Relative %: 83 %
PLATELETS: 256 10*3/uL (ref 150–400)
RBC: 4.9 MIL/uL (ref 4.22–5.81)
RDW: 14.7 % (ref 11.5–15.5)
WBC: 11.6 10*3/uL — AB (ref 4.0–10.5)

## 2018-03-31 LAB — CMP (CANCER CENTER ONLY)
ALBUMIN: 3.4 g/dL — AB (ref 3.5–5.0)
ALT: 16 U/L (ref 0–44)
AST: 17 U/L (ref 15–41)
Alkaline Phosphatase: 100 U/L (ref 38–126)
Anion gap: 11 (ref 5–15)
BUN: 25 mg/dL — AB (ref 8–23)
CO2: 26 mmol/L (ref 22–32)
Calcium: 9.7 mg/dL (ref 8.9–10.3)
Chloride: 104 mmol/L (ref 98–111)
Creatinine: 0.81 mg/dL (ref 0.61–1.24)
Glucose, Bld: 134 mg/dL — ABNORMAL HIGH (ref 70–99)
Potassium: 4 mmol/L (ref 3.5–5.1)
Sodium: 141 mmol/L (ref 135–145)
Total Bilirubin: 0.6 mg/dL (ref 0.3–1.2)
Total Protein: 7.2 g/dL (ref 6.5–8.1)

## 2018-03-31 MED ORDER — HEPARIN SOD (PORK) LOCK FLUSH 100 UNIT/ML IV SOLN
500.0000 [IU] | Freq: Once | INTRAVENOUS | Status: AC | PRN
  Administered 2018-03-31: 500 [IU] via INTRAVENOUS
  Filled 2018-03-31: qty 5

## 2018-03-31 MED ORDER — SODIUM CHLORIDE 0.9% FLUSH
10.0000 mL | Freq: Once | INTRAVENOUS | Status: AC
  Administered 2018-03-31: 10 mL
  Filled 2018-03-31: qty 10

## 2018-03-31 NOTE — Telephone Encounter (Signed)
Gave patient avs report and appointments for February.  °

## 2018-03-31 NOTE — Progress Notes (Signed)
Hematology and Oncology Follow Up Visit  [name] [name] [id number] 1941-02-23 77 y.o. 03/31/2018 2:54 PM   Principle Diagnosis: 77 year old man with castration-resistant prostate cancer with visceral metastasis as well as disease to the bone documented in 2014 after initial diagnosis in 2012.     Prior Therapy:  He underwent a right VATS procedure and resection of the third, fourth and fifth rib and reconstruction with titanium plates under the care of Dr. Arlyce Dice on November 13 2010.  He S/P androgen deprivation therapy under the care of Dr. Gaynelle Arabian. His PSA nadir was to 0.1 back in May of 2013 and subsequently started to rise.  He was treated with Provenge in the fall of 2014.   [name] was started in February of 2015 and discontinued in 09/2013 due to progression of disease and poor tolerance.  Taxotere chemotherapy started on 10/05/2013. He is status post 16 cycles of therapy last one given in April 2016 with a PSA reduction down to 0.44.  Zytiga 1000 mg daily started in June 2017.  Therapy discontinued in December 2018 due to progression of disease.  He completed palliative radiation therapy to the pelvis, right hip as well as thoracic spine.  He received 30 Gy in 10 fractions to the right pelvis and 20 Gray in 5 fractions to T7 - T9.  Last treatment given on 02/24/2018.   Current therapy: Supportive management only.   Interim History:  [name] is here for a follow-up with his wife.  Since last visit, he completed radiation therapy in November and has tolerated it well.  He reports his pain is completely improved and is no longer taking any pain medication.  He did develop a worsening fatigue for the most part has been chair bound.  He is dependent fully on his wife for most activities of daily living.  She had difficulties transporting him today though so every time he has to leave the house.  His quality of life although poor is unchanged.  His appetite is reasonable at this  time.    He does not report any headaches or blurry vision or double vision.  He denies any alteration mental status or dizziness.  He denies any fevers or chills or sweats.  He is not reporting any chest pain, palpitation or leg edema. Does not report any cough, hemoptysis or wheezing. Does not report any frequency urgency or hesitancy.  Denies any nausea, vomiting or early satiety.  He denies any pains in bowel habits.  He denies any frequency urgency or hesitancy.  Does not report any leading or clotting tendency.  Does not report any adenopathy or ecchymosis. He denies any anxiety or depression.  Remainder of his review of systems is negative.   Medications: I have reviewed the patient's current medications.  Current Outpatient Medications  Medication Sig Dispense Refill  . acetaminophen (TYLENOL) 325 MG tablet Take 650 mg by mouth every 6 (six) hours as needed for moderate pain.     Marland Kitchen aspirin EC 81 MG tablet Take 81 mg by mouth every morning.     . Calcium Citrate-Vitamin D (CITRACAL + D PO) Take 2 tablets by mouth every morning.     . cholecalciferol (VITAMIN D) 1000 units tablet Take 1,000 Units by mouth every morning.     . ferrous sulfate 325 (65 FE) MG tablet Take 325 mg by mouth daily with breakfast.    . furosemide (LASIX) 20 MG tablet Take 20 mg by mouth.    Marland Kitchen HYDROcodone-acetaminophen (  NORCO/VICODIN) 5-325 MG tablet Take 1-2 tablets by mouth every 6 (six) hours as needed for moderate pain. 60 tablet 0  . ibuprofen (ADVIL,MOTRIN) 200 MG tablet Take 200 mg by mouth every 4 (four) hours as needed for moderate pain.    Marland Kitchen lidocaine-prilocaine (EMLA) cream Apply 1 application topically as needed. 30 g 2  . megestrol (MEGACE) 40 MG/ML suspension TAKE TWO TEASPOONFUL (10 ML) BY MOUTH TWICE DAILY 240 mL 0  . metoprolol succinate (TOPROL-XL) 25 MG 24 hr tablet Take 1 tablet (25 mg total) by mouth daily. 30 tablet 0  . pantoprazole (PROTONIX) 40 MG tablet TAKE 1 TABLET BY MOUTH 30 MINUTES  BEFORE BREAKFAST AND TAKE 1 TABLET BY MOUTH 30 MINUTES BEFORE SUPPER (Patient taking differently: Take 40 mg by mouth daily. ) 60 tablet 11  . potassium chloride SA (K-DUR,KLOR-CON) 20 MEQ tablet TAKE ONE TABLET BY MOUTH EVERY DAY (Patient taking differently: Take 20 mEq by mouth daily. ) 30 tablet 0  . silver sulfADIAZINE (SSD) 1 % cream SSD 1 % topical cream    . simvastatin (ZOCOR) 40 MG tablet Take 40 mg by mouth every evening. Reported on 08/14/2015    . traMADol (ULTRAM) 50 MG tablet Take 1 tablet (50 mg total) by mouth every 6 (six) hours as needed. 60 tablet 0   No current facility-administered medications for this visit.      Allergies:  Allergies  Allergen Reactions  . Oxycodone Nausea And Vomiting  . Penicillins Itching and Rash    Has patient had a PCN reaction causing immediate rash, facial/tongue/throat swelling, SOB or lightheadedness with hypotension: YES Has patient had a PCN reaction causing severe rash involving mucus membranes or skin necrosis: NO Has patient had a PCN reaction that required hospitalization: NO Has patient had a PCN reaction occurring within the last 10 years: NO If all of the above answers are "NO", then may proceed with Cephalosporin use.     Past Medical History, Surgical history, Social history, and Family History were reviewed and updated.   Physical Exam:  Blood pressure 111/88, pulse (!) 113, temperature (!) 97.5 F (36.4 C), temperature source Oral, resp. rate 18, height 5\' 8"  (1.727 m), SpO2 97 %.    ECOG:  3   General appearance:  Chronically ill-appearing gentleman in a wheelchair. Head: Atraumatic without abnormalities Oropharynx: Without any thrush or ulcers. Eyes: No scleral icterus. Lymph nodes: No lymphadenopathy noted in the cervical, supraclavicular, or axillary nodes Heart:regular rate and rhythm, without any murmurs or gallops.   Lung: Clear to auscultation without any rhonchi, wheezes or dullness to  percussion. Abdomin: Soft, nontender without any shifting dullness or ascites. Musculoskeletal: No clubbing or cyanosis. Neurological: No motor or sensory deficits. Skin: No rashes or lesions.     Lab Results: Lab Results  Component Value Date   WBC 11.6 (H) 03/31/2018   HGB 15.4 03/31/2018   HCT 46.6 03/31/2018   MCV 95.1 03/31/2018   PLT 256 03/31/2018     Chemistry      Component Value Date/Time   NA 137 02/08/2018 1335   NA 140 04/09/2017 1505   K 4.1 02/08/2018 1335   K 4.1 04/09/2017 1505   CL 102 02/08/2018 1335   CO2 25 02/08/2018 1335   CO2 24 04/09/2017 1505   BUN 31 (H) 02/08/2018 1335   BUN 24.3 04/09/2017 1505   CREATININE 1.03 02/08/2018 1335   CREATININE 1.0 04/09/2017 1505      Component Value Date/Time  CALCIUM 9.3 02/08/2018 1335   CALCIUM 9.0 04/09/2017 1505   ALKPHOS 79 02/08/2018 1335   ALKPHOS 149 04/09/2017 1505   AST 15 02/08/2018 1335   AST 19 04/09/2017 1505   ALT 10 02/08/2018 1335   ALT 8 04/09/2017 1505   BILITOT 0.6 02/08/2018 1335   BILITOT 0.49 04/09/2017 1505          Impression and Plan:  77 year old man with:   1.  Castration-resistant prostate cancer with known disease to the bone since 2014.    His PSA continues to rise and his performance status continues to decline.  Overall his disease is progressing and he is not a candidate for any additional therapy.  I recommended supportive management only and hospice enrollment in the future if needed.  The patient and his wife understand these findings and will defer the hospice enrollment for the time being.   2. Bone pain: resolved at this time after radiation therapy.  3. IV access: Port-A-Cath remains in place and will be flushed periodically.  4.  End-of-life care and prognosis: His prognosis is overall poor and dissipate limited life expectancy.  This is related to worsening dementia in addition to prostate cancer.  His wife seems to be managing his care at this time  but she understands that hospice may be needed in the future.  5. Followup: Will be in 2 months if he is able to make his appointment.  15  minutes was spent with the patient face-to-face today.  More than 50% of time was dedicated to reviewing laboratory data, his disease status, prognosis and treatment options.   Zola Button, MD  12/12/20192:54 PM

## 2018-04-01 LAB — PROSTATE-SPECIFIC AG, SERUM (LABCORP): PROSTATE SPECIFIC AG, SERUM: 139 ng/mL — AB (ref 0.0–4.0)

## 2018-06-03 ENCOUNTER — Telehealth: Payer: Self-pay | Admitting: *Deleted

## 2018-06-03 NOTE — Telephone Encounter (Signed)
Wife ellen calling. States patient has dementia and it has progressed. Patient sleeps 90 % of the time. He must be han fed, sometimes forgets to swallow. Wears a diaper. Is in no distress or pain. He is scheduled for lab /w port flush / dr Acquanetta Chain on 06/08/18.  wife ellen wants to know if she really needs to keep this appt? Patient is not under treatment currently. She may be reached at [phone]

## 2018-06-06 NOTE — Telephone Encounter (Signed)
Ok to cancel

## 2018-06-08 ENCOUNTER — Inpatient Hospital Stay: Payer: Medicare Other

## 2018-06-08 ENCOUNTER — Inpatient Hospital Stay: Payer: Medicare Other | Admitting: Oncology

## 2018-06-16 ENCOUNTER — Other Ambulatory Visit: Payer: Self-pay | Admitting: Oncology

## 2018-06-16 DIAGNOSIS — C61 Malignant neoplasm of prostate: Secondary | ICD-10-CM

## 2018-07-09 ENCOUNTER — Other Ambulatory Visit (INDEPENDENT_AMBULATORY_CARE_PROVIDER_SITE_OTHER): Payer: Self-pay | Admitting: Internal Medicine

## 2018-07-09 DIAGNOSIS — K219 Gastro-esophageal reflux disease without esophagitis: Secondary | ICD-10-CM

## 2018-10-10 ENCOUNTER — Ambulatory Visit (INDEPENDENT_AMBULATORY_CARE_PROVIDER_SITE_OTHER): Payer: Medicare Other | Admitting: Internal Medicine

## 2019-03-13 ENCOUNTER — Telehealth: Payer: Self-pay

## 2019-03-13 NOTE — Telephone Encounter (Signed)
Received call from [place] with [facility] requesting to speak with Dr. Alen Blew as patient is being pronounced at the scene. Call transferred to Dr. Alen Blew.

## 2019-03-21 DEATH — deceased

## 2019-06-15 ENCOUNTER — Telehealth: Payer: Self-pay | Admitting: *Deleted

## 2019-06-15 NOTE — Telephone Encounter (Signed)
On 06/15/19 fax medical records to [facility] [place] per [employer]

## 2020-04-28 IMAGING — CR DG FEMUR 2+V*R*
4 series · 4 of 4 positions shown · non-contrast
Comparison: None.

CLINICAL DATA: Right lower extremity pain

EXAM:
RIGHT FEMUR 2 VIEWS

[t femur proximal lat right]
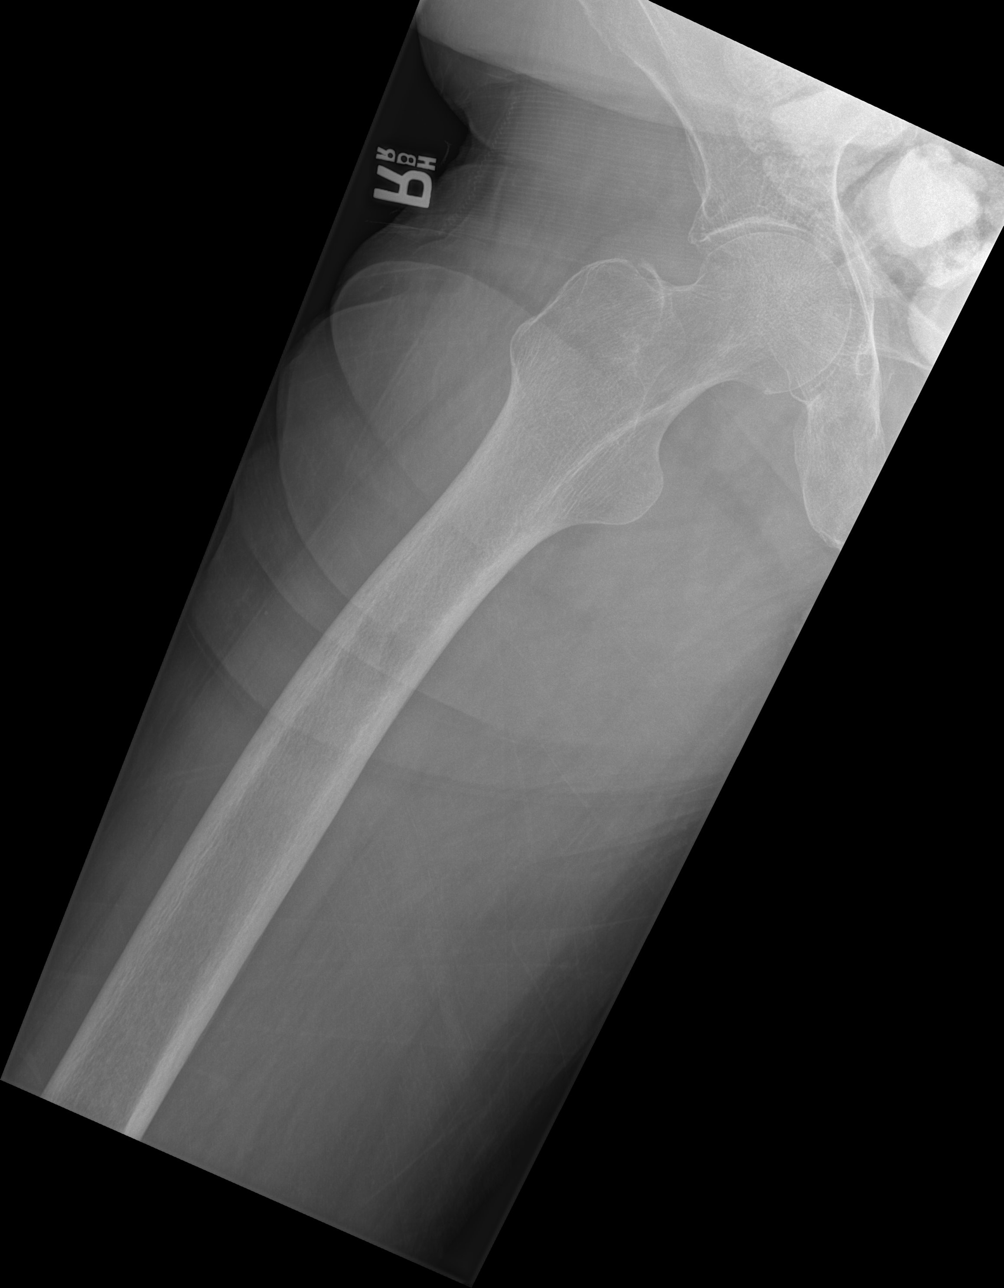

[t femur proximal ap right]
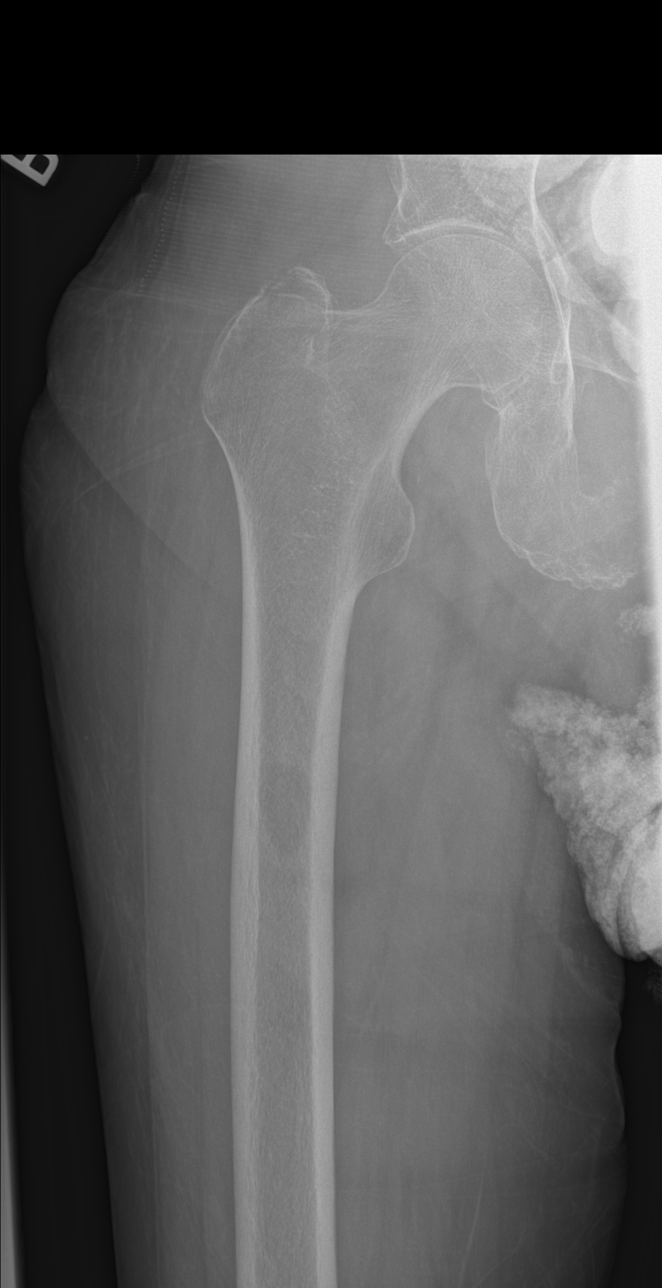

[t femur distal ap right]
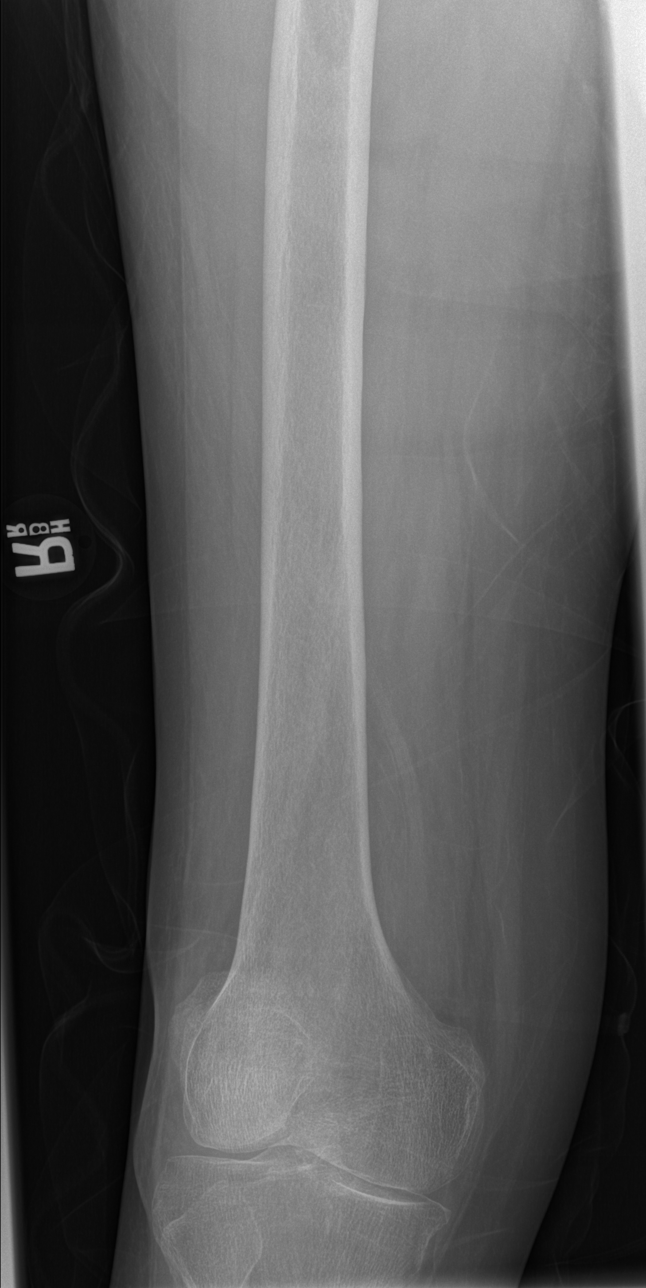

[x femur distal lat right]
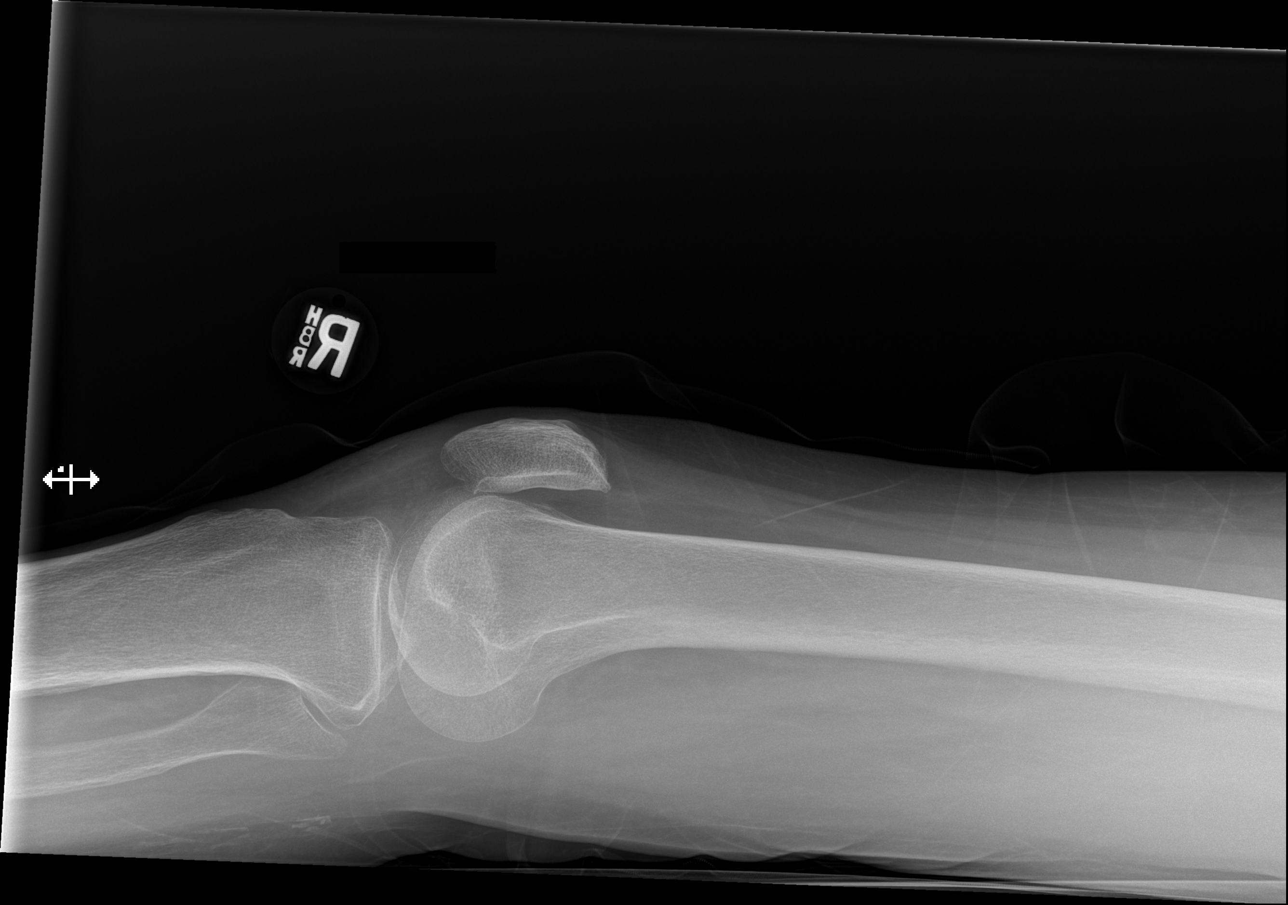

[4 of 4 positions shown; findings below may reference images not displayed]

FINDINGS: There is no femoral fracture. No dislocation at the right knee or
hip. No focal osseous lesions.
IMPRESSION: Normal right femur.

## 2020-04-28 IMAGING — CR DG HIP (WITH OR WITHOUT PELVIS) 2-3V*R*
3 series · 3 of 3 positions shown · non-contrast
Comparison: None.

CLINICAL DATA: Right hip pain

EXAM:
DG HIP (WITH OR WITHOUT PELVIS) 2-3V RIGHT

[t pelvis ap]
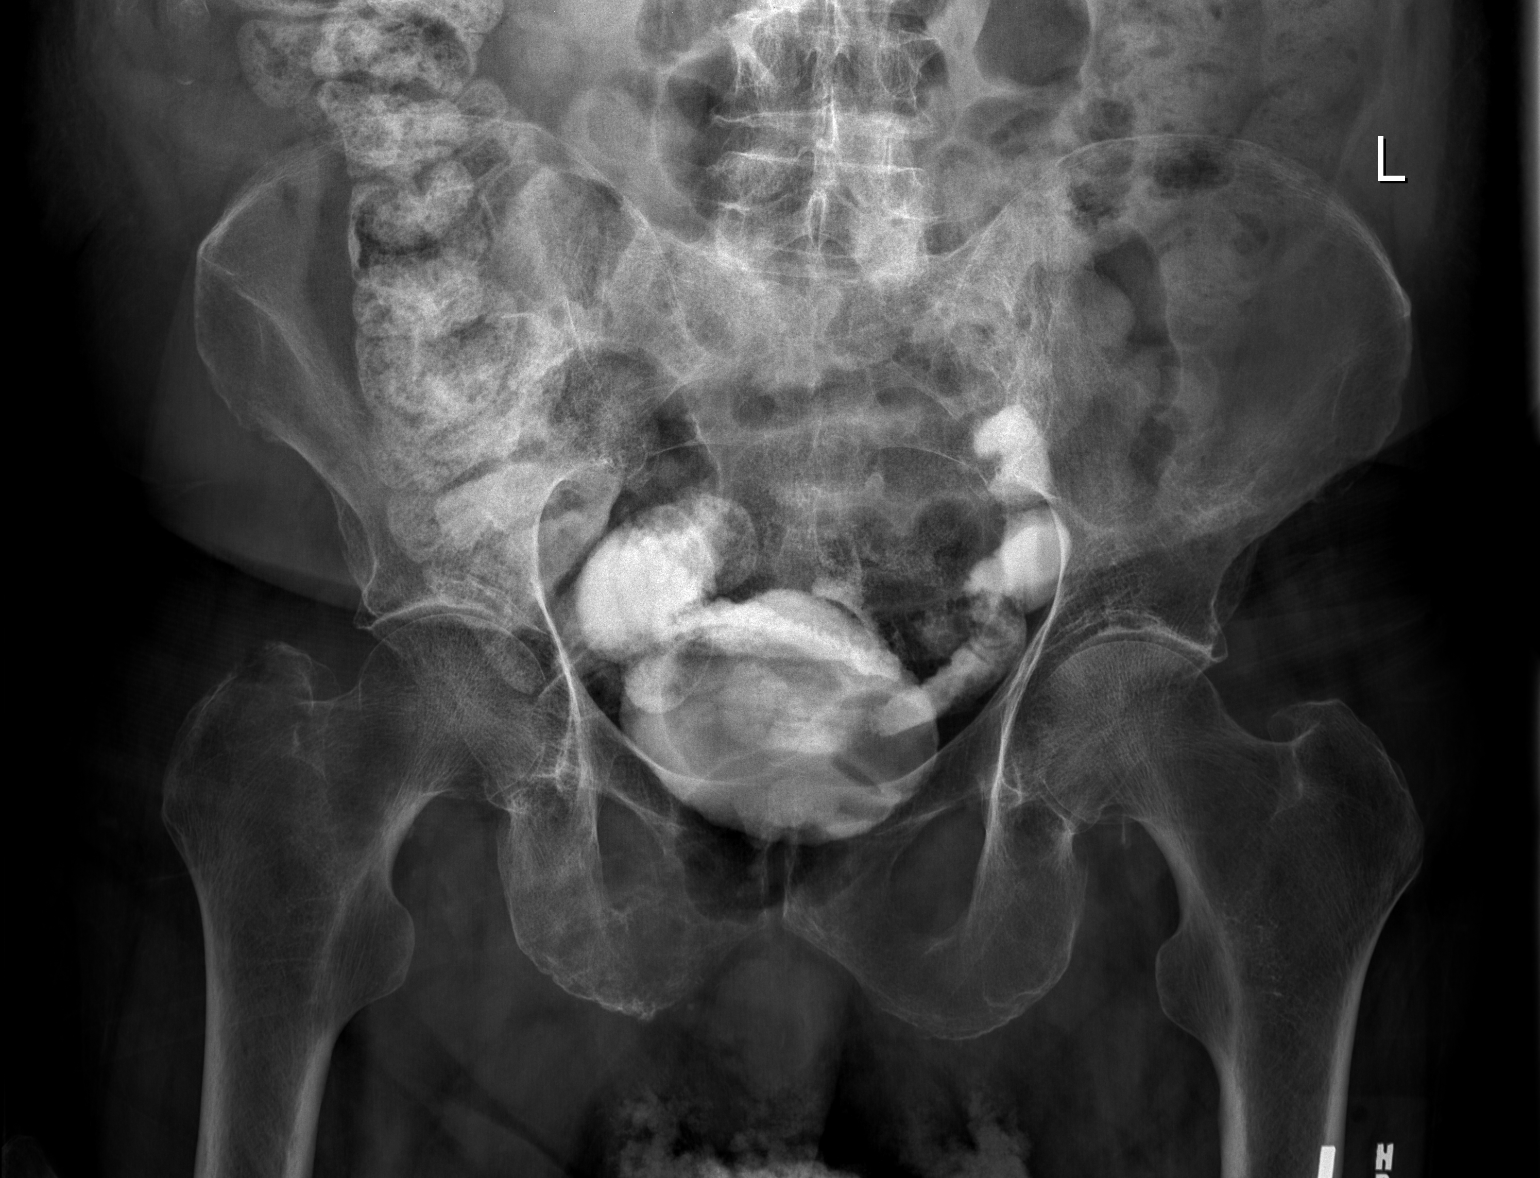

[t hip ap right]
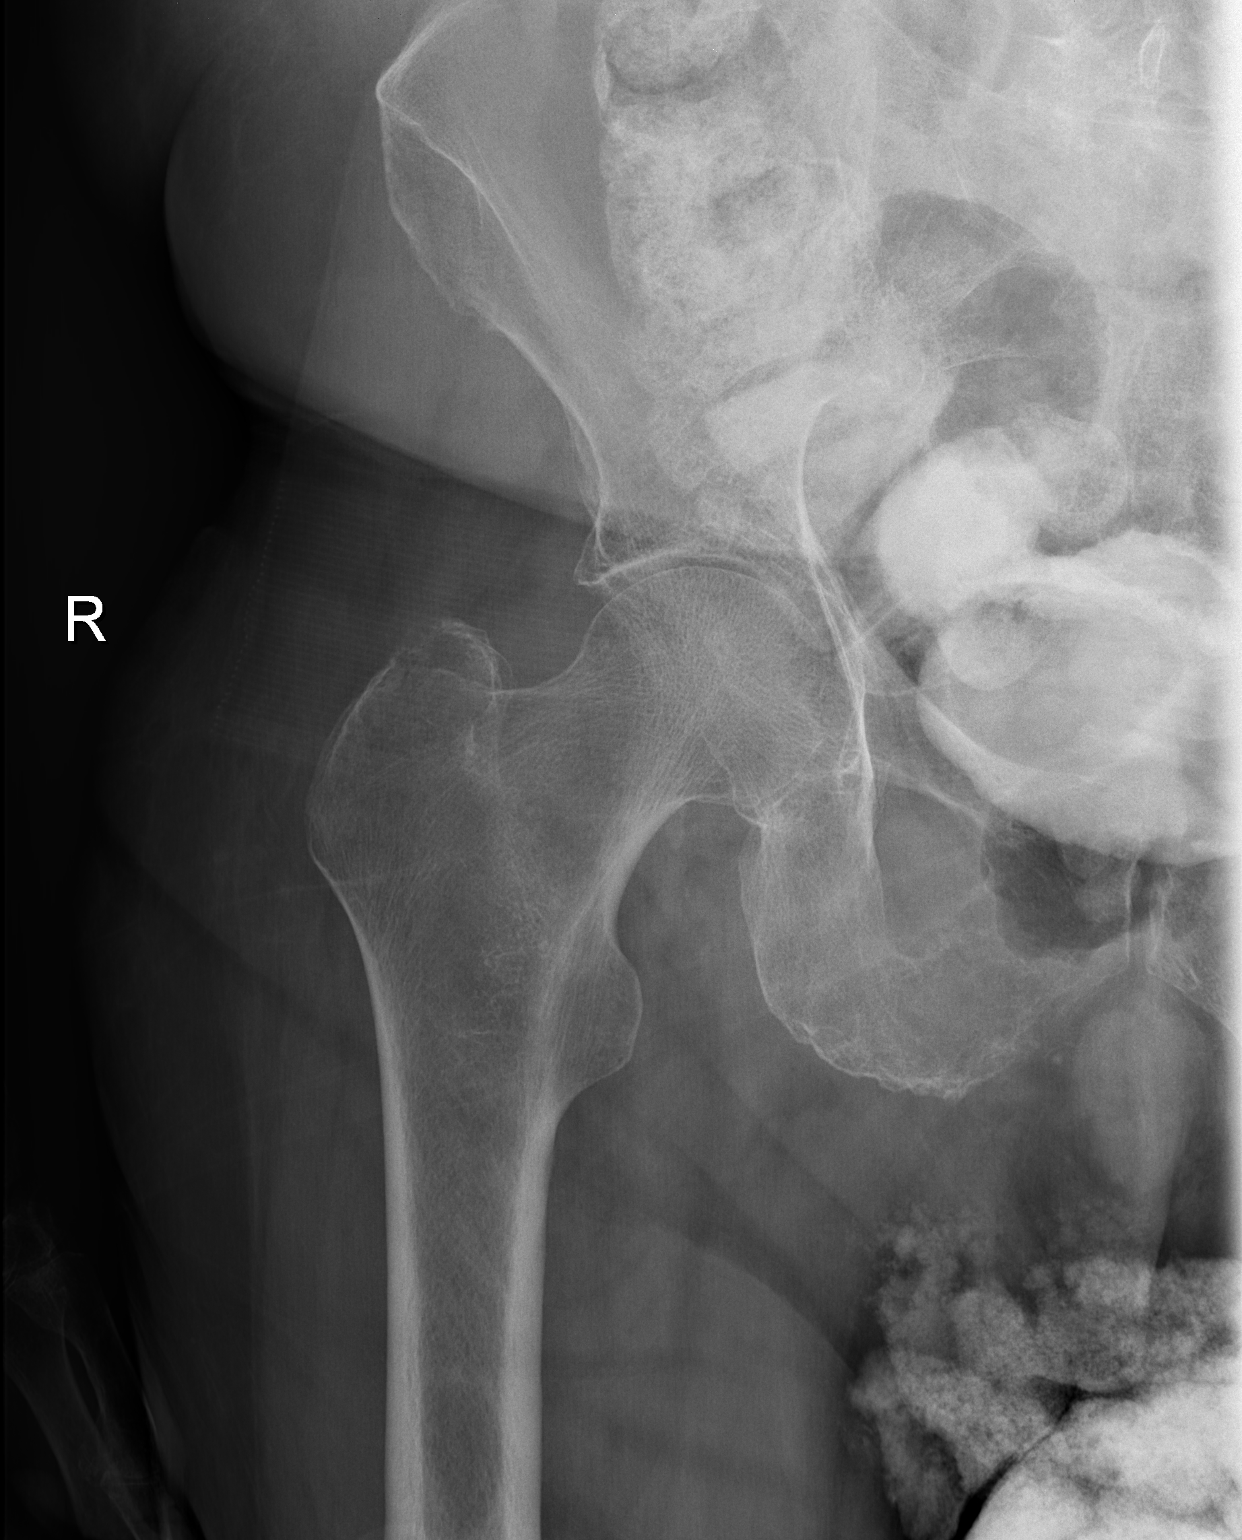

[t hip frog leg right]
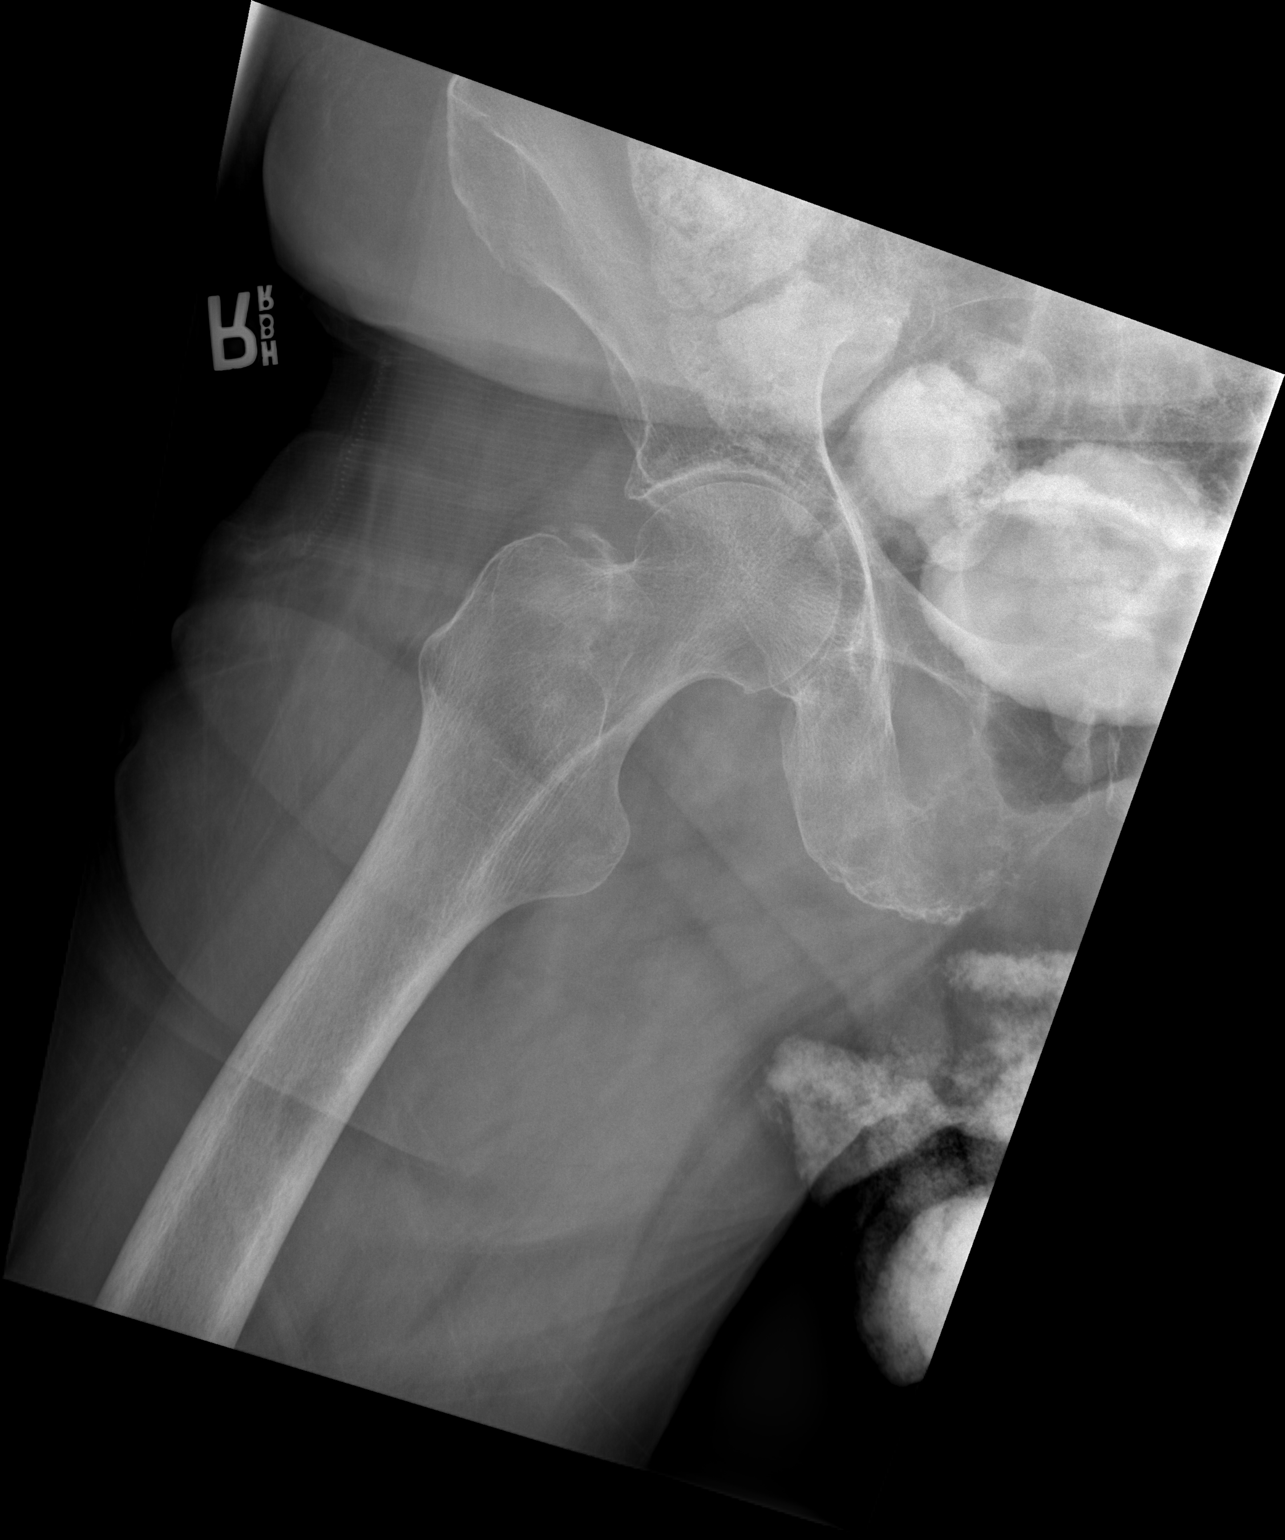

[3 of 3 positions shown; findings below may reference images not displayed]

FINDINGS: There is no evidence of hip fracture or dislocation. Mild narrowing
of both femoroacetabular joint spaces. There is dense contrast
material in the colon and urinary bladder.
IMPRESSION: Mild bilateral hip osteoarthrosis without fracture or dislocation.

## 2020-04-28 IMAGING — NM NM BONE WHOLE BODY
2 series · 2 of 2 positions shown · non-contrast
Comparison: Bone scan 05/24/2017 and 01/05/2017.  CT today.

CLINICAL DATA: 77-year-old with history of metastatic prostate
cancer.

EXAM:
NUCLEAR MEDICINE WHOLE BODY BONE SCAN
TECHNIQUE: Whole body anterior and posterior images were obtained approximately
3 hours after intravenous injection of radiopharmaceutical.
RADIOPHARMACEUTICALS:  21.7 mCi Rechnetium-CCm MDP IV

[Series 1: whole body · 2.66mm/px · 1 of 1 slices shown (1 of 2)]
[im 1/1]
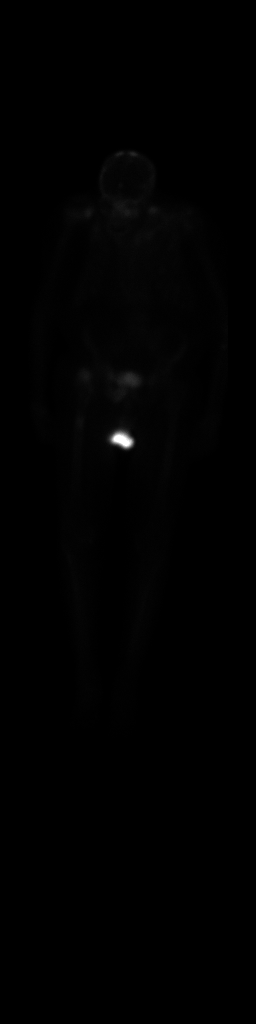

[Series 1: whole body · 2.66mm/px · 1 of 1 slices shown (2 of 2)]
[im 1/1]
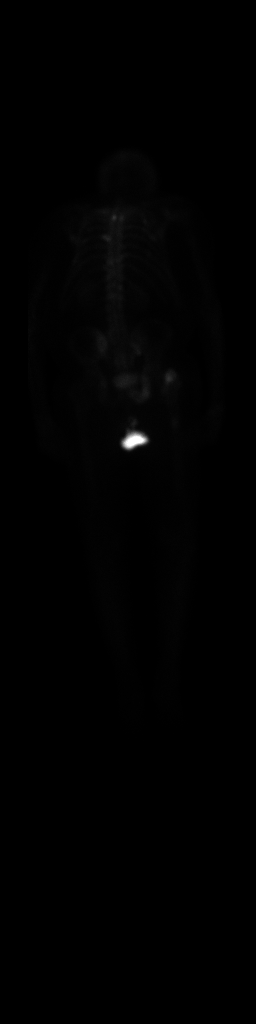

[2 of 2 positions shown; findings below may reference images not displayed]

FINDINGS: Compared with the previous study, there is new focal uptake at the
calvarial vertex suspicious for metastatic disease. There is also
increased uptake in the upper thoracic spine on the posterior images
and near the left 8th costovertebral junction. There is also
increased activity within the proximal right femur involving the
greater trochanteric region and proximal diaphysis. Activity in the
greater trochanter corresponds with a nondisplaced fracture on
today's pelvic CT. Activity in the right ischium and elsewhere in
the pelvis appears unchanged. The previously demonstrated probable
fracture involving the posterior right 9th rib has healed. There is
indeterminate left supraclavicular and proximal right humeral
activity on the anterior images.
IMPRESSION: 1. Nondisplaced fracture of the right greater trochanter. This
demonstrates no definite pathologic features on today's CT.
2. Progressive metastatic disease, especially in the calvarium,
upper thoracic spine and proximal right femoral diaphysis. Pelvic
metastatic disease is grossly stable.
3. Interval healing of previously demonstrated posterior right rib
fracture.
